# Patient Record
Sex: Female | Born: 1945 | Race: Black or African American | Hispanic: No | Marital: Single | Smoking: Former smoker
Health system: Southern US, Community
[De-identification: ages and names within clinical notes are randomized; demographics above are authoritative.]

## PROBLEM LIST (undated history)

## (undated) DIAGNOSIS — J309 Allergic rhinitis, unspecified: Secondary | ICD-10-CM

## (undated) DIAGNOSIS — D869 Sarcoidosis, unspecified: Secondary | ICD-10-CM

## (undated) DIAGNOSIS — K2961 Other gastritis with bleeding: Secondary | ICD-10-CM

## (undated) DIAGNOSIS — I82409 Acute embolism and thrombosis of unspecified deep veins of unspecified lower extremity: Secondary | ICD-10-CM

## (undated) DIAGNOSIS — E119 Type 2 diabetes mellitus without complications: Secondary | ICD-10-CM

## (undated) DIAGNOSIS — J90 Pleural effusion, not elsewhere classified: Secondary | ICD-10-CM

## (undated) DIAGNOSIS — F419 Anxiety disorder, unspecified: Secondary | ICD-10-CM

## (undated) DIAGNOSIS — G629 Polyneuropathy, unspecified: Secondary | ICD-10-CM

## (undated) DIAGNOSIS — I10 Essential (primary) hypertension: Secondary | ICD-10-CM

## (undated) DIAGNOSIS — D8689 Sarcoidosis of other sites: Secondary | ICD-10-CM

## (undated) HISTORY — PX: ABDOMINAL HYSTERECTOMY: SHX81

## (undated) HISTORY — DX: Allergic rhinitis, unspecified: J30.9

## (undated) HISTORY — DX: Pleural effusion, not elsewhere classified: J90

## (undated) HISTORY — PX: COLONOSCOPY: SHX174

## (undated) HISTORY — DX: Sarcoidosis of other sites: D86.89

## (undated) HISTORY — DX: Polyneuropathy, unspecified: G62.9

## (undated) HISTORY — PX: ESOPHAGOGASTRODUODENOSCOPY: SHX1529

## (undated) HISTORY — DX: Sarcoidosis, unspecified: D86.9

## (undated) HISTORY — DX: Acute embolism and thrombosis of unspecified deep veins of unspecified lower extremity: I82.409

---

## 1973-06-19 HISTORY — PX: CHOLECYSTECTOMY: SHX55

## 1999-09-14 ENCOUNTER — Ambulatory Visit (HOSPITAL_BASED_OUTPATIENT_CLINIC_OR_DEPARTMENT_OTHER): Admission: RE | Admit: 1999-09-14 | Discharge: 1999-09-14 | Payer: Self-pay | Admitting: Urology

## 1999-09-14 ENCOUNTER — Encounter (INDEPENDENT_AMBULATORY_CARE_PROVIDER_SITE_OTHER): Payer: Self-pay | Admitting: *Deleted

## 2000-03-14 ENCOUNTER — Other Ambulatory Visit: Admission: RE | Admit: 2000-03-14 | Discharge: 2000-03-14 | Payer: Self-pay | Admitting: Radiology

## 2000-03-14 ENCOUNTER — Encounter (INDEPENDENT_AMBULATORY_CARE_PROVIDER_SITE_OTHER): Payer: Self-pay | Admitting: Specialist

## 2001-06-06 ENCOUNTER — Encounter: Admission: RE | Admit: 2001-06-06 | Discharge: 2001-06-06 | Payer: Self-pay | Admitting: Internal Medicine

## 2001-06-06 ENCOUNTER — Encounter: Payer: Self-pay | Admitting: Internal Medicine

## 2001-07-23 ENCOUNTER — Encounter: Admission: RE | Admit: 2001-07-23 | Discharge: 2001-07-23 | Payer: Self-pay | Admitting: Internal Medicine

## 2001-07-23 ENCOUNTER — Encounter: Payer: Self-pay | Admitting: Internal Medicine

## 2001-07-24 ENCOUNTER — Encounter (INDEPENDENT_AMBULATORY_CARE_PROVIDER_SITE_OTHER): Payer: Self-pay | Admitting: Specialist

## 2001-07-24 ENCOUNTER — Encounter (INDEPENDENT_AMBULATORY_CARE_PROVIDER_SITE_OTHER): Payer: Self-pay | Admitting: *Deleted

## 2001-07-24 ENCOUNTER — Inpatient Hospital Stay (HOSPITAL_COMMUNITY): Admission: AD | Admit: 2001-07-24 | Discharge: 2001-08-06 | Payer: Self-pay | Admitting: Internal Medicine

## 2001-07-25 ENCOUNTER — Encounter: Payer: Self-pay | Admitting: Internal Medicine

## 2001-07-27 ENCOUNTER — Encounter: Payer: Self-pay | Admitting: Internal Medicine

## 2001-07-30 ENCOUNTER — Encounter: Payer: Self-pay | Admitting: Internal Medicine

## 2001-07-31 ENCOUNTER — Encounter: Payer: Self-pay | Admitting: Internal Medicine

## 2001-08-05 ENCOUNTER — Encounter: Payer: Self-pay | Admitting: Internal Medicine

## 2001-10-03 ENCOUNTER — Inpatient Hospital Stay (HOSPITAL_COMMUNITY): Admission: EM | Admit: 2001-10-03 | Discharge: 2001-10-07 | Payer: Self-pay | Admitting: Emergency Medicine

## 2001-10-03 ENCOUNTER — Encounter: Payer: Self-pay | Admitting: Internal Medicine

## 2001-10-04 ENCOUNTER — Encounter: Payer: Self-pay | Admitting: Internal Medicine

## 2002-11-11 ENCOUNTER — Ambulatory Visit (HOSPITAL_COMMUNITY): Admission: RE | Admit: 2002-11-11 | Discharge: 2002-11-11 | Payer: Self-pay | Admitting: Internal Medicine

## 2002-12-26 ENCOUNTER — Encounter: Payer: Self-pay | Admitting: Internal Medicine

## 2002-12-26 ENCOUNTER — Ambulatory Visit (HOSPITAL_COMMUNITY): Admission: RE | Admit: 2002-12-26 | Discharge: 2002-12-26 | Payer: Self-pay | Admitting: *Deleted

## 2004-09-13 ENCOUNTER — Ambulatory Visit: Payer: Self-pay | Admitting: Internal Medicine

## 2004-11-08 ENCOUNTER — Ambulatory Visit: Payer: Self-pay | Admitting: Internal Medicine

## 2005-03-22 ENCOUNTER — Inpatient Hospital Stay (HOSPITAL_COMMUNITY): Admission: AD | Admit: 2005-03-22 | Discharge: 2005-03-26 | Payer: Self-pay | Admitting: Internal Medicine

## 2005-04-07 ENCOUNTER — Ambulatory Visit: Payer: Self-pay | Admitting: Internal Medicine

## 2005-04-18 ENCOUNTER — Ambulatory Visit: Payer: Self-pay | Admitting: Cardiology

## 2005-05-09 ENCOUNTER — Ambulatory Visit: Payer: Self-pay | Admitting: Internal Medicine

## 2005-07-12 ENCOUNTER — Ambulatory Visit: Payer: Self-pay | Admitting: Internal Medicine

## 2005-07-21 ENCOUNTER — Ambulatory Visit: Payer: Self-pay | Admitting: Internal Medicine

## 2005-11-06 ENCOUNTER — Ambulatory Visit: Payer: Self-pay | Admitting: Internal Medicine

## 2006-05-07 ENCOUNTER — Ambulatory Visit: Payer: Self-pay | Admitting: Internal Medicine

## 2006-05-28 ENCOUNTER — Ambulatory Visit: Payer: Self-pay | Admitting: Cardiology

## 2006-05-30 ENCOUNTER — Ambulatory Visit: Payer: Self-pay

## 2006-06-05 ENCOUNTER — Ambulatory Visit: Payer: Self-pay

## 2006-06-27 ENCOUNTER — Ambulatory Visit: Payer: Self-pay | Admitting: Cardiology

## 2006-07-02 ENCOUNTER — Ambulatory Visit: Payer: Self-pay | Admitting: Internal Medicine

## 2006-08-03 ENCOUNTER — Emergency Department (HOSPITAL_COMMUNITY): Admission: EM | Admit: 2006-08-03 | Discharge: 2006-08-04 | Payer: Self-pay | Admitting: Emergency Medicine

## 2006-08-13 ENCOUNTER — Ambulatory Visit: Payer: Self-pay | Admitting: Internal Medicine

## 2006-10-08 ENCOUNTER — Ambulatory Visit: Payer: Self-pay | Admitting: Internal Medicine

## 2006-11-20 ENCOUNTER — Ambulatory Visit: Payer: Self-pay | Admitting: Internal Medicine

## 2007-03-15 ENCOUNTER — Ambulatory Visit: Payer: Self-pay | Admitting: Internal Medicine

## 2007-04-19 ENCOUNTER — Ambulatory Visit: Payer: Self-pay | Admitting: Internal Medicine

## 2007-07-18 DIAGNOSIS — Z86718 Personal history of other venous thrombosis and embolism: Secondary | ICD-10-CM | POA: Insufficient documentation

## 2007-07-18 DIAGNOSIS — J3089 Other allergic rhinitis: Secondary | ICD-10-CM

## 2007-07-18 DIAGNOSIS — D869 Sarcoidosis, unspecified: Secondary | ICD-10-CM | POA: Insufficient documentation

## 2007-07-18 DIAGNOSIS — G609 Hereditary and idiopathic neuropathy, unspecified: Secondary | ICD-10-CM | POA: Insufficient documentation

## 2007-07-18 DIAGNOSIS — I82409 Acute embolism and thrombosis of unspecified deep veins of unspecified lower extremity: Secondary | ICD-10-CM | POA: Insufficient documentation

## 2007-07-18 DIAGNOSIS — J302 Other seasonal allergic rhinitis: Secondary | ICD-10-CM | POA: Insufficient documentation

## 2007-07-18 DIAGNOSIS — J90 Pleural effusion, not elsewhere classified: Secondary | ICD-10-CM | POA: Insufficient documentation

## 2007-07-19 ENCOUNTER — Ambulatory Visit: Payer: Self-pay | Admitting: Internal Medicine

## 2007-09-03 ENCOUNTER — Ambulatory Visit: Payer: Self-pay | Admitting: Internal Medicine

## 2007-09-04 LAB — CONVERTED CEMR LAB
BUN: 10 mg/dL (ref 6–23)
Basophils Absolute: 0 10*3/uL (ref 0.0–0.1)
Basophils Relative: 0.8 % (ref 0.0–1.0)
CO2: 31 meq/L (ref 19–32)
Calcium: 9.2 mg/dL (ref 8.4–10.5)
Chloride: 99 meq/L (ref 96–112)
Cortisol, Plasma: 9.2 ug/dL
Creatinine, Ser: 0.8 mg/dL (ref 0.4–1.2)
Eosinophils Absolute: 0.3 10*3/uL (ref 0.0–0.6)
Eosinophils Relative: 4.9 % (ref 0.0–5.0)
GFR calc Af Amer: 93 mL/min
GFR calc non Af Amer: 77 mL/min
Glucose, Bld: 129 mg/dL — ABNORMAL HIGH (ref 70–99)
HCT: 36 % (ref 36.0–46.0)
Hemoglobin: 11.7 g/dL — ABNORMAL LOW (ref 12.0–15.0)
Lymphocytes Relative: 26.4 % (ref 12.0–46.0)
MCHC: 32.6 g/dL (ref 30.0–36.0)
MCV: 85.8 fL (ref 78.0–100.0)
Monocytes Absolute: 0.5 10*3/uL (ref 0.2–0.7)
Monocytes Relative: 9.2 % (ref 3.0–11.0)
Neutro Abs: 3.2 10*3/uL (ref 1.4–7.7)
Neutrophils Relative %: 58.7 % (ref 43.0–77.0)
Platelets: 372 10*3/uL (ref 150–400)
Potassium: 4 meq/L (ref 3.5–5.1)
RBC: 4.2 M/uL (ref 3.87–5.11)
RDW: 12.8 % (ref 11.5–14.6)
Sodium: 136 meq/L (ref 135–145)
WBC: 5.5 10*3/uL (ref 4.5–10.5)

## 2007-11-12 ENCOUNTER — Ambulatory Visit: Payer: Self-pay | Admitting: Internal Medicine

## 2007-11-20 ENCOUNTER — Telehealth (INDEPENDENT_AMBULATORY_CARE_PROVIDER_SITE_OTHER): Payer: Self-pay | Admitting: *Deleted

## 2008-01-03 ENCOUNTER — Ambulatory Visit: Payer: Self-pay | Admitting: Internal Medicine

## 2008-05-05 ENCOUNTER — Ambulatory Visit: Payer: Self-pay | Admitting: Internal Medicine

## 2008-11-03 ENCOUNTER — Ambulatory Visit: Payer: Self-pay | Admitting: Internal Medicine

## 2009-03-23 ENCOUNTER — Telehealth (INDEPENDENT_AMBULATORY_CARE_PROVIDER_SITE_OTHER): Payer: Self-pay | Admitting: *Deleted

## 2009-08-24 ENCOUNTER — Telehealth: Payer: Self-pay | Admitting: Internal Medicine

## 2009-09-09 ENCOUNTER — Telehealth (INDEPENDENT_AMBULATORY_CARE_PROVIDER_SITE_OTHER): Payer: Self-pay | Admitting: *Deleted

## 2009-11-03 ENCOUNTER — Ambulatory Visit: Payer: Self-pay | Admitting: Internal Medicine

## 2010-07-21 NOTE — Assessment & Plan Note (Signed)
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  Rhinitis.  She had a bronchitis syndrome.  Last visit.  Chest x-ray showed increased bibasilar effusions and airspace disease.  We gave her prednisone taper, starting June 3, lasting 10 days.  She feels much better.  Having completed that.  She denies any significant cough, chest pain, fever, sputum production, adenopathy, or rash.  05/05/08- Atypical sarcoid, rhinitis, hx pleural effusion Sensitive to weather but doing ok, feels well with no new problems. Off prednisone. We discussed Flu vaccines. Little cough, no wheeze, fever, phlegm or pain.  11/03/08- Atypical sarcoid, rhinits, hx pleural effusion. Notices some dyspnea with current humidity, but doing much better. Rarely needs rescue inhaler. Ophthalmologist is watching eye pressures closely. allegra as needed. Still gets sweats but denies fever, chills, nodes, cough, wheeze, chest pain, palpitation. Left foot swells a little sometimes.  Nov 03, 2009- Atypical sarcoid, rhinitis, hx pleural effusion Took Z pak, then 5 days of prednisone in March for chest congestion. She says these cleared her nicely. She thinks this was a bad cold, now resolved. Uses Proair about twice daily. She c/o rhinorhea and nasal congestion. Has used allegra, now interested in trying Clarinex. Keeps sweats chronically several times per day. Not much cough. Stable exertional dyspnea, no chest pain, no sputum, no wheeze. Feet no longer swell, but  still gets pains in lower legs and muscle twitches. She can walk.      Current Medications (verified): 1)  Cardizem La 180 Mg  Tb24 (Diltiazem Hcl Coated Beads) .... Take 1 By Mouth Once Daily 2)  Proair Hfa 108 (90 Base) Mcg/act Aers (Albuterol Sulfate) .... 2 Puffs Every 4 Hours As Needed 3)  Glucophage 500 Mg  Tabs (Metformin Hcl) .... Take 1 Tablet By Mouth Once A Day 4)  Prozac 10 Mg  Caps (Fluoxetine Hcl) .... Take 1 Tablet By Mouth Once A Day 5)  Fexofenadine Hcl 60 Mg  Tabs (Fexofenadine Hcl) .... Take 1 Tablet By Mouth Two Times A Day As Needed For Alleriges 6)  Clonidine Hcl 0.1 Mg  Tabs (Clonidine Hcl) .... Once Daily 7)  Calcium 500/vitamin D 500-125 Mg-Unit  Tabs (Calcium Carbonate-Vitamin D) .... Once Daily  Allergies (verified): 1)  ! 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, volume loss and effusion- bronchoscopy 2003 Peripheral neuropathy Positive PPD rx'd 2003 4 drugs by [facility] until all bronch afb studies returned negative DVT Right leg 2006 rhinosinusitis erythema nodosum 2008  Past Surgical History: Last updated: 07/19/2007 Cholecystectomy Hysterectomy C-section ruptured ovarian cyst  Family History: Last updated: 11/12/2007 MI/Heart Attack- father age 4 deceased CVA- mother age 26 stroke sister- 56 yrs- MS  Social History: Last updated: 11/12/2007 Patient states former smoker few cigs a day for 10 years  retired separated with 1 child  Risk Factors: Smoking Status: quit (07/19/2007)  Review of Systems      See HPI       The patient complains of shortness of breath with activity, non-productive cough, nasal congestion/difficulty breathing through nose, and sneezing.  The  patient denies shortness of breath at rest, productive cough, coughing up blood, chest pain, irregular heartbeats, acid heartburn, indigestion, loss of appetite,  weight change, abdominal pain, difficulty swallowing, sore throat, tooth/dental problems, and headaches.    Vital Signs:  Patient profile:   65 year old female Height:      63 inches Weight:      151 pounds BMI:     26.85 O2 Sat:      98 % on Room air Pulse rate:   66 / minute BP sitting:   110 / 78  (left arm) Cuff size:   regular  Vitals Entered By: Reynaldo Minium CMA (Nov 03, 2009 9:14 AM)  O2 Flow:  Room air  Physical Exam  Additional Exam:  General: A/Ox3; pleasant and cooperative, NAD, SKIN: no rash, lesions NODES: no lymphadenopathy HEENT: [place]/AT, EOM- WNL, Conjuctivae- clear, PERRLA, TM-WNL, Nose- clear, Throat- clear and wnl, Mallampati  II NECK: Supple w/ fair ROM, JVD- none, normal carotid impulses w/o bruits Thyroid-  CHEST: dull and shallow lung bases, without rales, rub, rhonchi.- unchanged from last exam. 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 She has stable chronic scarring bibasilar lung changes, but the inflammatorry component is no longer seen. She did have an acute bronchitis this Spring, c/w viral and now resolved. She does still find Proair helpful. We discussed use- she is borderline for needing a maintenance inhaler.  Problem # 2:  ALLERGIC RHINITIS (ICD-477.9)  She wants to see if she can do better with another antihistamine as Spring pollen rhinitis continues. We will try Clarinex. Her updated medication list for this problem includes:    Clarinex Reditabs 5 Mg Tbdp (Desloratadine) .Marland Kitchen... 1 daily as needed allergy  Medications Added to Medication List This Visit: 1)  Clarinex Reditabs 5 Mg Tbdp (Desloratadine) .Marland Kitchen.. 1 daily as needed allergy  Other Orders: Est. Patient Level III (45409) Prescription Created Electronically [phone])  Patient Instructions: 1)  Please schedule a  follow-up appointment in 1 year. Call sooner as needed. 2)  Script for Clarinex antihistamine sent to your drug store. 3)  Call for Proair renewal as needed. Prescriptions: CLARINEX REDITABS 5 MG TBDP (DESLORATADINE) 1 daily as needed allergy  #30 x prn   Entered and Authorized by:   Waymon Budge MD   Signed by:   Waymon Budge MD on 11/03/2009   Method used:   Electronically to        [id number] (retail)       [address]       [place], [place]  [address]       Ph: [phone] or [phone]       Fax: [phone]   RxID:   [id number]

## 2010-07-21 NOTE — Progress Notes (Signed)
Summary: congestion/ sob  Phone Note Call from Patient   Caller: Patient Call For: young Summary of Call: pt completed z pak still have chest congestion and sob would like to see dr young Initial call taken by: Rickard Patience,  September 09, 2009 10:03 AM  Follow-up for Phone Call        called and spoke with pt. pt was recently  given a z pak 08-24-2009 which she has finished.  Pt states she is also taking Mucinex.  Pt states she is overall feeling better but still c/o chest congestion, occ coughing up clear sputum, "rattling in chest" and difficulty taking a deep breath in.  Pt denied increased sob and  fever.  Pt requesting to be seen.  No appts avail.  Please advise.  Thanks.  Aundra Millet Reynolds LPN  September 09, 2009 11:39 AM    allergies: codeine  Additional Follow-up for Phone Call Additional follow up Details #1::        Offer prednisone 20 mg daily x 5 days, 20 mg tabs, # 5, No refill.    Additional Follow-up for Phone Call Additional follow up Details #2::    called and spoke with pt.  pt aware of CY's recs and rx sent to pharmacy.  Megan Reynolds LPN  September 09, 2009 2:05 PM   New/Updated Medications: PREDNISONE 20 MG TABS (PREDNISONE) Take 1 tablet by mouth once a day for 5 days Prescriptions: PREDNISONE 20 MG TABS (PREDNISONE) Take 1 tablet by mouth once a day for 5 days  #5 x 0   Entered by:   Arman Filter LPN   Authorized by:   Waymon Budge MD   Signed by:   Arman Filter LPN on 45/40/9811   Method used:   Electronically to        [id number] (retail)       [address]       [place], [place]  [address]       Ph: [phone] or [phone]       Fax: [phone]   RxID:   [id number]

## 2010-07-21 NOTE — Progress Notes (Signed)
Summary: rx  Phone Note Call from Patient Call back at Home Phone [phone]   Caller: Patient Call For: Mikel Pyon Reason for Call: Talk to Nurse Summary of Call: pt has chest congestion, drainage, no fever, need something to fix this. [facility] Initial call taken by: Eugene Gavia,  August 24, 2009 9:40 AM  Follow-up for Phone Call        called and spoke with pt.  pt states symptoms started as a "cold"  last week.  However, pt now c/o chest congestion with thick white sputum, difficulty coughing up sputum, PND,  and head congestion.  Pt denied sore throat or fever.  Pt states she is taking OTC Chloracetin with no relief of symptoms.  Please advise.  thank you.  Aundra Millet Reynolds LPN  August 25, 8467 9:54 AM allergies:  Codeine   Additional Follow-up for Phone Call Additional follow up Details #1::        Offer Z pak and suggest otc mucinex. Additional Follow-up by: Waymon Budge MD,  August 24, 2009 1:37 PM    Additional Follow-up for Phone Call Additional follow up Details #2::    pt advised. rx sent. Carron Curie CMA  August 24, 2009 2:04 PM   Prescriptions: ZITHROMAX Z-PAK 250 MG TABS (AZITHROMYCIN) take as directed  #1 x 0   Entered by:   Carron Curie CMA   Authorized by:   Waymon Budge MD   Signed by:   Carron Curie CMA on 08/24/2009   Method used:   Electronically to        [id number] (retail)       [address]       [place], [place]  [address]       Ph: [phone] or [phone]       Fax: [phone]   RxID:   [id number]

## 2010-11-01 NOTE — Assessment & Plan Note (Signed)
[facility]                             PULMONARY OFFICE NOTE   NAME:Brooke [name]                      MRN:          [id number]  DATE:04/19/2007                            DOB:          03-07-1946    PROBLEM LIST:  1. 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  2. History of pleural effusion.  3. Peripheral neuropathy.  4. Allergic rhinitis.  5. History of deep venous thrombosis.   HISTORY:  This disorder is still being called atypical sarcoid for lack  of a better diagnosis.  She feels she has been stable.  Glucose has been  up a little bit and apparently she is borderline now for diagnosis of  diabetes.  She is trying to exercise again.  Legs have not been  swelling.  She remains on prednisone 10 mg daily.   MEDICATIONS:  1. Cardizem LA 180 mg.  2. Prednisone 10 mg.  3. Tekturna.  4. She has a rescue albuterol inhaler and uses occasional albuterol 60      mg.   ALLERGIES:  CODEINE.   OBJECTIVE:  VITAL SIGNS:  Weight 161 pounds, blood pressure 128/80,  pulse 72, room air saturation 100%.  Breath sounds are diminished in the  bases.  Hyperpigmented at the ankles.   Chest x-ray on November 20, 2006, showed stable basilar scarring and left  apical scarring.  No active process, not changed from May of 2007.  Pulmonary function tests on March 15, 2007, showed moderate  restriction of total lung capacity at 58% of predicted, normal flows for  this lung volume with insignificant response to bronchodilator,  diffusion moderately reduced at 49% of predicted.   IMPRESSION:  Stable restrictive disorder.  Air flow is a little slower  compared with 2006 and I am not sure that is real.   PLAN:  Try reducing prednisone to 7.5 mg daily sensitive to her blood  sugar issues.  Okay to exercise as tolerated.  Flu vaccine discussed and  given.  Schedule return in three months, earlier  p.r.n.     Clinton D. Maple Hudson, MD, Tonny Bollman, FACP  Electronically Signed    CDY/MedQ  DD: 04/21/2007  DT: 04/22/2007  Job #: [id number]   cc:   Merlene Laughter. Renae Gloss, M.D.

## 2010-11-01 NOTE — Assessment & Plan Note (Signed)
[facility]                             PULMONARY OFFICE NOTE   NAME:[name] [name] HANKE                      MRN:          [id number]  DATE:11/20/2006                            DOB:          [birth year]    PROBLEM:  1. 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  2. History of pleural effusion.  3. Peripheral neuropathy.  4. Allergic rhinitis.  5. History of deep vein thrombosis.   HISTORY:  She says she feels okay, about the same with no symptom  changes, as prednisone maintenance was dropped to 7.5 mg daily in April.  Little cough.  No chest pain.  She notices some dyspnea if she walks  briskly.   MEDICATIONS:  1. Cardizem LA 180 mg.  2. Prednisone 7.5 mg.  3. __________ .  4. Albuterol p.r.n.  5. Allegra 60 mg.   DRUG INTOLERANT TO CODEINE.   OBJECTIVE:  Weight 155 pounds, which is stable.  BP 166/82.  Pulse 100.  Room air saturation 99%.  Breath sounds are clear, but shallow, dull in the bases as always with  no pleural rub.  No cough or wheeze.  HEART:  Sounds are regular without murmur or gallop.  There is no edema.  I do not find adenopathy.  Hyperpigmentation at her ankles with edema.   IMPRESSION:  Atypical sarcoid with peripheral neuropathy, probably  related.   PLAN:  Try reducing prednisone to 5 mg daily for maintenance.  We talked  again about longterm steroid therapy.  Chest x-ray and PFT are ordered.  Schedule return in 3 months, earlier p.r.n.     Clinton D. Maple Hudson, MD, Tonny Bollman, FACP  Electronically Signed    CDY/MedQ  DD: 11/22/2006  DT: 11/22/2006  Job #: [id number]   cc:   [name] [name]. [name] [name], M.D.  [name] [name], M.D.  [name] [name], M.D.  [name] Farber, MD

## 2010-11-04 NOTE — Assessment & Plan Note (Signed)
[facility]                             PULMONARY OFFICE NOTE   NAME:Wertheim, MINNAH LLAMAS                      MRN:          [id number]  DATE:08/13/2006                            DOB:          September 19, 1945    PROBLEM LIST:  1. 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  2. History of pleural effusion.  3. Peripheral neuropathy.  4. Allergic rhinitis.  5. History of deep venous thrombosis.   HISTORY:  She says she is not feeling badly today. The pain and swelling  in her legs have gone on maintenance prednisone at 10 mg daily. She had  GI bug which she got over. Chest has felt well.   MEDICATIONS:  1. Cardizem LA 180 mg.  2. Prednisone 10 mg.  3. Albuterol rescue inhaler used rarely.  4. Allegra 60 mg b.i.d.   OBJECTIVE:  VITAL SIGNS:  Weight 148 pounds, BP 124/62, pulse 96, room  air saturation 96%.  CHEST:  Quiet and I do not hear rales, rhonchi, wheeze or rub. I barely  appreciate dullness at the bases reflecting her basilar scarring.  HEART:  Sounds irregular without murmur. She looks comfortable. I find  no adenopathy, no edema.  EXTREMITIES:  Calves are thin and hyperpigmented.   IMPRESSION:  1. Atypical sarcoid.  2. 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 Chronic      restrictive physiology on pulmonary function testing.   PLAN:  We spent time again carefully discussion steroid side effects and  concerns. We will switch her to 5 mg tablets so that if she remains  stable at 10 mg daily, we will be able to start a  slow taper towards the lowest necessary dose. Schedule return in 2  months, earlier p.r.n. but in the meantime continue 10 mg daily.     Clinton D. Maple Hudson, MD, Tonny Bollman, FACP  Electronically Signed    CDY/MedQ  DD: 08/13/2006  DT: 08/13/2006  Job #: [id number]   cc:   Merlene Laughter. Renae Gloss, M.D.  Lemmie Evens, M.D.  Michael L. Thad Ranger, M.D.  Salvadore Farber, MD

## 2010-11-04 NOTE — Op Note (Signed)
. [facility]  Patient:    Brooke [name], Brooke [name]                      MRN: [id number] Proc. Date: 09/14/99 Adm. Date:  [id number] Attending:  Lauree Chandler                           Operative Report  PREOPERATIVE DIAGNOSIS:  Rule out interstitial cystitis.  POSTOPERATIVE DIAGNOSIS:  Interstitial cystitis and urethral stenosis.  PROCEDURE:  Cystoscopy, urethral dilation, hydraulic dilation of bladder, cold-cup bladder biopsy and fulguration of bladder.  INDICATIONS:  This 65 year old black female has had a long history of frequent urination and a bloated, uncomfortable feeling in the bladder.  She has had some partial relief with Ditropan XL.  She comes in for further evaluation.  SURGEON:  Maretta Bees. Vonita Moss, M.D.  PROCEDURE:  The patient was brought to the operating room and placed in lithotomy position.  External genitalia were prepped and draped in usual fashion.  The urethra was tight when I tried to pass the cystoscope, so she was dilated from 16-French to 30-French and was definitely stenotic.  I then inserted 25-French cystoscope.  The bladder was normal with no stones, tumors or inflammatory lesions. I dilated the bladder under hydraulic filling to 600 cc.  I looked back in and there were scattered submucosal petechiae across the bladder base.  I used the cold-cup bladder biopsy forceps to biopsy two representative hemorrhagic areas.  The biopsy sites were fulgurated.  The bladder was emptied, the scope removed and the patient sent to recovery room in good condition with no blood loss and she tolerated the procedure well. DD:  09/14/99 TD:  09/14/99 Job: [id number]

## 2010-11-04 NOTE — Assessment & Plan Note (Signed)
[facility]                             PULMONARY OFFICE NOTE   NAME:[name], Brooke [name]                      MRN:          [id number]  DATE:10/08/2006                            DOB:          [birth year]    PROBLEM:  1. 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  2. History of pleural effusion.  3. Peripheral neuropathy.  4. Allergic rhinitis.  5. History of deep vein thrombosis.   HISTORY:  She says she is doing okay except for her hypertension.  Swelling and feet has been fine.  Allegra p.r.n.  for mild pollen and  nasal congestion.  No cough, fever or chest pain.  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  She has  remained on prednisone 5 mg b.i.d., using ProAir rescue inhaler about  every 2 weeks.  Her other medication is Cardizem LA 180 mg.   OBJECTIVE:  Weight 155 pounds, which if correct is up 7 pounds since  February.  BP 182/92, pulse regular 71, room air saturation 99%.  Breath sounds are diminished in the bases but without rales, rhonchi,  wheeze or cough.  Neck veins are not distended. There is no strider.  HEART:  Sounds are regular.  I do not hear a murmur. No peripheral  edema.   IMPRESSION:  Atypical sarcoid with associated peripheral complications.   PLAN:  1. She is to follow with her primary physician about her blood      pressure control.  2. Reduce prednisone to take 1-1/2 of the 5 mg pills daily. We      refilled her ProAir inhaler.  3. Schedule return in 1 month, earlier p.r.n.     Clinton D. Maple Hudson, MD, Tonny Bollman, FACP  Electronically Signed    CDY/MedQ  DD: 10/08/2006  DT: 10/08/2006  Job #: [id number]   cc:   Merlene Laughter. Renae Gloss, M.D.  Lemmie Evens, M.D.  Michael L. Thad Ranger, M.D.  Salvadore Farber, MD

## 2010-11-04 NOTE — H&P (Signed)
Brooke [name]. [facility]  Patient:    Brooke [name], Brooke [name] Visit Number: [id number] MRN: [id number]          Service Type: MED Location: 5000 [id number] Attending Physician:  Jetty Duhamel Driver Dictated by:   Rennis Chris. Maple Hudson, M.D. Admit Date:  10/03/2001   CC:         5Kimberly R. Renae Gloss, M.D.   History and Physical  ADMITTING DIAGNOSES:  1. Acute nausea and vomiting, gastroenteritis syndrome.  2. Dehydration.  3. 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  4. History of positive purified protein derivative, status post six weeks     antituberculous therapy, culture negative.  HISTORY OF PRESENT ILLNESS: This 65 year old African-American woman presented to the emergency room complaining of nausea, vomiting, diarrhea, shortness of breath, and weakness since this morning.  She was well last night.  Made her own breakfast of sausage and egg biscuit this morning, and symptoms began later in the morning.  She has been on prednisone 20 mg q.d., tapered down from 60 mg q.d. following hospitalization at [facility] July 24, 2001 through August 06, 2001 with a granulomatous necrotizing bibasilar process associated with pleural effusions and loss of lung volume. Etiology still unclear.  ANCA and ANA titers were unremarkable.  Sedimentation rate was elevated.  She was placed on prednisone to cover possibility of a bronchiolitis obliterans with organizing pneumonia or vasculitis.  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  No history of active tuberculosis but because of the granulomatous lesions on transbronchial biopsy in February 2003 she was begun on antituberculous therapy with INR, Nethambutol, Rifampin, and pyrazinamide with B-6, monitored by the [facility] and discontinued after six weeks of  therapy when all cultures from thoracentesis and transbronchial biopsy in February had returned negative.  REVIEW OF SYSTEMS: Shortness of breath, thirsty.  No sputum.  No cough or chest pain.  No blood.  No high fever or chills.  Feels cool.  Thighs ache. Low back ache.  No dysuria.  No headache or confusion.  No trauma.  ALLERGIES: Intolerant to CODEINE.  PAST MEDICAL HISTORY: In addition to above,  1. Surgery for hysterectomy.     a. Hysterectomy.     b. Ruptured ovarian cyst.  2. Interstitial cystitis.  3. No history of heart, kidney, or diabetes.  4. Because of dry mouth there has been question of Sjogrens.  SOCIAL HISTORY: Quit cigarettes three years ago.  No ethanol.  Married.  FAMILY HISTORY: One daughter, 60 years old.  Sister with multiple sclerosis. Father died unknown causes.  Mother is living.  PHYSICAL EXAMINATION:  GENERAL: Slender, tired, ill-appearing woman, lying quietly.  VITAL SIGNS: Temperature 97 degrees, BP 86/69, heart rate 126, respirations 32.  Saturation 97% on room air.  SKIN: No rash.  Cool and dry.  ADENOPATHY: None found.  HEENT/NECK: Atraumatic.  No JVD or stridor.  Nose clear.  Speech clear. PERRLA.  No thyromegaly.  CHEST: Shallow breath sounds.  Few bibasilar crackles.  No rub, rales, wheeze, or cough.  HEART: Regular rhythm without murmur, gallop, or rub.  BREAST: Not examined.  ABDOMEN: Scaphoid, quiet.  No hepatosplenomegaly.  I did not probe hard for edge of liver, which was palpable at the right lower costal margin in February 2003.  GU/RECTAL: Deferred.  EXTREMITIES: No edema, cyanosis, or clubbing.  NEUROLOGIC: Weak  but moving all extremities.  Has been evaluated for peripheral neuropathy with numbness, unexplained.  IMPRESSION: Apparent acute gastroenteritis syndrome by timing strongly suspicious for a bacterial food poisoning.  I doubt relation to her granulomatous necrotizing lung process, which has appeared stable  on recent office evaluation.  That is, though, considered to be an atypical booth or sarcoid but remains unexplained.  There is dehydration with hypertension.  I doubt sepsis but we will culture, holding antibiotics at this point.  PLAN:  1. IV fluids.  2. Cultures.  3. Check chest x-ray and laboratories, compare with February 2003.  4. She is admitted to stabilize. Dictated by:   Rennis Chris. Maple Hudson, M.D. Attending Physician:  Jetty Duhamel Driver DD:  95/28/41 TD:  10/04/01 Job: [id number]

## 2010-11-04 NOTE — Discharge Summary (Signed)
Brooke [name]. [facility]  Patient:    Brooke [name], Brooke [name] Visit Number: [id number] MRN: [id number]          Service Type: MED Location: [phone] Attending Physician:  Jetty Duhamel Driver Dictated by:   Rennis Chris. Maple Hudson, M.D. Admit Date:  10/03/2001 Discharge Date: 10/07/2001   CC:         Andi Devon, M.D.  Syliva Overman, M.D.  Kelli Hope, M.D.   Discharge Summary  DISCHARGE DIAGNOSES: 1. Acute gastroenteritis. 2. Dehydration. 3. Granulomatous necrotizing pneumonitis with pleural effusions. 4. Hyperglycemia on steroids. 5. Transient hypotension. 6. Chronic steroid therapy.  BRIEF HISTORY: The patient is a 65 year old African-American woman who presented to the emergency room with nausea, vomiting, diarrhea and dyspnea acute onset.  She had been on prednisone for a granulomatous necrotizing bibasilar pulmonary process associated with pleural effusions and loss of lung volume which was evaluated in February by transbronchial biopsy.  The differential diagnosis has included bronchiolitis obliterans with organizing pneumonia.  She had completed six weeks of anti-tuberculous drug therapy because of a remotely acquired positive PPD.  When all cultures returned negative for  AFB, the anti-tuberculous drugs were stopped.  She has been negative on assays for angiotensin converting enzyme, antinuclear cytoplasmic antibody and antinuclear antigen with elevated sedimentation rate and her pulmonary process is being followed long-term.  PAST MEDICAL HISTORY: 1. Hysterectomy. 2. Ruptured ovarian cyst. 3. Interstitial cystitis. 4. Question of Sjogrens because of dry mouth.  DRUG ALLERGIES: Intolerant to CODEINE.  PHYSICAL EXAMINATION ON ADMISSION:  VITAL SIGNS:  Temperature 97.0, heart rate 126, blood pressure 86/69, saturation 97% on room air.  LUNGS:  Shallow breath sounds with a few crackles.  CARDIAC:  Heart sounds are  normal.  ABDOMEN:  Scaphoid and quiet.  HOSPITAL COURSE: The patient was felt to have an acute nonspecific gastroenteritis, possibly food poisoning from breakfast biscuit and sausage. She was treated with intravenous fluids and blood pressure and GI symptoms improved.  She was noted to have a leukocytosis not on antibiotics and this was attributed to her acute GI condition, her chronic lung condition and steroid therapy.  Cultures were obtained.  She was noted to have had the question of Sjogrens but did not demonstrate obvious dysphagia.  She was to be followed up as an outpatient by Dr. Jimmy Footman from Rheumatology to further evaluate this question. Hyperglycemia related to steroid therapy was covered with sliding scale insulin by protocol.  LABORATORY:  Chest x-ray showed patchy infiltrates at the bases with effusions bilaterally, right greater than left, with no change.  CT scan of the chest indicated some increased pleural thickening or loculated fluid posteriorly on the left, areas of consolidation and atelectasis in the left lower lobe had improved compared with February, there was slight increase in  pleural thickening on the right with increased thickening along the major fissure but the infiltrates in the right lower lobe had improved a little.  Patchy parenchymal infiltrates at the lung apices had very slightly progressed bilaterally.  Small nodular densities described on prior CT scan were less apparent on the current film.  A small epicardial node noted previously was no longer visible.  Heart size and vascularity were normal.  There was slight prominence of lymph nodes in the subcarinal region unchanged from the February exam.  The overall impression was slight improvement in the infiltrates, but slightly greater pleural thickening or loculated effusions, small.  Admission white blood count was 22,300, hemoglobin 11.9, platelet count 501,000.  By discharge white blood  count was 8,400, hemoglobin 11.1, 95% neutrophils, 4% lymphocytes, no eosinophils, increased bands.  Sedimentation rate 25. Chemistry panel:  Admission sodium 132 which improved to 135, admission potassium 2.9 which improved to 3.8, admission glucose 226 improved to 180, albumin 2.6.  Liver enzymes were normal.  Urinalysis was unremarkable. Blood cultures were negative.  Stool for pathogens was negative.  At final check glucose was 175 for follow-up and she is to address blood sugars with Dr. Renae Gloss.  She was afebrile.  Oxygen saturation was 98% on room air.  DISCHARGE MEDICATIONS:  Prednisone using 5 mg tablets to take 10 mg daily times three days, then 5 mg daily times three days, then stop.  ACTIVITY:  Unrestricted.  DIET:  Unrestricted.  OFFICE FOLLOW-UP: 1. Follow-up with Dr. Maple Hudson in two to three weeks. 2. She needs to keep scheduled appointments with Dr. Jimmy Footman for    Rheumatology, Dr. Andi Devon for primary care and Dr. Thad Ranger    for Neurology as planned.  Dictated by:   Rennis Chris. Maple Hudson, M.D.  Attending Physician:  Jetty Duhamel Driver DD:  02/72/53 TD:  10/27/01 Job: [id number]

## 2010-11-04 NOTE — Discharge Summary (Signed)
Brooke [name], Brooke [name]               ACCOUNT NO.:  [id number]   MEDICAL RECORD NO.:  [id number]          PATIENT TYPE:  INP   LOCATION:  6741                         FACILITY:  [facility]   PHYSICIAN:  Lonia Blood, M.D.DATE OF BIRTH:  1945/12/28   DATE OF ADMISSION:  03/21/2005  DATE OF DISCHARGE:  03/26/2005                                 DISCHARGE SUMMARY   PRIMARY CARE PHYSICIAN:  Cala Bradford R. Renae Gloss, M.D.   DISCHARGE DIAGNOSES:  1.  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      1.  A CT scan negative for evidence of pulmonary embolism.      2.  Lovenox bridging to Coumadin therapy.      3.  Hypercoagulable workup negative initially but will need to rechecked          after completion of Coumadin therapy.      4.  Felt to most likely be secondary to hormone replacement therapy.  2.  Hormone replacement therapy.  The patient has been advised to      discontinue hormone replacement therapy.  3.  Depression.  4.  Hypertension.  5.  Atypical sarcoidosis.  6.  Reported allergy to codeine causing cramps.   DISCHARGE MEDICATIONS:  1.  Coumadin 4 mg q.p.m. at 6 o'clock.  2.  Zoloft 25 mg daily.  3.  Diovan HCT 160/25 daily.  4.  The patient is advised specifically to discontinue Premarin and to avoid      other types of hormone replacement therapy.   FOLLOW UP:  The patient is advised to follow up in the office of Kimberly R.  Renae Gloss, M.D. on March 28, 2005 for recheck of her INR.   During this hospital stay was dosed with Coumadin at 7.5 mg x2, and then 4  mg x2.  The INR on March 24, 2005 was 1.8, and on March 25, 2005 had  improved to 2.5.  Consideration should br given to repeating a  hypercoagulable workup in the outpatient setting as detailed below.   CONSULTATIONS:  None.   PROCEDURES:  A CT scan of the chest with contrast:  No evidence for acute  pulmonary emboli.  Biapical and bilateral lower lobe patchy consolidation  consistent with clinical  history of pulmonary sarcoid.  Stable mild bihilar  lymphadenopathy.   HISTORY OF PRESENT ILLNESS:  For a complete history of present illness,  please see full H&P per Michaelyn Barter, M.D., titled [phone].   HOSPITAL COURSE:  [name] is a 65 year old female with a minimal  medical history as detailed above.  She presented to the hospital on March 21, 2005, after outpatient evaluation had led to a diagnosis or right lower  extremity DVT.  The patient reported pain and swelling in her right leg for  approximately 48 hours prior to visiting Dr. Renae Gloss which prompted the  Doppler exam.  Because of complaints of some chest pressure accompanying her  right lower extremity pain, a decision was made that the most appropriate  therapy would be to admit the patient to the hospital for rule out a  PE and  to initiate anticoagulation.  The patient was placed on full dose Lovenox  therapy, and Coumadin therapy was also initiated, with both being directed  by the pharmacy.  A CT scan of the chest was obtained and failed to reveal  any evidence of acute pulmonary emboli.  The patient's complaints of chest  pressure resolved within 24 hours.  To initiate workup, antithrombin-III,  protein C, and protein S as well as lupus anticoagulant were all obtained  during the hospital stay.  Though these values can be skewed in the acute  setting, there is value to normal levels obtained even acutely.  AT-III  level and protein C level were both normal during this hospitalization and,  therefore, this effectively rules out deficiencies involving these levels.  Protein S, however, was low.  In the acute phase, you can see a decrease in  this level which is not actually reflective of a true protein S deficiency.  Therefore, it is recommended that protein S be rechecked after the patient  has completed a full six-month course of anticoagulation, and Coumadin is  able to be discontinued.  Likewise, lupus  anticoagulant was obtained and was  mildly elevated.  The significance of this in the acute phase is not clear.  It is recommended that this be rechecked again after the initial six-month.  If this remains positive, then lifelong anticoagulation will need to be  considered.  During the patient's hospital stay, she tolerated both Lovenox  and Coumadin without any difficulty.  Hemoglobin was followed closely and  remained stable throughout the hospital stay.  The #1 identifiable risk  factor for the patient's DVTs is her hormone replacement therapy.  She was  educated as to the link between hormone replacement therapy and DVT  formation.  She was advised the she should not resume her hormone  replacement therapy.   The remainder of Ms. Maille medical problems remained stable throughout her  hospitalization.  Blood pressure remained well controlled on her outpatient  regimen.  No further adjustments were made in her medication treatment plan.  After such time that INR was within the therapeutic range for 48 hours with  overlap of Lovenox, the Lovenox was able to be discontinued, and the patient  was cleared for discharge home.   FOLLOW UP:  As discussed above with Merlene Laughter. Renae Gloss, M.D., the patient's  primary care physician.      Lonia Blood, M.D.  Electronically Signed     JTM/MEDQ  D:  03/25/2005  T:  03/25/2005  Job:  [id number]   cc:   Merlene Laughter. Renae Gloss, M.D.  Fax: [phone]

## 2010-11-04 NOTE — Progress Notes (Signed)
[facility]                        PERIPHERAL VASCULAR OFFICE NOTE   NAME:Woodberry, Brooke BIA                      MRN:          [id number]  DATE:06/27/2006                            DOB:          1945-11-29    HISTORY OF PRESENT ILLNESS:  Brooke [name] is a 65 year old lady with a  history of right leg deep venous thrombus in 2006 and atypical sarcoid  diagnosed in 2003.  She was found to have culture-negative noncaseating  granulomas on lung biopsy but has been A serology negative.   I met her in early December for evaluation of 6 months of pins-and-  needles sensation in predominantly the right leg.  This has resolved but  she has discomfort in her left ankle and just below the ankle that has  been present for several weeks.  This is essentially constant and not  clearly worse with exertion, though it does hurt some more when she  stands on it.  There is no discomfort in the musculature of the calf.  The symptoms on the right have improved substantially since I saw her a  month ago.   CURRENT MEDICATIONS:  1. Cardizem LA 180 mg per day.  2. Aspirin 81 mg per day.  3. Ibuprofen 400 mg in the morning and 200 mg at noon and the evening.   PHYSICAL EXAMINATION:  GENERAL:  She is generally well-appearing in no  distress with heart rate 68, blood pressure 162/84 and equal  bilaterally.  SKIN:  Remarkable for several lesions of erythema nodosum on both shins.  These are  substantially tender.  Remainder of skin exam is normal.  NECK:  She has no jugular venous distention and no thyromegaly.  LUNGS:  Clear to auscultation.  HEART:  She has a nondisplaced point of maximal cardiac impulse.  There  is a regular rate and rhythm without murmur, rub or gallop.  ABDOMEN:  Soft, nondistended, nontender.  There is no  hepatosplenomegaly.  Bowel sounds are normal.  EXTREMITIES:  Warm with the erythema nodosum and some diffuse nonpitting  edema bilaterally.  MUSCULOSKELETAL: No effusions at either ankle joint.   IMPRESSION/RECOMENDATION: Her ABIs are 0.83 bilateral with toe/brachial  indices approximately 0.5 bilaterally.  This represents mild arterial  insufficiency.  It is clearly not severe enough to be responsible for  the discomfort that she has at rest and with minimal exertion.  Furthermore, her symptoms are atypical for those of arterial  insufficiency.  Thus, I think her peripheral arterial disease, while  real, is not contributing to her pain.   Instead, I am struck by the localization of the pain to the ankle, its  migratory nature, and the active erythema nodosum in the setting of her  atypical sarcoid.  As I discussed with Dr. Maple Hudson over the phone today,  I am concerned that this may represent Lofgren's syndrome.  Per  recommendation of Dr. Maple Hudson, will initiate ibuprofen 800 mg three times  per day in addition to  her aspirin.  She will then follow up with Dr. Maple Hudson within the next  week or two.  His office will  contact her.     Salvadore Farber, MD  Electronically Signed    WED/MedQ  DD: 06/27/2006  DT: 06/27/2006  Job #: [id number]   cc:   Joni Fears D. Maple Hudson, MD, FCCP, FACP

## 2010-11-04 NOTE — Assessment & Plan Note (Signed)
[facility]                             PULMONARY OFFICE NOTE   NAME:Kussman, TRINDA HARLACHER                      MRN:          [id number]  DATE:07/02/2006                            DOB:          1945-07-03    PROBLEMS:  1. Pulmonary infiltrates with granulomas/necrotizing pneumonitis      (angiotensin-converting enzyme negative, atypical sarcoid).  2. History of pleural effusion.  3. Peripheral neuropathy.  4. Allergic rhinitis.  5. History of deep vein thrombosis.   HISTORY:  She was evaluated for possible peripheral arterial  insufficiency by Dr. Samule Ohm who suggested the possibility of Lofgren's  syndrome related to her presumptive diagnosis of ACE-negative sarcoid.  A Z-Pak cleared a transient bronchitic syndrome last month.  Currently  she is feeling very stable with no fever, chills, adenopathy or acute  events.  She is not dyspneic at rest and exercise tolerance is stable.   MEDICATION:  1. Cardizem LA 180 mg.  2. Albuterol rescue inhaler.  3. Allegra 60 mg b.i.d. p.r.n.   Drug intolerant of CODEINE.   OBJECTIVE:  Weight 151 pounds, BP 124/90, pulse regular at 75, room air  saturation 99% at rest.  She looks quite comfortable.  There is no nasal  congestion or postnasal drainage, no neck vein distention or stridor.  I  do not find adenopathy at the neck, shoulders or axillae.  Her chest is  quiet and clear with no extra sounds.  There may be minimal dullness at  the bases without pleural rub.  Heart sounds are regular without murmur.  Her left foot may be a trace edematous but not much.   Dr. Melinda Crutch office note is reviewed.   IMPRESSION:  Sarcoid is the best working diagnosis we have had, despite  the negative ACE level.  It does not seem to be active, unless the leg  pain is related.  We discussed a few possible diagnostic considerations  for peripheral leg pain and neuropathy, recognizing that Dr. Thad Ranger  and Dr. Samule Ohm could do  better than what I am able to offer.  We agreed  we would try maintenance control at modest-dose prednisone to see if we  could make a difference.  She has not been on prednisone in over a year-  and-a-half.   PLAN:  Prednisone 10-mg tablets to take 20 mg daily for 7 days, then 10  mg daily.  Schedule return 6 weeks, earlier p.r.n.     Clinton D. Maple Hudson, MD, Tonny Bollman, FACP  Electronically Signed    CDY/MedQ  DD: 07/02/2006  DT: 07/03/2006  Job #: [id number]   cc:   Merlene Laughter. Renae Gloss, M.D.  Lemmie Evens, M.D.  Michael L. Thad Ranger, M.D.  Salvadore Farber, MD

## 2010-11-04 NOTE — H&P (Signed)
[place]. [facility]  Patient:    TOMARA, YOUNGBERG Visit Number: [id number] MRN: [id number]          Service Type: MED Location: [phone] Attending Physician:  Jetty Duhamel Driver Dictated by:   Rennis Chris. Maple Hudson, M.D. Admit Date:  07/24/2001   CC:         Merlene Laughter. Renae Gloss, M.D.   History and Physical  ADMITTING DIAGNOSES: 1. Pulmonary infiltrates with fever. 2. Pleural effusion.  HISTORY:  [age]-year-old black female, former smoker, with remote history of positive PPD, admitted with complaints of shortness of breath, cough and malaise.  Dr. Merlene Laughter. Renae Gloss is her primary physician.  She has a past history of bronchitis but presented to me giving history that around the first of December, she noted dry mouth and dry eyes.  There was a question of Sjogrens syndrome without reflux.  About two weeks later, she had onset of cough, wheeze, nasal congestion, dyspnea and orthopnea with bilateral tussive rib soreness and recognition of a few pounds of weight loss.  Chest x-ray, June 13, 2001, showed bibasilar infiltrates and small effusions.  This was treated as pneumonia with Levaquin for seven days.  Followup chest x-ray, January 2nd, showed little change but she was feeling much better.  Then, February 3rd, she noted increased malaise and cough.  Shortness of breath was more troublesome.  A CBC was obtained showing WBC 14,100 with hemoglobin of 10.8 and sedimentation rate of 79.  ANA and ACE level are still pending.  A CT scan of the chest showed multiple bilateral nodules, metastases versus granulomas without calcification, and some areas of possible focal infection with bilateral lower lobe atelectasis and probable pneumonia, small pleural effusions.  The CT scan was negative for pulmonary embolism.  With persistent malaise, she is admitted now for evaluation and workup.  REVIEW OF SYSTEMS:  Still bothered by dry mouth and has been  using an over-the-counter artificial saliva, feverish without distinct chills or sweats, weight down about 10 pounds.  Has been eating poorly since Thanksgiving but taking Boost with each meal.  Short of breath with any activity and sleeping on two to three pills.  Cough producing scant white, nonbloody, non-purulent sputum.  Has had some nasal congestion and a CT scan earlier this winter showed some mucosal thickening but was otherwise nonspecific.  No reflux or dysphagia, nausea, vomiting or change in bowel or bladder habit and no dysuria.  Thighs were aching but that is now better.  Has not had joint pain or stiffness and has not had ankle edema.  MEDICATIONS:  Premarin 1.25 mg q.d.  DRUG INTOLERANCE:  CODEINE causes GI upset.  FAMILY HISTORY:  Father died of unknown cause, mother is living, sister has multiple sclerosis.  SOCIAL HISTORY:  Quit smoking three years ago.  No alcohol.  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  PHYSICAL EXAMINATION:  VITAL SIGNS:  Temperature 100.9, pulse 105, BP 128/74, room air oxygen saturation 97%, mild tachypnea, about 29 per minute.  GENERAL APPEARANCE:  Weak, tired, alert, oriented, slender woman.  SKIN:  Quite warm, rather dry.  There are some brownish eczematoid spots on her legs but not rash or excoriation.  ADENOPATHY:  Question a little fullness in the left axilla, otherwise, none found.  HEENT:  Atraumatic.  Conjunctivae are clear.  Pupils are equal and normo-reactive.  NECK:  No JVD or stridor.  No cervical adenopathy.  No thyromegaly.  CHEST:  Rhonchi laterally, left greater than right.  A little dullness in the bases.  No pleural rub.  No rales or wheeze; she is not coughing right now.  BREASTS:  Without discharge, dominant mass or retraction.  HEART:  Regular rhythm without murmur or gallop.  ABDOMEN:  Liver edge is down about one fingerbreadth at  the right costal margin, nontender.  Bowel sounds are present.  I cannot feel the spleen.  GU, RECTAL AND GYN:  Not done, not indicated.  EXTREMITIES:  Joints do not appear stiff.  There is no crepitus or obvious heat.  Legs are trim without edema, cords, cyanosis or clubbing.  RADIOLOGY:  Chest film in my office had shown bilateral basilar infiltrates and small effusions.  PAST HISTORY:  A remote episode of sinusitis.  Two episodes of pneumonia without pneumococcal vaccine.  Previous treatment for bronchitis.  Exposure to tuberculosis when she worked in a laboratory and converted PPD to positive but never treated, never felt to have had active tuberculosis.  No history of DVT or pulmonary embolism.  No history of heart disease, hypertension, diabetes, reflux or esophageal stricture.  Surgeries for gallbladder, hysterectomy, ruptured ovary and a benign breast biopsy.  IMPRESSION: 1. The combination of pulmonary infiltrates, effusion, leukocytosis and fever,    despite one week of Levaquin, still suggests a pneumonia process.  We will    culture her and start Rocephin and Zithromax but I suspect we are going to    need bronchoscopy and possibly, if the effusions get big enough,    thoracentesis. 2. The nodules seen on CT scan are very nonspecific and we will reassess them    as her status progresses. 3. The question of early Sjogrens syndrome is of unclear significance.  I do    not see other evidence of a serositis.  An ANA is pending.  Acute    inflammatory sarcoid is very unlikely but an angiotensin-converting enzyme    level was submitted earlier and is pending.  Her remote history of positive    purified protein derivative raises concern of tuberculosis but the acute    appearance with fever and small bilateral pleural effusions would be    somewhat unusual presentation. Dictated by:   Rennis Chris. Maple Hudson, M.D. Attending Physician:  Jetty Duhamel Driver DD:  04/54/09 TD:   07/25/01 Job: [id number]

## 2010-11-04 NOTE — H&P (Signed)
Brooke [name], Brooke [name]               ACCOUNT NO.:  [id number]   MEDICAL RECORD NO.:  [id number]          PATIENT TYPE:  EMS   LOCATION:  ED                           FACILITY:  [facility]   PHYSICIAN:  Michaelyn Barter, M.D. DATE OF BIRTH:  [birth year]   DATE OF ADMISSION:  03/21/2005  DATE OF DISCHARGE:                                HISTORY & PHYSICAL   PRIMARY CARE PHYSICIAN:  Dr. Andi [name]   HISTORY OF PRESENT ILLNESS:  Brooke [name] is a 65 year old female with past  medical history of hypertension and atypical sarcoidosis.  She was sent from  her primary care physician's office, Brooke [name], as a direct admission  for treatment of a right lower extremity DVT.  The patient states that she  had developed pain and swelling in the right leg a while back however over  the past couple of days the pain has increased.  She had an ultrasound done  today.  It was positive for DVT.  She has some baseline shortness of breath  secondary to sarcoidosis but no progression of her shortness of breath.  Likewise, she denies having any chest pain.   PAST MEDICAL HISTORY:  1.  Depression.  2.  Hypertension.  3.  Atypical sarcoidosis which was diagnosed in 2004.  4.  History of interstitial cystitis.   PAST SURGICAL HISTORY:  Hysterectomy secondary to a ruptured left ovary.   DRUG ALLERGIES:  CODEINE produces abdominal cramping.   HOME MEDICATIONS:  1.  Diovan HCT 160/25 mg one tablet p.o. once daily.  2.  Zoloft 50 mg.  3.  Premarin 1.25 mg one tablet p.o. once daily.   FAMILY HISTORY:  The patient's mother had a CVA and has history of  hypertension.  The patient's father is unknown.   SOCIAL HISTORY:  Cigarettes:  The patient started smoking at the age of 70.  She smoked approximately four sticks of cigarettes per day.  She stopped  smoking cigarettes approximately 6-7 years ago.  Alcohol:  The patient  drinks socially.  Street drugs:  The patient denies.   PHYSICAL EXAMINATION:   GENERAL:  The patient is awake.  She is cooperative.  She does not appear to be in any obvious distress.  VITALS:  Temperature is 100.4, blood pressure 142/90, heart rate 96,  respirations 20, O2 saturation 98% on room air.  HEENT:  Normocephalic, atraumatic.  Pupils are anicteric.  Extraocular  movements are intact.  Pupils are equally reactive to light.  Oral mucosa is  pink, moist, no exudates, no thrush.  Neck is supple, no lymphadenopathy,  thyroid not palpable.  CARDIOVASCULAR:  S1-S2 is present.  Regular rate and rhythm.  No S3, no S4.  No murmurs, no gallops, no rubs.  RESPIRATORY:  Lungs are clear.  No crackles.  No wheezes.  ABDOMEN:  There is a right upper quadrant well-healed scar.  Abdomen is  soft, nontender, nondistended, positive bowel sounds x4 quadrants.  EXTREMITIES:  No lower extremity edema.  MUSCULOSKELETAL:  5/5 bilateral upper extremity strength, 5/5 left leg  strength, right leg strength is reduced secondary to pain  upon movement.  NEUROLOGICALLY:  The patient is alert and oriented x3.  Cranial nerves II-  XII are intact.   LABORATORIES:  There are no current labs available.   ASSESSMENT AND PLAN:  1.  Deep venous thrombosis of the right lower extremity.  We will start the      patient on Lovenox subcutaneous.  We will then initiate Coumadin      therapy.  We will provide pain medication for the patient on a p.r.n.      basis.  2.  Hypertension.  We will resume the patient's prior home medications of      Diovan HCT.  3.  Depression.  Currently appears to be stable.  We will resume the      patient's prior Zoloft medications.  4.  History of sarcoidosis.  Currently appears to be stable.  We will      continue to monitor this for now.      Michaelyn Barter, M.D.  Electronically Signed     OR/MEDQ  D:  03/21/2005  T:  03/21/2005  Job:  [id number]   cc:   Brooke [name]. Brooke [name], M.D.  Fax: [phone]

## 2010-11-04 NOTE — Discharge Summary (Signed)
[place]  Patient:    Brooke [name], Brooke [name] Visit Number: [id number] MRN: [id number]          Service Type: MED Location: [phone] Attending Physician:  Jetty Duhamel Driver Dictated by:   Rennis Chris. Maple Hudson, M.D. Admit Date:  07/24/2001 Discharge Date: 08/06/2001   CC:         Cala Bradford R. Renae Gloss, M.D.  Dewayne Shorter, M.D.   Discharge Summary  PROBLEMS: 1. Pneumonia with granulomas, tuberculosis versus bronchiolitis obliterans    versus vasculitis. 2. Remote history of positive PPD. 3. Thrombocytosis. 4. Hyperglycemia on steroids. 5. Pleural effusion. 6. Anemia, not otherwise specified.  BRIEF HISTORY OF PRESENT ILLNESS:  The patient is a 65 year old African-American woman followed by Dr. Renae Gloss for primary care.  Reportedly an outpatient diagnosis of Sjogrens syndrome was being considered because of dry mouth and dry eyes.  She had had increased shortness of breath, cough and malaise with decreased appetite, probably since around Thanksgiving of 2002 but especially in the first week of December.  She then had onset of cough, wheeze, nasal congestion, dyspnea, orthopnea, bilateral soreness in the ribs and further weight loss.  Chest x-ray in late December showed bibasilar infiltrates with small effusions. This was treated as pneumonia with levaquin for 7 days.  Follow up chest x-ray in early January showed little change but she seemed to be feeling better.  By the beginning of February she reported increased malaise and cough.  Laboratory showed a leukocytosis of 14K, hemoglobin 10.8 and sedimentation rate of 79.  CT scan showed a nodular bilateral infiltrate pattern, metastases versus granulomas, versus focal infection with bilateral lower lobe atelectasis, probable pneumonia, and small pleural effusions.  That scan was negative for pulmonary embolism.  She was admitted for further work-up.  PHYSICAL EXAMINATION:  On admission  significant for VITAL SIGNS: Temperature 100.9.  GENERAL:  She was weak, slender and dyspneic.  SKIN:  There were a few eczematoid spots on her skin, ? of lymph node enlargement in the left axilla.  LUNG:  Rhonchi, left greater than right in the bases with some dullness.  ABDOMEN: Liver was palpable one fingerbreadth below the right costal margin.  EXTREMITIES:  There was no obvious arthropathy.  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  HOSPITAL COURSE:  Repeat laboratories showed persistent leukocytosis and thrombocytosis with normal renal function.  Cultures were submitted and she was begun on empiric antibiotic coverage.  Outside films were reviewed with the Radiologist who felt her infiltrative pattern was most consistent with a pneumonia.  Attempts were made to induce sputum without much success.  After several days of empiric antibiotic coverage, it was not apparent that any progress had been made.  Infectious disease, Dr. Cliffton Asters, was consulted. He recommended that antibiotics be stopped.  Outpatient laboratory related to any work-up for Sjogrens was not obtained.  Tuberculosis was considered unlikely initially.  Bronchoscopy with transbronchial biopsy was performed demonstrating a granulomatous inflammatory necrotizing infiltrate with no organisms.  She then had an ultrasound guided thoracentesis by radiology which demonstrated exudative fluid, again nonspecific.  Because of her history of positive PPD a decision was made on consultation with infectious disease to cover her both for tuberculosis and for an inflammatory process such as bronchitis obliterans with organized pneumonia or vasculitis by combining antituberculous therapy and prednisone.  These medications were discussed carefully with  her.  Blood was also obtained for ANA and  antinuclear cytoplasmic antibody.  Her fever initially in the range of 101 came down as she was taken off antibiotics.  Near discharge her weight was recorded at 123 pounds.  She was discharged with condition improved, feeling better on prednisone but with stable chest x-ray picture.  LABORATORY DATA: BRONCHIAL BRUSHING:  Revealed rare granulomas with abundant reactive benign bronchial epithelial cells.  TRANSBRONCHIAL BIOPSIES showed granulomatous inflammation with focal necrosis.  Stains for AFB and FUNGI were negative.  Bronchial washings also revealed no malignant cells.  LEFT PLEURAL FLUID:  No malignant cells with occasional mesothelial cells on a mixed inflammatory background.  BRONCHOSCOPY:  Generalized grainy or granulomatous bronchitis of the mucosa but was otherwise nonspecific visually.  ELECTROCARDIOGRAM:  Sinus tachycardia otherwise normal.  CHEST X-RAY:  Bilateral accentuation of hilar and basilar markings, questionable inflammatory changes in both bases with some atelectasis and bilateral effusions.  This pattern remained stable.  Heart size was normal.  ARTERIAL BLOOD GASES:  On 07/24/01 on room air pH 7.51, pCO2 35, pO2 67, bicarbonate 28.  CBC:  Initial white blood cell count 17,500, hemoglobin 9.6, platelet count 819K, 79% neutrophils, 3 eosinophils.  Final determination on 08/05/01 white blood cell count 11,100, hemoglobin 10.3, platelet count 1,053,000.  SEDIMENTATION RATE:  On admission was 75.  PROTIME: 16.1 with INR 1.4, PTT 39.  ELECTROLYTES:  Showed mild hyponatremia.  Final determinations sodium 132, potassium 3.9, chloride 91, cO2 29, glucose on steroid therapy 170, before steroids 103.  BUN 11, creatinine 0.7, calcium 9.2, albumin 2.0, total protein 5.9, liver enzymes normal.  TSH LEVEL:  2.517.  PLEURAL FLUID:  1840 white blood cells, 76% neutrophils, 11% monocytes, protein 3.1, LDH 254 consistent with an exudate.   BLOOD CULTURES, PLEURAL FLUID  CULTURES: Negative.  SPUTUM:  Grew normal flora.  BRONCHOSCOPY CULTURES, PLEURAL FLUIDS:  No acid fast bacilli on smear, final culture is still pending.  No fungi.  NASAL SWAB FOR INFLUENZA: Negative.  ANTINUCLEAR CYTOPLASMIC ANTIBODY:  Less than 1:16, negative.  DISCHARGE PLANS: 1. Isoniazid 300 mg q.d. 2. Rifampin 300 mg times two q.d. 3. Pyrazinamide 500 mg times three q.d. 4. Ethambutol 400 mg times three q.d. 5. Pyridoxine / vitamin B6, 50 mg q.d. 6. Prednisone 20 mg t.i.d. times two days then 20 mg b.i.d. until office    follow up. 7. Premarin 1.25 mg q.d.  ACTIVITY:  Patient is to avoid close contact with sick, elderly and infants. DIET and activity otherwise unrestricted.  Patient will get her prescriptions for tuberculosis therapy at the [facility].  FOLLOW UP: Patient is to have office follow up with Dr. Maple Hudson in two weeks and with Dr. Renae Gloss as otherwise scheduled or needed.  DISCHARGE CONDITION:  Improved. Dictated by:   Rennis Chris. Maple Hudson, M.D. Attending Physician:  Jetty Duhamel Driver DD:  81/19/14 TD:  08/21/01 Job: [id number]

## 2010-11-04 NOTE — Progress Notes (Signed)
[facility]                        PERIPHERAL VASCULAR OFFICE NOTE   NAME:Brooke [name], Brooke [name]                      MRN:          [id number]  DATE:05/28/2006                            DOB:          [birth year]    REASON FOR CONSULTATION:  The patient referred by Dr. Maple Hudson for  consultation regarding possible peripheral arterial insufficiency.   HISTORY OF PRESENT ILLNESS:  Brooke [name] is a 65 year old woman with  hypertension and a right leg deep venous thrombosis in 2006.  She  presents with approximately 6 months of a pins-and-needles sensation in  her right leg.  She is also concerned that the skin around both ankles  is darker than the remainder of the skin.  The pins-and-needles  sensation occurs at rest and with exertion.  It is not worse with  exertion.  She has no cramping in her calves.  She has had no tissue  loss.   PAST MEDICAL HISTORY:  1. Atypical sarcoid diagnosed in 2003.  2. Status post cholecystectomy in 1974.  3. Hysterectomy in the 1980s.  4. Positive PPD.  5. Hypertension x1 year.  6. History of right leg DVT in 2006.   ALLERGIES:  CODEINE.   CURRENT MEDICATIONS:  1. Cardizem LA 180 mg per day.  2. Albuterol p.r.n.  3. Allegra p.r.n.   SOCIAL HISTORY:  The patient is retired and separated with one grown  child.  She quit smoking in 1998 after smoking a few cigarettes per day  for a decade.  Denies alcohol and illicit drug use.   FAMILY HISTORY:  Father died of myocardial infarction at 7.  Mother  died at 60 of stroke.  Five siblings and her sole child are all alive  and well.   REVIEW OF SYSTEMS:  Wears glasses.  Occasional exertional dyspnea.  She  does note chest tightness but on probing, this is the same as her  dyspnea.  Review of systems otherwise negative in detail except as  above.   PHYSICAL EXAMINATION:  VITAL SIGNS:  She is generally well-appearing in  no distress with heart rate 96, blood pressure 158/82 on  the right and  160/88 on the left.  She is 5 feet 3 inches tall and weighs 152 pounds.  HEENT:  Normal.  SKIN:  Exam was normal except for some mild hyperpigmentation over both  ankles.  No petechia and no xanthelasma.  MUSCULOSKELETAL:  Exam is normal.  NECK:  She has no jugular venous distention, thyromegaly, or  lymphadenopathy.  RESPIRATORY:  Effort is normal.  LUNGS:  Clear to auscultation.  HEART:  She has a nondisplaced point of maximal cardiac impulse.  There  is a regular rate and rhythm without murmur, rub, or gallop.  ABDOMEN:  Soft, nondistended, nontender.  There is no  hepatosplenomegaly.  Bowel sounds are normal.  EXTREMITIES:  Warm with 1-2+ edema right greater than left leg.  Mild  hyperpigmentation but no other changes of chronic venous stasis.  Carotid pulses 2+ bilaterally without bruit.  Femoral pulses 2+  bilaterally without bruit.  Popliteal pulses 1+ bilaterally.  DP and PT  pulses are easily dopplerable bilaterally.  They are difficult, however,  to feel through the edema.  NEUROLOGIC:  She is alert and oriented x3 with normal affect and normal  neurologic exam including normal sensation on her feet.   IMPRESSION/RECOMMENDATION:  1. Pins-and-needles sensation in her feet, in the setting of venous      insufficiency related to her right leg deep venous thrombosis.      Venous duplex performed at [facility] on      March 19, 2006, showed no DVT on the left.  This would thus not      explain the mild edema on the left.  I do not think her symptoms      represent arterial insufficiency.  However, assessment of her      distal vasculature is difficult due to the edema.  We will      therefore obtain ABIs.  I will have her follow up with me      thereafter.  2. Hypertension:  Blood pressure elevated today.  She tells me she is      nervous.  We will therefore make no changes in her medications.     Salvadore Farber, MD  Electronically  Signed    WED/MedQ  DD: 06/07/2006  DT: 06/08/2006  Job #: [id number]

## 2010-11-04 NOTE — Op Note (Signed)
[place]. [facility]  Patient:    Brooke [name], Brooke [name] Visit Number: [id number] MRN: [id number]          Service Type: MED Location: [id number] Attending Physician:  Jetty Duhamel Driver Dictated by:   Rennis Chris. Maple Hudson, M.D. Proc. Date: 07/30/01 Admit Date:  07/24/2001   CC:         Cala Bradford R. Renae Gloss, M.D.   Operative Report  INDICATIONS:  A 65 year old woman with leukocytosis, fever, bilateral basilar pulmonary infiltrates, and small pleural effusions.  Remote history of PPD. Bronchoscopy is performed for diagnostic evaluation.  DESCRIPTION OF PROCEDURE:  After fully informed consent, bronchoscopy was performed on an inpatient basis in a negative pressure room.  Premedication was with Demerol and Atropine.  The upper airway was anesthetized topically with cetacaine spray and with Xylocaine.  Oxygen was provided at 8 to 10 liters per minute by nasal prongs holding saturations over 92%.  Cardiac monitor showed normal sinus rhythm.  A cumulative dose of 5 mg of intravenous Versed was given for additional sedation and cough control.  An Olympus fiberoptic bronchoscope was advanced via the right nostril to the level of the vocal cords without difficulty.  Cough was moderate and the cords moved normally.  Trachea and main carina were normal.  Sequential examination of each lobar and segmental airway bilaterally to the fourth division level revealed no endobronchial obstruction or lesions.  Secretions were clear and mucoid.  The bronchial mucosa was generally rather granulomatous-looking. Under fluoroscopic guidance, the bronchoscope was directed to the right lower lobe and biopsies were obtained by standard transbronchial lung biopsy technique.  Brushing and saline washing were performed.  There was trivial bleeding and she tolerated the procedure very well with some cough postoperatively.  Chest x-ray is pending.  FINAL IMPRESSION:  Bibasilar infiltrates  with atelectasis and effusions, etiology unclear. Dictated by:   Rennis Chris. Maple Hudson, M.D. Attending Physician:  Jetty Duhamel Driver DD:  08/65/78 TD:  07/30/01 Job: [id number]

## 2010-11-04 NOTE — Assessment & Plan Note (Signed)
[facility]                               PULMONARY OFFICE NOTE   NAME:Brooke [name], Brooke [name]                      MRN:          [id number]  DATE:05/07/2006                            DOB:          10-25-1945    PROBLEMS:  1. 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  2. History of pleural effusion.  3. Peripheral neuropathy.  4. Allergic rhinitis.  5. History of deep vein thrombosis.   HISTORY:  This former smoker has been followed since the late 1990s for lung  disease that began originally with hilar adenopathy and prominent vascular  markings.  She developed loss of lung volume with bibasilar scarring and  biopsies showed granulomatous inflammation with focal necrosis.  ACE levels  were always normal.  There was no sign of infection.  She had had pleural  effusion, at one point which would have been uncommon, but we were left with  atypical sarcoid as the best available diagnosis.  She was seen by neurology  in 2003 for peripheral neuropathy with the differential including sarcoid.  She was treated for a while with antituberculous therapy in 2003, noting  that she had had a positive tuberculosis skin test in 1971, but repeated  cultures never showed tuberculosis and she was seen in consultation on the  issue with Dr. Cliffton Asters.  Pulmonary function tests have shown severe  restriction.  She had seen Dr. Jimmy Footman to rule out Sjogren's and had not  been identified with an autoimmune disorder.  For at least three years, she  has had some discomfort with her legs, left more than right.  She returns  now for six-month followup, saying she is better than last visit, meaning  less need for metered inhaler.  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  Dr. Renae Gloss did  another venous Doppler, which was negative.   Pins and needles paresthesias  in the left calf wake her at night.  She has had a little stable numbness in  her toes without change.  Chest x-ray in May had shown slight improvement in  basilar aeration with significant scarring in both bases.  Heart was not  enlarged.   MEDICATION:  1. Cemela.  2. Albuterol inhaler.  3. Allegra 60 mg b.i.d.   ALLERGIES:  Drug intolerant of codeine.   She has been off Coumadin and prednisone.   She had had her first pneumococcal vaccine in 2003.   OBJECTIVE:  Weight 155 pounds, which if correct, is up 8 pounds since May.  Blood pressure 132/80, pulse regular, 77.  Room air saturation 100%.  Her  left lower leg to mid-calf is warm, but distinctly hyperpigmented.  She was  wearing an elastic stocking and does not seem to have edema.  Pulses may be  a bit diminished.  Heart sounds are regular, without murmur or gallop.  Lung  fields are clear, except diminished in the bases.  I do not feel enlargement  of liver or spleen.  She does not have neck vein distention.   IMPRESSION:  1. Abnormal chest x-ray with stable bibasilar scarring, which had been      attributed to seronegative sarcoid for lack of a better explanation.  2. I question whether she may have peripheral arterial disease, probably      small vessel.  She may end up needing a biopsy or further rheumatology      and neurology evaluation, but we are going to rule out the possibility      of large-vessel arterial deficiency and I have asked consultation of      Dr. Randa Evens for this purpose.  3. We will schedule pulmonary function tests.  4. She was given flu vaccine.  5. Schedule return with me in six months, as long as her pulmonary status      is stable, earlier p.r.n.     Clinton D. Maple Hudson, MD, Tonny Bollman, FACP  Electronically Signed    CDY/MedQ  DD: 05/08/2006  DT: 05/09/2006  Job #: [id number]   cc:   Merlene Laughter. Renae Gloss, M.D.  Salvadore Farber, MD

## 2010-11-24 ENCOUNTER — Other Ambulatory Visit: Payer: Self-pay | Admitting: Internal Medicine

## 2010-11-28 ENCOUNTER — Other Ambulatory Visit: Payer: Self-pay | Admitting: Internal Medicine

## 2011-06-20 HISTORY — PX: FOOT SURGERY: SHX648

## 2011-10-09 ENCOUNTER — Other Ambulatory Visit: Payer: Self-pay | Admitting: Internal Medicine

## 2011-10-30 ENCOUNTER — Other Ambulatory Visit: Payer: Self-pay

## 2012-08-26 ENCOUNTER — Ambulatory Visit (INDEPENDENT_AMBULATORY_CARE_PROVIDER_SITE_OTHER)
Admission: RE | Admit: 2012-08-26 | Discharge: 2012-08-26 | Disposition: A | Discharge status: Home / Self Care | Payer: Federal, State, Local not specified - PPO | Source: Ambulatory Visit | Attending: Internal Medicine | Admitting: Internal Medicine

## 2012-08-26 ENCOUNTER — Ambulatory Visit (INDEPENDENT_AMBULATORY_CARE_PROVIDER_SITE_OTHER): Payer: Federal, State, Local not specified - PPO | Admitting: Internal Medicine

## 2012-08-26 ENCOUNTER — Encounter: Payer: Self-pay | Admitting: Internal Medicine

## 2012-08-26 VITALS — BP 122/72 | HR 84 | Ht 63.0 in | Wt 149.6 lb

## 2012-08-26 DIAGNOSIS — G629 Polyneuropathy, unspecified: Secondary | ICD-10-CM | POA: Insufficient documentation

## 2012-08-26 DIAGNOSIS — D869 Sarcoidosis, unspecified: Secondary | ICD-10-CM

## 2012-08-26 NOTE — Progress Notes (Signed)
67 yo F previously followed for atypical/ sero-negative sarcoid with bilateral lower zone lung volume loss, peripheral neuropathy. Hx pleural effusion, Hx DVT, Hx allergic rhinitis FOLLOWS FOR: pt reports having SOB from time to time-- denies any other concerns She thought it was time to come back for reassessment. Stable dyspnea on exertion carrying, pushing, climbing stairs. Denies night sweats, adenopathy, fever, chest pain or palpitation, calf pain or swelling. No cough. Was treated for chronic urticaria with prednisone and then maintenance therapy with Zyrtec during the day and Vistaril at night. She is now just using Zyrtec with no hives in months. She had not been asked to stop lisinopril which we discussed. CXR 11/12/07 IMPRESSION:  1. Mild increase in pleural effusions and associated bibasilar air  space opacities.  2. Stable biapical subpleural scarring.  Eliot Bencivenga: Darrelyn Hillock, Bud Face  ROS-see HPI Constitutional:   No-   weight loss, night sweats, fevers, chills, fatigue, lassitude. HEENT:   No-  headaches, difficulty swallowing, tooth/dental problems, sore throat,       No-  sneezing, itching, ear ache, nasal congestion, post nasal drip,  CV:  No-   chest pain, orthopnea, PND, swelling in lower extremities, anasarca,                                  dizziness, palpitations Resp: No-   shortness of breath with exertion or at rest.              No-   productive cough,  No non-productive cough,  No- coughing up of blood.              No-   change in color of mucus.  No- wheezing.   Skin: No-   rash or lesions. GI:  No-   heartburn, indigestion, abdominal pain, nausea, vomiting,  GU: . MS:  No-   joint pain or swelling.  . Neuro-     nothing unusual Psych:  No- change in mood or affect. No depression or anxiety.  No memory loss.  OBJ- Physical Exam General- Alert, Oriented, Affect-appropriate, Distress- none acute. Looks well. Skin- rash-none, lesions- none,  excoriation- none Lymphadenopathy- none Head- atraumatic            Eyes- Gross vision intact, PERRLA, conjunctivae and secretions clear            Ears- Hearing, canals-normal            Nose- Clear, no-Septal dev, mucus, polyps, erosion, perforation             Throat- Mallampati II , mucosa clear , drainage- none, tonsils- atrophic Neck- flexible , trachea midline, no stridor , thyroid nl, carotid no bruit Chest - symmetrical excursion , unlabored           Heart/CV- RRR , no murmur , no gallop  , no rub, nl s1 s2                           - JVD- none , edema- none, stasis changes- none, varices- none           Lung- clear to P&A, + decreased in bases, wheeze- none, cough- none , dullness-none, rub- none           Chest wall-  Abd-  Br/ Gen/ Rectal- Not done, not indicated Extrem- cyanosis- none, clubbing, none, atrophy- none, strength- nl Neuro-  grossly intact to observation

## 2012-08-26 NOTE — Patient Instructions (Addendum)
Long-term Zyrtec seems to be safe, but try tapering off gradually to see if the hives come back. Hives are a common side-effect of lisinopril and other "ACE inhibitors", so that would be an issue to discus with your doctor if hives do come back.  Order- CXR    Dx history of Sarcoid

## 2012-08-26 NOTE — Assessment & Plan Note (Signed)
This condition may be burned out now. Plan-obtain chest x-ray. Continue walking to maintain endurance.

## 2012-08-29 ENCOUNTER — Telehealth: Payer: Self-pay | Admitting: Internal Medicine

## 2012-08-29 NOTE — Progress Notes (Signed)
Quick Note:  Pt aware of results. ______ 

## 2012-08-29 NOTE — Progress Notes (Signed)
Quick Note:  LMTCB ______ 

## 2012-08-29 NOTE — Telephone Encounter (Signed)
Result Note    CXR - dense markings in the bases have improved since last xray and probably represent scarring.      Pt aware of results.

## 2012-12-03 ENCOUNTER — Other Ambulatory Visit: Payer: Self-pay | Admitting: Internal Medicine

## 2012-12-03 MED ORDER — ALBUTEROL SULFATE HFA 108 (90 BASE) MCG/ACT IN AERS: INHALATION_SPRAY | RESPIRATORY_TRACT | Status: DC

## 2012-12-03 NOTE — Telephone Encounter (Signed)
Received faxed refill request from [facility] [address] for proair hfa Last ov w/ CY 3.10.14, follow up in 1 year Refills sent Will sign off

## 2013-08-26 ENCOUNTER — Ambulatory Visit (INDEPENDENT_AMBULATORY_CARE_PROVIDER_SITE_OTHER)
Admission: RE | Admit: 2013-08-26 | Discharge: 2013-08-26 | Disposition: A | Discharge status: Home / Self Care | Payer: Federal, State, Local not specified - PPO | Source: Ambulatory Visit | Attending: Internal Medicine | Admitting: Internal Medicine

## 2013-08-26 ENCOUNTER — Encounter: Payer: Self-pay | Admitting: Internal Medicine

## 2013-08-26 ENCOUNTER — Ambulatory Visit (INDEPENDENT_AMBULATORY_CARE_PROVIDER_SITE_OTHER): Payer: Federal, State, Local not specified - PPO | Admitting: Internal Medicine

## 2013-08-26 ENCOUNTER — Other Ambulatory Visit (INDEPENDENT_AMBULATORY_CARE_PROVIDER_SITE_OTHER): Payer: Federal, State, Local not specified - PPO

## 2013-08-26 VITALS — BP 122/68 | HR 93 | Ht 63.0 in | Wt 145.5 lb

## 2013-08-26 DIAGNOSIS — D869 Sarcoidosis, unspecified: Secondary | ICD-10-CM

## 2013-08-26 DIAGNOSIS — E119 Type 2 diabetes mellitus without complications: Secondary | ICD-10-CM

## 2013-08-26 LAB — HEPATIC FUNCTION PANEL
ALT: 16 U/L (ref 0–35)
AST: 18 U/L (ref 0–37)
Albumin: 4.4 g/dL (ref 3.5–5.2)
Alkaline Phosphatase: 89 U/L (ref 39–117)
Bilirubin, Direct: 0.1 mg/dL (ref 0.0–0.3)
Total Bilirubin: 0.5 mg/dL (ref 0.3–1.2)
Total Protein: 7.2 g/dL (ref 6.0–8.3)

## 2013-08-26 LAB — CBC WITH DIFFERENTIAL/PLATELET
BASOS PCT: 1.3 % (ref 0.0–3.0)
Basophils Absolute: 0.1 10*3/uL (ref 0.0–0.1)
EOS PCT: 3 % (ref 0.0–5.0)
Eosinophils Absolute: 0.2 10*3/uL (ref 0.0–0.7)
HEMATOCRIT: 35.2 % — AB (ref 36.0–46.0)
HEMOGLOBIN: 11.9 g/dL — AB (ref 12.0–15.0)
LYMPHS ABS: 2.3 10*3/uL (ref 0.7–4.0)
Lymphocytes Relative: 40.2 % (ref 12.0–46.0)
MCHC: 33.8 g/dL (ref 30.0–36.0)
MCV: 89.9 fl (ref 78.0–100.0)
MONOS PCT: 7.6 % (ref 3.0–12.0)
Monocytes Absolute: 0.4 10*3/uL (ref 0.1–1.0)
NEUTROS ABS: 2.8 10*3/uL (ref 1.4–7.7)
Neutrophils Relative %: 47.9 % (ref 43.0–77.0)
Platelets: 339 10*3/uL (ref 150.0–400.0)
RBC: 3.91 Mil/uL (ref 3.87–5.11)
RDW: 13.3 % (ref 11.5–14.6)
WBC: 5.8 10*3/uL (ref 4.5–10.5)

## 2013-08-26 LAB — BASIC METABOLIC PANEL
BUN: 20 mg/dL (ref 6–23)
CO2: 27 meq/L (ref 19–32)
CREATININE: 1.6 mg/dL — AB (ref 0.4–1.2)
Calcium: 9.5 mg/dL (ref 8.4–10.5)
Chloride: 98 mEq/L (ref 96–112)
GFR: 42.51 mL/min — ABNORMAL LOW (ref >60.00)
Glucose, Bld: 181 mg/dL — ABNORMAL HIGH (ref 70–99)
Potassium: 4 mEq/L (ref 3.5–5.1)
Sodium: 134 mEq/L — ABNORMAL LOW (ref 135–145)

## 2013-08-26 LAB — ANGIOTENSIN CONVERTING ENZYME: ANGIOTENSIN-CONVERTING ENZYME: 31 U/L (ref 8–52)

## 2013-08-26 NOTE — Progress Notes (Signed)
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 Hx pleural effusion, Hx DVT, Hx allergic rhinitis FOLLOWS FOR: pt reports having SOB from time to time-- denies any other concerns She thought it was time to come back for reassessment. Stable dyspnea on exertion carrying, pushing, climbing stairs. Denies night sweats, adenopathy, fever, chest pain or palpitation, calf pain or swelling. No cough. Was treated for chronic urticaria with prednisone and then maintenance therapy with Zyrtec during the day and Vistaril at night. She is now just using Zyrtec with no hives in months. She had not been asked to stop lisinopril which we discussed. CXR 11/12/07 IMPRESSION:  1. Mild increase in pleural effusions and associated bibasilar air  space opacities.  2. Stable biapical subpleural scarring.  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 Hx pleural effusion, Hx DVT, Hx allergic rhinitis FOLLOWS FOR: States she has good days and bad days. C/o SOB with mild-moderate activity and chest tightness with weather change  and when SOB. When pt gets hot flashes it "cuts off the oxygen" and is unable to breathe smoothly. Denies CP.  CXR 08/26/12 IMPRESSION:  Bibasilar densities, improved since prior study. Residual  opacities likely reflect scarring.  Decreasing small bilateral effusions. Residual small effusions  versus pleural thickening.  Original Report Authenticated By: Rolm Baptise, M.D.  ROS-see HPI Constitutional:   No-   weight loss, +night sweats, fevers, chills, fatigue, lassitude. HEENT:   No-  headaches, difficulty swallowing, tooth/dental problems, sore throat,       No-  sneezing, itching, ear ache, nasal congestion, post nasal drip,  CV:  No-   chest pain, orthopnea, PND, swelling  in lower extremities, anasarca,                                                           dizziness, palpitations Resp: +shortness of breath with exertion or at rest.              No-   productive cough,  No non-productive cough,  No- coughing up of blood.              No-   change in color of mucus.  No- wheezing.   Skin: No-   rash or lesions. GI:  No-   heartburn, indigestion, abdominal pain, nausea, vomiting,  GU: . MS:  No-   joint pain or swelling.  . Neuro-     nothing unusual Psych:  No- change in mood or affect. No depression or anxiety.  No memory loss.  OBJ- Physical Exam General- Alert, Oriented, Affect-appropriate, Distress- none acute. +Looks well. Skin- rash-none, lesions- none, excoriation- none Lymphadenopathy- none Head- atraumatic            Eyes- Gross vision intact, PERRLA, conjunctivae and secretions clear            Ears- Hearing, canals-normal            Nose- Clear, no-Septal dev, mucus, polyps, erosion, perforation             Throat- Mallampati II , mucosa clear , drainage- none, tonsils- atrophic Neck- flexible , trachea midline, no stridor , thyroid  nl, carotid no bruit Chest - symmetrical excursion , unlabored           Heart/CV- RRR , no murmur , no gallop  , no rub, nl s1 s2                           - JVD- none , edema- none, stasis changes- none, varices- none           Lung- +trace crackle in bases, + decreased in bases, wheeze- none, cough- none ,                      dullness-none, rub- none           Chest wall-  Abd-  Br/ Gen/ Rectal- Not done, not indicated Extrem- cyanosis- none, clubbing, none, atrophy- none, strength- nl Neuro- grossly intact to observation

## 2013-08-26 NOTE — Patient Instructions (Addendum)
Order - CXR hx sarcoid  Order- lab- CBC w diff, BMET, Liver panel, ACE    [facility]  [facility] to a new primary care physician, Dx diabetes

## 2013-09-01 ENCOUNTER — Telehealth: Payer: Self-pay | Admitting: Internal Medicine

## 2013-09-01 NOTE — Telephone Encounter (Signed)
Spoke with pt and advised of lab and cxr results per Dr Annamaria Boots.  Lab results faxed to DR Heath Gold per pt request.

## 2013-09-13 NOTE — Assessment & Plan Note (Signed)
She describes hot flashes but is postmenopausal. Not sure if this could be related to sarcoid Plan-chest x-ray, CBC, chemistry, LFT

## 2014-01-14 ENCOUNTER — Other Ambulatory Visit: Payer: Self-pay | Admitting: Internal Medicine

## 2014-01-14 MED ORDER — ALBUTEROL SULFATE HFA 108 (90 BASE) MCG/ACT IN AERS: INHALATION_SPRAY | RESPIRATORY_TRACT | Status: DC

## 2014-04-03 ENCOUNTER — Ambulatory Visit: Payer: Federal, State, Local not specified - PPO | Admitting: Internal Medicine

## 2014-04-26 ENCOUNTER — Encounter: Payer: Self-pay | Admitting: *Deleted

## 2014-07-13 ENCOUNTER — Ambulatory Visit (INDEPENDENT_AMBULATORY_CARE_PROVIDER_SITE_OTHER): Payer: Federal, State, Local not specified - PPO | Admitting: Internal Medicine

## 2014-07-13 ENCOUNTER — Other Ambulatory Visit (INDEPENDENT_AMBULATORY_CARE_PROVIDER_SITE_OTHER): Payer: Federal, State, Local not specified - PPO

## 2014-07-13 ENCOUNTER — Encounter: Payer: Self-pay | Admitting: Internal Medicine

## 2014-07-13 VITALS — BP 202/110 | HR 76 | Temp 98.3°F | Resp 14 | Ht 63.0 in | Wt 143.1 lb

## 2014-07-13 DIAGNOSIS — H42 Glaucoma in diseases classified elsewhere: Secondary | ICD-10-CM

## 2014-07-13 DIAGNOSIS — E785 Hyperlipidemia, unspecified: Secondary | ICD-10-CM

## 2014-07-13 DIAGNOSIS — D869 Sarcoidosis, unspecified: Secondary | ICD-10-CM

## 2014-07-13 DIAGNOSIS — E1139 Type 2 diabetes mellitus with other diabetic ophthalmic complication: Secondary | ICD-10-CM | POA: Insufficient documentation

## 2014-07-13 DIAGNOSIS — E119 Type 2 diabetes mellitus without complications: Secondary | ICD-10-CM

## 2014-07-13 DIAGNOSIS — E1169 Type 2 diabetes mellitus with other specified complication: Secondary | ICD-10-CM | POA: Insufficient documentation

## 2014-07-13 DIAGNOSIS — I10 Essential (primary) hypertension: Secondary | ICD-10-CM

## 2014-07-13 LAB — LIPID PANEL
CHOLESTEROL: 162 mg/dL (ref 0–200)
HDL: 56.7 mg/dL (ref >39.00)
NonHDL: 105.3
Total CHOL/HDL Ratio: 3
Triglycerides: 209 mg/dL — ABNORMAL HIGH (ref 0.0–149.0)
VLDL: 41.8 mg/dL — ABNORMAL HIGH (ref 0.0–40.0)

## 2014-07-13 LAB — BASIC METABOLIC PANEL
BUN: 11 mg/dL (ref 6–23)
CHLORIDE: 103 meq/L (ref 96–112)
CO2: 29 mEq/L (ref 19–32)
Calcium: 9.5 mg/dL (ref 8.4–10.5)
Creatinine, Ser: 0.77 mg/dL (ref 0.40–1.20)
GFR: 95.06 mL/min (ref >60.00)
GLUCOSE: 116 mg/dL — AB (ref 70–99)
Potassium: 4.3 mEq/L (ref 3.5–5.1)
Sodium: 138 mEq/L (ref 135–145)

## 2014-07-13 LAB — HEMOGLOBIN A1C: Hgb A1c MFr Bld: 7.9 % — ABNORMAL HIGH (ref 4.6–6.5)

## 2014-07-13 LAB — LDL CHOLESTEROL, DIRECT: LDL DIRECT: 81 mg/dL

## 2014-07-13 MED ORDER — CLONIDINE HCL 0.1 MG PO TABS: 0.1000 mg | ORAL_TABLET | Freq: Two times a day (BID) | ORAL | Status: DC

## 2014-07-13 MED ORDER — SAXAGLIPTIN-METFORMIN ER 5-1000 MG PO TB24: 1.0000 | ORAL_TABLET | Freq: Every day | ORAL | Status: DC

## 2014-07-13 MED ORDER — GLIMEPIRIDE 2 MG PO TABS: 2.0000 mg | ORAL_TABLET | Freq: Every day | ORAL | Status: DC

## 2014-07-13 MED ORDER — OLMESARTAN MEDOXOMIL-HCTZ 40-25 MG PO TABS: 1.0000 | ORAL_TABLET | Freq: Every day | ORAL | Status: DC

## 2014-07-13 MED ORDER — FLUOXETINE HCL 10 MG PO TABS: 10.0000 mg | ORAL_TABLET | Freq: Every day | ORAL | Status: DC

## 2014-07-13 NOTE — Assessment & Plan Note (Addendum)
Will refill clonidine, replace her BP medication with olmesartan/hctz. Bring her back in 1-2 weeks for BP check. Check BMP today. No signs of damage from BP. She is out of medication at today's visit.

## 2014-07-13 NOTE — Assessment & Plan Note (Signed)
Patient taking crestor 20 mg daily and will continue for now. No signs of side effects, check lipid panel today.

## 2014-07-13 NOTE — Progress Notes (Signed)
Pre visit review using our clinic review tool, if applicable. No additional management support is needed unless otherwise documented below in the visit note. 

## 2014-07-13 NOTE — Assessment & Plan Note (Signed)
She will continue to follow with pulmonology yearly. Has not needed any inhalers recently and denies SOB.

## 2014-07-13 NOTE — Progress Notes (Signed)
   Subjective:    Patient ID: Brooke [name], female    DOB: July 22, 1945, 69 y.o.   MRN: [id number]  HPI The patient is a 69 YO female who is coming in to establish care. She has PMH of diabetes, HTN, hot flashes, sarcoidosis. She does follow with pulmonology once a year to follow her sarcoid. She has been out of all her medications for about 1 month and is not taking anything right now. She denies any low sugars, chest pains, headache, nausea, confusion. She has been trying to exercise and has lost about 10 pounds in the last 6 months to help with her sugars and blood pressure.   Review of Systems  Constitutional: Negative for fever, activity change, appetite change, fatigue and unexpected weight change.  HENT: Negative.   Respiratory: Negative for cough, chest tightness, shortness of breath and wheezing.   Cardiovascular: Negative for chest pain, palpitations and leg swelling.  Gastrointestinal: Negative for nausea, abdominal pain, diarrhea, constipation and abdominal distention.  Musculoskeletal: Negative for myalgias and arthralgias.  Skin: Negative.   Neurological: Negative for dizziness, weakness, light-headedness and headaches.  Psychiatric/Behavioral: Negative for confusion.      Objective:   Physical Exam  Constitutional: She is oriented to person, place, and time. She appears well-developed and well-nourished.  HENT:  Head: Normocephalic and atraumatic.  Eyes: EOM are normal.  Neck: Normal range of motion.  Cardiovascular: Normal rate and regular rhythm.   Pulmonary/Chest: Effort normal and breath sounds normal. No respiratory distress. She has no wheezes. She has no rales.  Abdominal: Soft. Bowel sounds are normal. She exhibits no distension. There is no tenderness. There is no rebound.  Musculoskeletal: She exhibits no edema.  Neurological: She is alert and oriented to person, place, and time. Coordination normal.  Skin: Skin is warm and dry.   Filed Vitals:   07/13/14  1355  BP: 202/110  Pulse: 76  Temp: 98.3 F (36.8 C)  TempSrc: Oral  Resp: 14  Height: 5\' 3"  (1.6 m)  Weight: 143 lb 1.9 oz (64.919 kg)  SpO2: 93%      Assessment & Plan:

## 2014-07-13 NOTE — Patient Instructions (Signed)
We have sent in your medications for blood pressure and diabetes to your pharmacy. We will have you go down to the lab to get your blood checked for diabetes and for your kidneys.   We will have you come back in 1-2 weeks for a nursing visit (no copay) to check on your blood pressure. If it comes down with the medicine we will see you back in about 3-6 months. If the blood pressure is still high we may need to adjust medications for blood pressure.   You are doing great with the weight loss and exercise helps to keep your body healthy.  Diabetes and Exercise Exercising regularly is important. It is not just about losing weight. It has many health benefits, such as:  Improving your overall fitness, flexibility, and endurance.  Increasing your bone density.  Helping with weight control.  Decreasing your body fat.  Increasing your muscle strength.  Reducing stress and tension.  Improving your overall health. People with diabetes who exercise gain additional benefits because exercise:  Reduces appetite.  Improves the body's use of blood sugar (glucose).  Helps lower or control blood glucose.  Decreases blood pressure.  Helps control blood lipids (such as cholesterol and triglycerides).  Improves the body's use of the hormone insulin by:  Increasing the body's insulin sensitivity.  Reducing the body's insulin needs.  Decreases the risk for heart disease because exercising:  Lowers cholesterol and triglycerides levels.  Increases the levels of good cholesterol (such as high-density lipoproteins [HDL]) in the body.  Lowers blood glucose levels. YOUR ACTIVITY PLAN  Choose an activity that you enjoy and set realistic goals. Your health care provider or diabetes educator can help you make an activity plan that works for you. Exercise regularly as directed by your health care provider. This includes:  Performing resistance training twice a week such as push-ups, sit-ups,  lifting weights, or using resistance bands.  Performing 150 minutes of cardio exercises each week such as walking, running, or playing sports.  Staying active and spending no more than 90 minutes at one time being inactive. Even short bursts of exercise are good for you. Three 10-minute sessions spread throughout the day are just as beneficial as a single 30-minute session. Some exercise ideas include:  Taking the dog for a walk.  Taking the stairs instead of the elevator.  Dancing to your favorite song.  Doing an exercise video.  Doing your favorite exercise with a friend. RECOMMENDATIONS FOR EXERCISING WITH TYPE 1 OR TYPE 2 DIABETES   Check your blood glucose before exercising. If blood glucose levels are greater than 240 mg/dL, check for urine ketones. Do not exercise if ketones are present.  Avoid injecting insulin into areas of the body that are going to be exercised. For example, avoid injecting insulin into:  The arms when playing tennis.  The legs when jogging.  Keep a record of:  Food intake before and after you exercise.  Expected peak times of insulin action.  Blood glucose levels before and after you exercise.  The type and amount of exercise you have done.  Review your records with your health care provider. Your health care provider will help you to develop guidelines for adjusting food intake and insulin amounts before and after exercising.  If you take insulin or oral hypoglycemic agents, watch for signs and symptoms of hypoglycemia. They include:  Dizziness.  Shaking.  Sweating.  Chills.  Confusion.  Drink plenty of water while you exercise to prevent dehydration  or heat stroke. Body water is lost during exercise and must be replaced.  Talk to your health care provider before starting an exercise program to make sure it is safe for you. Remember, almost any type of activity is better than none. Document Released: 08/26/2003 Document Revised:  10/20/2013 Document Reviewed: 11/12/2012 [facility] Patient Information 2015 [place], [place]. This information is not intended to replace advice given to you by your health care provider. Make sure you discuss any questions you have with your health care provider.

## 2014-07-27 ENCOUNTER — Ambulatory Visit: Payer: Federal, State, Local not specified - PPO | Admitting: Geriatric Medicine

## 2014-07-27 VITALS — BP 138/76

## 2014-07-27 DIAGNOSIS — Z0130 Encounter for examination of blood pressure without abnormal findings: Secondary | ICD-10-CM

## 2014-08-09 ENCOUNTER — Encounter (HOSPITAL_COMMUNITY): Payer: Self-pay | Admitting: Emergency Medicine

## 2014-08-09 ENCOUNTER — Inpatient Hospital Stay (HOSPITAL_COMMUNITY)
Admission: EM | Admit: 2014-08-09 | Discharge: 2014-08-10 | DRG: 378 | Disposition: A | Discharge status: Home / Self Care | Payer: Medicare Other | Attending: Internal Medicine | Admitting: Internal Medicine

## 2014-08-09 ENCOUNTER — Encounter (HOSPITAL_COMMUNITY)
Admission: EM | Disposition: A | Discharge status: Home / Self Care | Payer: Self-pay | Source: Home / Self Care | Attending: Internal Medicine

## 2014-08-09 DIAGNOSIS — Z86711 Personal history of pulmonary embolism: Secondary | ICD-10-CM

## 2014-08-09 DIAGNOSIS — K921 Melena: Secondary | ICD-10-CM | POA: Diagnosis present

## 2014-08-09 DIAGNOSIS — D649 Anemia, unspecified: Secondary | ICD-10-CM | POA: Insufficient documentation

## 2014-08-09 DIAGNOSIS — J309 Allergic rhinitis, unspecified: Secondary | ICD-10-CM | POA: Diagnosis present

## 2014-08-09 DIAGNOSIS — K209 Esophagitis, unspecified: Secondary | ICD-10-CM | POA: Diagnosis present

## 2014-08-09 DIAGNOSIS — Z8711 Personal history of peptic ulcer disease: Secondary | ICD-10-CM

## 2014-08-09 DIAGNOSIS — D62 Acute posthemorrhagic anemia: Secondary | ICD-10-CM | POA: Diagnosis present

## 2014-08-09 DIAGNOSIS — Z87891 Personal history of nicotine dependence: Secondary | ICD-10-CM

## 2014-08-09 DIAGNOSIS — K2901 Acute gastritis with bleeding: Principal | ICD-10-CM

## 2014-08-09 DIAGNOSIS — R55 Syncope and collapse: Secondary | ICD-10-CM | POA: Diagnosis not present

## 2014-08-09 DIAGNOSIS — D869 Sarcoidosis, unspecified: Secondary | ICD-10-CM | POA: Diagnosis present

## 2014-08-09 DIAGNOSIS — Z8719 Personal history of other diseases of the digestive system: Secondary | ICD-10-CM

## 2014-08-09 DIAGNOSIS — K2961 Other gastritis with bleeding: Secondary | ICD-10-CM | POA: Diagnosis present

## 2014-08-09 DIAGNOSIS — Z9111 Patient's noncompliance with dietary regimen: Secondary | ICD-10-CM | POA: Diagnosis present

## 2014-08-09 DIAGNOSIS — I10 Essential (primary) hypertension: Secondary | ICD-10-CM | POA: Diagnosis present

## 2014-08-09 DIAGNOSIS — Z7982 Long term (current) use of aspirin: Secondary | ICD-10-CM

## 2014-08-09 DIAGNOSIS — E1169 Type 2 diabetes mellitus with other specified complication: Secondary | ICD-10-CM | POA: Diagnosis present

## 2014-08-09 DIAGNOSIS — Z8507 Personal history of malignant neoplasm of pancreas: Secondary | ICD-10-CM | POA: Diagnosis not present

## 2014-08-09 DIAGNOSIS — Z885 Allergy status to narcotic agent status: Secondary | ICD-10-CM | POA: Diagnosis not present

## 2014-08-09 DIAGNOSIS — K922 Gastrointestinal hemorrhage, unspecified: Secondary | ICD-10-CM | POA: Diagnosis present

## 2014-08-09 DIAGNOSIS — E785 Hyperlipidemia, unspecified: Secondary | ICD-10-CM | POA: Diagnosis present

## 2014-08-09 DIAGNOSIS — E119 Type 2 diabetes mellitus without complications: Secondary | ICD-10-CM | POA: Diagnosis present

## 2014-08-09 HISTORY — DX: Other gastritis with bleeding: K29.61

## 2014-08-09 HISTORY — DX: Essential (primary) hypertension: I10

## 2014-08-09 HISTORY — PX: ESOPHAGOGASTRODUODENOSCOPY: SHX5428

## 2014-08-09 HISTORY — DX: Type 2 diabetes mellitus without complications: E11.9

## 2014-08-09 LAB — POC OCCULT BLOOD, ED: Fecal Occult Bld: POSITIVE — AB

## 2014-08-09 LAB — GLUCOSE, CAPILLARY
GLUCOSE-CAPILLARY: 116 mg/dL — AB (ref 70–99)
GLUCOSE-CAPILLARY: 232 mg/dL — AB (ref 70–99)
GLUCOSE-CAPILLARY: 89 mg/dL (ref 70–99)
Glucose-Capillary: 139 mg/dL — ABNORMAL HIGH (ref 70–99)

## 2014-08-09 LAB — BASIC METABOLIC PANEL
Anion gap: 9 (ref 5–15)
BUN: 39 mg/dL — AB (ref 6–23)
CO2: 26 mmol/L (ref 19–32)
CREATININE: 0.97 mg/dL (ref 0.50–1.10)
Calcium: 9 mg/dL (ref 8.4–10.5)
Chloride: 99 mmol/L (ref 96–112)
GFR calc Af Amer: 67 mL/min — ABNORMAL LOW (ref >90)
GFR, EST NON AFRICAN AMERICAN: 58 mL/min — AB (ref >90)
Glucose, Bld: 197 mg/dL — ABNORMAL HIGH (ref 70–99)
POTASSIUM: 4.1 mmol/L (ref 3.5–5.1)
Sodium: 134 mmol/L — ABNORMAL LOW (ref 135–145)

## 2014-08-09 LAB — CBC
HCT: 20.7 % — ABNORMAL LOW (ref 36.0–46.0)
Hemoglobin: 7 g/dL — ABNORMAL LOW (ref 12.0–15.0)
MCH: 28.3 pg (ref 26.0–34.0)
MCHC: 33.8 g/dL (ref 30.0–36.0)
MCV: 83.8 fL (ref 78.0–100.0)
Platelets: 279 10*3/uL (ref 150–400)
RBC: 2.47 MIL/uL — AB (ref 3.87–5.11)
RDW: 13.5 % (ref 11.5–15.5)
WBC: 10.1 10*3/uL (ref 4.0–10.5)

## 2014-08-09 LAB — PROTIME-INR
INR: 1.1 (ref 0.00–1.49)
Prothrombin Time: 14.4 seconds (ref 11.6–15.2)

## 2014-08-09 LAB — CBG MONITORING, ED: Glucose-Capillary: 199 mg/dL — ABNORMAL HIGH (ref 70–99)

## 2014-08-09 LAB — ABO/RH: ABO/RH(D): B POS

## 2014-08-09 LAB — PREPARE RBC (CROSSMATCH)

## 2014-08-09 LAB — MRSA PCR SCREENING: MRSA BY PCR: NEGATIVE

## 2014-08-09 SURGERY — EGD (ESOPHAGOGASTRODUODENOSCOPY)
Anesthesia: Moderate Sedation

## 2014-08-09 MED ORDER — SODIUM CHLORIDE 0.9 % IV SOLN
80.0000 mg | Freq: Once | INTRAVENOUS | Status: AC
  Administered 2014-08-09: 80 mg via INTRAVENOUS
  Filled 2014-08-09: qty 80

## 2014-08-09 MED ORDER — HYDRALAZINE HCL 20 MG/ML IJ SOLN: 10.0000 mg | INTRAMUSCULAR | Status: DC | PRN

## 2014-08-09 MED ORDER — ACETAMINOPHEN 650 MG RE SUPP: 650.0000 mg | Freq: Four times a day (QID) | RECTAL | Status: DC | PRN

## 2014-08-09 MED ORDER — ACETAMINOPHEN 325 MG PO TABS
650.0000 mg | ORAL_TABLET | Freq: Four times a day (QID) | ORAL | Status: DC | PRN
  Filled 2014-08-09: qty 2

## 2014-08-09 MED ORDER — SODIUM CHLORIDE 0.9 % IV BOLUS (SEPSIS)
1000.0000 mL | Freq: Once | INTRAVENOUS | Status: AC
  Administered 2014-08-09: 1000 mL via INTRAVENOUS

## 2014-08-09 MED ORDER — SODIUM CHLORIDE 0.9 % IV SOLN
8.0000 mg/h | INTRAVENOUS | Status: DC
  Administered 2014-08-09: 8 mg/h via INTRAVENOUS
  Filled 2014-08-09 (×2): qty 80

## 2014-08-09 MED ORDER — DIPHENHYDRAMINE HCL 50 MG/ML IJ SOLN
INTRAMUSCULAR | Status: AC
  Filled 2014-08-09: qty 1

## 2014-08-09 MED ORDER — BUTAMBEN-TETRACAINE-BENZOCAINE 2-2-14 % EX AERO
INHALATION_SPRAY | CUTANEOUS | Status: DC | PRN
  Administered 2014-08-09: 2 via TOPICAL

## 2014-08-09 MED ORDER — ALBUTEROL SULFATE (2.5 MG/3ML) 0.083% IN NEBU: 3.0000 mL | INHALATION_SOLUTION | RESPIRATORY_TRACT | Status: DC | PRN

## 2014-08-09 MED ORDER — MIDAZOLAM HCL 5 MG/ML IJ SOLN
INTRAMUSCULAR | Status: AC
  Filled 2014-08-09: qty 2

## 2014-08-09 MED ORDER — FENTANYL CITRATE 0.05 MG/ML IJ SOLN
INTRAMUSCULAR | Status: DC | PRN
  Administered 2014-08-09 (×2): 25 ug via INTRAVENOUS

## 2014-08-09 MED ORDER — PANTOPRAZOLE SODIUM 40 MG PO TBEC
40.0000 mg | DELAYED_RELEASE_TABLET | Freq: Every day | ORAL | Status: DC
  Administered 2014-08-10: 40 mg via ORAL
  Filled 2014-08-09: qty 1

## 2014-08-09 MED ORDER — INSULIN ASPART 100 UNIT/ML ~~LOC~~ SOLN
0.0000 [IU] | Freq: Three times a day (TID) | SUBCUTANEOUS | Status: DC
  Administered 2014-08-10: 1 [IU] via SUBCUTANEOUS

## 2014-08-09 MED ORDER — FENTANYL CITRATE 0.05 MG/ML IJ SOLN
INTRAMUSCULAR | Status: AC
  Filled 2014-08-09: qty 2

## 2014-08-09 MED ORDER — INSULIN ASPART 100 UNIT/ML ~~LOC~~ SOLN
0.0000 [IU] | SUBCUTANEOUS | Status: DC
  Administered 2014-08-09: 3 [IU] via SUBCUTANEOUS
  Administered 2014-08-09: 1 [IU] via SUBCUTANEOUS

## 2014-08-09 MED ORDER — ONDANSETRON HCL 4 MG PO TABS: 4.0000 mg | ORAL_TABLET | Freq: Four times a day (QID) | ORAL | Status: DC | PRN

## 2014-08-09 MED ORDER — ONDANSETRON HCL 4 MG/2ML IJ SOLN: 4.0000 mg | Freq: Four times a day (QID) | INTRAMUSCULAR | Status: DC | PRN

## 2014-08-09 MED ORDER — MIDAZOLAM HCL 10 MG/2ML IJ SOLN
INTRAMUSCULAR | Status: DC | PRN
  Administered 2014-08-09 (×5): 1 mg via INTRAVENOUS

## 2014-08-09 MED ORDER — HYDROMORPHONE HCL 1 MG/ML IJ SOLN
0.5000 mg | Freq: Once | INTRAMUSCULAR | Status: AC
  Administered 2014-08-09: 0.5 mg via INTRAVENOUS
  Filled 2014-08-09: qty 1

## 2014-08-09 MED ORDER — SODIUM CHLORIDE 0.9 % IV SOLN
INTRAVENOUS | Status: AC
  Administered 2014-08-09: 14:00:00 via INTRAVENOUS

## 2014-08-09 MED ORDER — SODIUM CHLORIDE 0.9 % IV SOLN: INTRAVENOUS | Status: DC

## 2014-08-09 MED ORDER — SODIUM CHLORIDE 0.9 % IV SOLN: 10.0000 mL/h | Freq: Once | INTRAVENOUS | Status: DC

## 2014-08-09 MED ORDER — PANTOPRAZOLE SODIUM 40 MG PO TBEC: 40.0000 mg | DELAYED_RELEASE_TABLET | Freq: Every day | ORAL | Status: DC

## 2014-08-09 NOTE — Consult Note (Signed)
Referring Provider:  [facility] Primary Care Physician:  Olga Millers, MD Primary Gastroenterologist:  Dr. Collene Mares  Reason for Consultation:   melena     HPI: Brooke [name] is a 69 y.o. female admitted via the ER earlier this morning with melena. Brooke [name] has a history of diabetes mellitus, hyperlipidemia, hypertension, and sarcoidosis. She reports that she is feeling very weak and tired over the past 3-4 weeks. She states that this past Wednesday she was sitting on the couch, and when she tried to get off the couch became dizzy and passed out. When she came to she found that she had "messed herself" her stool in her undergarments was very black and tarry. Yesterday she was trying to go to the bathroom and had a brief episode of syncope and again had jet black tarry stools. She came to the emergency room and was found to be tachycardic and mildly hypotensive with a hemoglobin around 7 which is down from her baseline of 10 one year ago. She denies use of aspirin or nonsteroidal anti-inflammatory drugs. She has not had any epigastric pain, nausea, or vomiting. She has no dysphagia. She does note that her appetite has been diminished over the past 3 weeks but she attributes this to stress as her husband was recently diagnosed with pancreatic cancer and is in hospice. She has a history of a pulmonary embolism many years ago but is not on anticoagulation. She states she had a screening colonoscopy along with an upper endoscopy by Dr. Collene Mares for 5 years ago. She says she was told she has diverticulosis but her endoscopies were otherwise normal.  Husband just placed into hospice due to pancreatic cancer. Past Medical History  Diagnosis Date  . Sarcoidosis   . 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 Peripheral neuropathy     uncertain relation to lung disease  . DVT (deep venous thrombosis)   . Pleural effusion   . Allergic rhinitis   . Diabetes mellitus without complication   .  Hypertension     Past Surgical History  Procedure Laterality Date  . Cholecystectomy  1975  . Foot surgery  2013    left  . Colonoscopy    . Esophagogastroduodenoscopy      Prior to Admission medications   Medication Sig Start Date End Date Taking? Authorizing Provider  albuterol (PROAIR HFA) 108 (90 BASE) MCG/ACT inhaler inhale 2 puffs by mouth every 4 hours if needed Patient taking differently: Inhale 2 puffs into the lungs every 4 (four) hours as needed for wheezing or shortness of breath.  01/14/14  Yes Deneise Lever, MD  ALPRAZolam Duanne Moron) 0.5 MG tablet Take 0.25 mg by mouth daily as needed for anxiety.    Yes Historical Provider, MD  aspirin 81 MG tablet Take 81 mg by mouth daily.   Yes Historical Provider, MD  cloNIDine (CATAPRES) 0.1 MG tablet Take 1 tablet (0.1 mg total) by mouth 2 (two) times daily. 07/13/14  Yes Olga Millers, MD  FLUoxetine (PROZAC) 10 MG tablet Take 1 tablet (10 mg total) by mouth daily. 07/13/14  Yes Olga Millers, MD  glimepiride (AMARYL) 2 MG tablet Take 1 tablet (2 mg total) by mouth daily before breakfast. 07/13/14  Yes Olga Millers, MD  multivitamin-iron-minerals-folic acid (CENTRUM) chewable tablet Chew 1 tablet by mouth daily.   Yes Historical Provider, MD  olmesartan-hydrochlorothiazide (BENICAR HCT) 40-25 MG per tablet Take 1 tablet by mouth daily. 07/13/14  Yes Olga Millers,  MD  Saxagliptin-Metformin (KOMBIGLYZE XR) 10-998 MG TB24 Take 1 tablet by mouth daily. 07/13/14  Yes Olga Millers, MD  CRESTOR 20 MG tablet Take 20 mg by mouth daily.  07/03/14   Historical Provider, MD    Current Facility-Administered Medications  Medication Dose Route Frequency Provider Last Rate Last Dose  . 0.9 %  sodium chloride infusion   Intravenous Continuous Rise Patience, MD      . 0.9 %  sodium chloride infusion   Intravenous Continuous Lori P Hvozdovic, PA-C      . acetaminophen (TYLENOL) tablet 650 mg  650 mg Oral Q6H PRN Rise Patience, MD       Or  . acetaminophen (TYLENOL) suppository 650 mg  650 mg Rectal Q6H PRN Rise Patience, MD      . albuterol (PROVENTIL) (2.5 MG/3ML) 0.083% nebulizer solution 3 mL  3 mL Inhalation Q4H PRN Rise Patience, MD      . hydrALAZINE (APRESOLINE) injection 10 mg  10 mg Intravenous Q4H PRN Rise Patience, MD      . insulin aspart (novoLOG) injection 0-9 Units  0-9 Units Subcutaneous Q4H Rise Patience, MD   1 Units at 08/09/14 [phone]  . ondansetron (ZOFRAN) tablet 4 mg  4 mg Oral Q6H PRN Rise Patience, MD       Or  . ondansetron [facility]) injection 4 mg  4 mg Intravenous Q6H PRN Rise Patience, MD      . pantoprazole (PROTONIX) 80 mg in sodium chloride 0.9 % 250 mL (0.32 mg/mL) infusion  8 mg/hr Intravenous Continuous Ankit Nanavati, MD 25 mL/hr at 08/09/14 0700 8 mg/hr at 08/09/14 0700    Allergies as of 08/09/2014 - Review Complete 08/09/2014  Allergen Reaction Noted  . Codeine Nausea And Vomiting     Family History  Problem Relation Age of Onset  . Hypertension Mother   . Colon cancer Neg Hx     History   Social History  . Marital Status: Single    Spouse Name: N/A  . Number of Children: N/A  . Years of Education: N/A   Occupational History  . Not on file.   Social History Main Topics  . Smoking status: Former Smoker -- 0.03 packs/day for 15 years    Types: Cigarettes    Quit date: 06/19/1982  . Smokeless tobacco: Never Used  . Alcohol Use: No  . Drug Use: No  . Sexual Activity: Not on file     Review of Systems: Gen: Has felt weak and fatigued x 3-4 weeks. CV: Denies chest pain, angina, palpitations, syncope, orthopnea, PND, peripheral edema, and claudication. Resp: Denies dyspnea at rest, dyspnea with exercise, cough, sputum, wheezing, coughing up blood, and pleurisy. GI: Denies vomiting blood, jaundice, and fecal incontinence.   Denies dysphagia or odynophagia. GU : Denies urinary burning, blood in urine, urinary  frequency, urinary hesitancy, nocturnal urination, and urinary incontinence. MS: Denies joint pain, limitation of movement, and swelling, stiffness, low back pain, extremity pain. Denies muscle weakness, cramps, atrophy.  Derm: Denies rash, itching, dry skin, hives, moles, warts, or unhealing ulcers.  Psych: Denies depression, anxiety, memory loss, suicidal ideation, hallucinations, paranoia, and confusion. Heme: Denies bruising, bleeding, and enlarged lymph nodes. Neuro:  Denies any headaches, dizziness, paresthesias. Endo:  Denies any problems with DM, thyroid, adrenal function.  Physical Exam: Vital signs in last 24 hours: Temp:  [98.1 F (36.7 C)-99.1 F (37.3 C)] 98.9 F (37.2 C) (02/21 1033) Pulse  Rate:  [95-121] 95 (02/21 1033) Resp:  [11-17] 12 (02/21 1033) BP: (95-138)/(49-71) 110/67 mmHg (02/21 1033) SpO2:  [95 %-100 %] 100 % (02/21 1033) Weight:  [136 lb 11 oz (62 kg)] 136 lb 11 oz (62 kg) (02/21 0557) Last BM Date: 08/08/14 General:   Alert,  Well-developed, well-nourished, pleasant and cooperative in NAD Head:  Normocephalic and atraumatic. Eyes:  Sclera clear, no icterus.   Conjunctiva pink. Ears:  Normal auditory acuity. Nose:  No deformity, discharge,  or lesions. Mouth:  No deformity or lesions.   Neck:  Supple; no masses or thyromegaly. Lungs:  Clear throughout to auscultation.   No wheezes, crackles, or rhonchi  Heart:  Regular rate and rhythm; no murmurs. Abdomen:  Soft,nontender, BS active,nonpalp mass or hsm.   Rectal:  Deferred . Pt had guiac positive stool in ER earlier today. Msk:  Symmetrical without gross deformities. . Pulses:  Normal pulses noted. Extremities:  Without clubbing or edema. Neurologic:  Alert and  oriented x4;  grossly normal neurologically. Skin:  Intact without significant lesions or rashes.. Psych:  Alert and cooperative. Normal mood and affect.  Lab Results:  Recent Labs  08/09/14 0225  WBC 10.1  HGB 7.0*  HCT 20.7*  PLT 279    BMET  Recent Labs  08/09/14 0225  NA 134*  K 4.1  CL 99  CO2 26  GLUCOSE 197*  BUN 39*  CREATININE 0.97  CALCIUM 9.0       IMPRESSION/PLAN: 69 year old female admitted with acute GI bleed most likely upper as evidenced by her melena. BUN slightly elevated at 39. Patient has received 1 unit of packed red blood cells thus far and has a second unit ordered. She will have a repeat CBC after transfusion. Her syncope was likely secondary to her blood loss. Plan is to continue PPI this point. Will review with Dr. Carlean Purl as to timing of upper endoscopy to evaluate for gastritis, ulcers, etc.    Silvano Rusk PA-C 08/09/2014,  Pager [place] Attending  I have also seen and assessed the patient and agree with the above note. UGI bleed w/ melena and acute blood loss anemia Will pan for EGD today - possible bleeding Tx.  The risks and benefits as well as alternatives of endoscopic procedure(s) have been discussed and reviewed. All questions answered. The patient agrees to proceed.  Gatha Mayer, MD, [facility] [phone] (pager) 08/09/2014 11:15 AM

## 2014-08-09 NOTE — ED Notes (Signed)
Pt arrives via EMS with c/o several syncopal episodes throughout the day, no orthostatic changes. CBG 191. Lethargic throughout the day, LOC with sitting and standing. Loose stools (black) throughout the day.

## 2014-08-09 NOTE — ED Notes (Signed)
Unable to do orthostatitic vitals with pt standing up due to dizziness

## 2014-08-09 NOTE — ED Notes (Signed)
Pt placed in a gown and hooked up to monitor with 12 lead, BP cuff and pulse ox

## 2014-08-09 NOTE — ED Notes (Signed)
Pt CBG 199 RN notified

## 2014-08-09 NOTE — ED Provider Notes (Addendum)
CSN: [id number]     Arrival date & time 08/09/14  0159 History  This chart was scribed for Varney Biles, MD by Chester Holstein, ED Scribe. This patient was seen in room A06C/A06C and the patient's care was started at 2:33 AM.      Chief Complaint  Patient presents with  . Loss of Consciousness      Patient is a 69 y.o. female presenting with syncope. The history is provided by the patient. No language interpreter was used.  Loss of Consciousness Associated symptoms: dizziness, malaise/fatigue and shortness of breath     HPI Comments: Brooke [name] is a 69 y.o. female with PMhx of sarcoidosis, peripheral neuropathy, DVT, pleural effusion, allergic rhinitis, who presents to the Emergency Department complaining of LOC today PTA. Pt notes 2 episodes of near-syncope with associated symptoms with first onset a week ago, worsening 3 days ago. Pt notes associated intermittent sense of malaise, chest tightness, lightheadedness, dizziness, loss of balance, loose tarry stools and urinary frequency. Pt notes recent stressors, stating husband has been placed on hospice in last 2 days.  Pt with h/o of diverticulosis and remote hx of stomach ulcers. Pt denies h/o MI. Pt denied SOB different than baseline but experienced DOE in exam room. Pt denies abdominal pain.    ROS 10 Systems reviewed and are negative for acute change except as noted in the HPI.      Past Medical History  Diagnosis Date  . Sarcoidosis   . 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 Peripheral neuropathy     uncertain relation to lung disease  . DVT (deep venous thrombosis)   . Pleural effusion   . Allergic rhinitis    Past Surgical History  Procedure Laterality Date  . Cholecystectomy  1975  . Foot surgery  2013    left   Family History  Problem Relation Age of Onset  . Family history unknown: Yes   History  Substance Use Topics  . Smoking status: Former Smoker -- 0.03 packs/day for 15 years    Types: Cigarettes     Quit date: 06/19/1982  . Smokeless tobacco: Never Used  . Alcohol Use: Not on file   OB History    No data available     Review of Systems  Constitutional: Positive for malaise/fatigue.  Respiratory: Positive for chest tightness and shortness of breath.   Cardiovascular: Positive for syncope.  Gastrointestinal: Positive for blood in stool. Negative for abdominal pain.  Genitourinary: Positive for frequency.  Neurological: Positive for dizziness, syncope and light-headedness.  All other systems reviewed and are negative.     Allergies  Codeine  Home Medications   Prior to Admission medications   Medication Sig Start Date End Date Taking? Authorizing Provider  albuterol (PROAIR HFA) 108 (90 BASE) MCG/ACT inhaler inhale 2 puffs by mouth every 4 hours if needed Patient taking differently: Inhale 2 puffs into the lungs every 4 (four) hours as needed for wheezing or shortness of breath.  01/14/14  Yes Deneise Lever, MD  ALPRAZolam Duanne Moron) 0.5 MG tablet Take 0.25 mg by mouth daily as needed for anxiety.    Yes Historical Provider, MD  aspirin 81 MG tablet Take 81 mg by mouth daily.   Yes Historical Provider, MD  cloNIDine (CATAPRES) 0.1 MG tablet Take 1 tablet (0.1 mg total) by mouth 2 (two) times daily. 07/13/14  Yes Olga Millers, MD  FLUoxetine (PROZAC) 10 MG tablet Take 1 tablet (10 mg total) by mouth daily.  07/13/14  Yes Olga Millers, MD  glimepiride (AMARYL) 2 MG tablet Take 1 tablet (2 mg total) by mouth daily before breakfast. 07/13/14  Yes Olga Millers, MD  multivitamin-iron-minerals-folic acid (CENTRUM) chewable tablet Chew 1 tablet by mouth daily.   Yes Historical Provider, MD  olmesartan-hydrochlorothiazide (BENICAR HCT) 40-25 MG per tablet Take 1 tablet by mouth daily. 07/13/14  Yes Olga Millers, MD  Saxagliptin-Metformin (KOMBIGLYZE XR) 10-998 MG TB24 Take 1 tablet by mouth daily. 07/13/14  Yes Olga Millers, MD  CRESTOR 20 MG tablet Take 20 mg  by mouth daily.  07/03/14   Historical Provider, MD   BP 95/54 mmHg  Pulse 104  Temp(Src) 98.1 F (36.7 C) (Oral)  Resp 15  SpO2 100% Physical Exam  Constitutional: She is oriented to person, place, and time. She appears well-developed and well-nourished.  HENT:  Head: Normocephalic.  Eyes: Conjunctivae are normal.  Neck: Normal range of motion. Neck supple.  Cardiovascular: Regular rhythm and normal heart sounds.  Tachycardia present.   Abdominal: She exhibits no distension. There is tenderness in the left lower quadrant.  Musculoskeletal: Normal range of motion. She exhibits no edema or tenderness.  Neurological: She is alert and oriented to person, place, and time.  Skin: Skin is warm and dry.  Psychiatric: She has a normal mood and affect. Her behavior is normal.  Nursing note and vitals reviewed.   ED Course  Procedures (including critical care time) DIAGNOSTIC STUDIES: Oxygen Saturation is 100% on room air, normal by my interpretation.    COORDINATION OF CARE: 2:47 AM Discussed treatment plan with patient at beside, the patient agrees with the plan and has no further questions at this time.   Labs Review Labs Reviewed  CBC - Abnormal; Notable for the following:    RBC 2.47 (*)    Hemoglobin 7.0 (*)    HCT 20.7 (*)    All other components within normal limits  BASIC METABOLIC PANEL - Abnormal; Notable for the following:    Sodium 134 (*)    Glucose, Bld 197 (*)    BUN 39 (*)    GFR calc non Af Amer 58 (*)    GFR calc Af Amer 67 (*)    All other components within normal limits  CBG MONITORING, ED - Abnormal; Notable for the following:    Glucose-Capillary 199 (*)    All other components within normal limits  POC OCCULT BLOOD, ED - Abnormal; Notable for the following:    Fecal Occult Bld POSITIVE (*)    All other components within normal limits  TYPE AND SCREEN  ABO/RH  PREPARE RBC (CROSSMATCH)    Imaging Review No results found.   EKG  Interpretation None       Date: 08/09/2014  Rate: 105  Rhythm: sinus tachycardia  QRS Axis: normal  Intervals: normal  ST/T Wave abnormalities: nonspecific ST/T changes  Conduction Disutrbances:none  Narrative Interpretation:   Old EKG Reviewed: unchanged    MDM   Final diagnoses:  Syncope and collapse  Melena  Upper GI bleed  Symptomatic anemia    I personally performed the services described in this documentation, which was scribed in my presence. The recorded information has been reviewed and is accurate.  DDx includes: Esophagitis Mallory Weiss tear [place]  Variceal bleeding PUD/Gastritis/ulcers Diverticular bleed Colon cancer Rectal bleed Internal hemorrhoids External hemorrhoids   Pt comes in with syncope x 3 episodes over the past week and dark tarry stools. She is noted  to be tachycardic here. With minimal exertion patient was dyspneic and her HR went to 130s. Pt not on anti platelet or anticoagulant. Hb is 7, drop from 11+ - which is in records from 2015. Also BUN is 39, and exam shows melena like stools.  Suspicion higher for upper gi source or the bleed. Protonix drip initiated. Pt also given a dose of erythromycin to aide with endoscopy.  Pt is essentially having symptomatic anemia. Blood transfusion to be initiated here.   Spoke with the GI team - they will see pt in the AM. Hospitalist to admit to stepdown. RN to get 2 large bore iv access.  CRITICAL CARE Performed by: Varney Biles   Total critical care time: 43 min  Critical care time was exclusive of separately billable procedures and treating other patients.  Critical care was necessary to treat or prevent imminent or life-threatening deterioration.  Critical care was time spent personally by me on the following activities: development of treatment plan with patient and/or surrogate as well as nursing, discussions with consultants, evaluation of patient's response to treatment,  examination of patient, obtaining history from patient or surrogate, ordering and performing treatments and interventions, ordering and review of laboratory studies, ordering and review of radiographic studies, pulse oximetry and re-evaluation of patient's condition.     Varney Biles, MD 08/09/14 [place], MD 08/09/14 2035

## 2014-08-09 NOTE — ED Notes (Signed)
Pt attempting to use restroom at this time.

## 2014-08-09 NOTE — ED Notes (Signed)
Attempted to acquire second IV for blood transfusion, unsuccessful x2.

## 2014-08-09 NOTE — Progress Notes (Signed)
PROGRESS NOTE  GEARLDEAN LOMANTO OPF:[id number] DOB: Jul 24, 1945 DOA: 08/09/2014 PCP: Olga Millers, MD  HPI/Recap of past 24 hours: Feeling slightly better, still weak, denies pain, no n/v, no active bleed  Assessment/Plan: Principal Problem:   GI bleed Active Problems:   SARCOIDOSIS, PULMONARY   Essential hypertension   Hyperlipidemia   Syncope and collapse   Diabetes mellitus type 2, controlled   Symptomatic anemia  1. GI bleed/melena - most likely upper GI given the melanotic stools. GI consulted, EGD 2/21. Patient has been kept nothing by mouth and on Protonix infusion. 2 units of packed red blood cell transfusion has been ordered. Repeat CBC after transfusion. Continue with gentle hydration. Hold antihypertensives for now. 2. Hypertension - since patient's blood pressure was mildly low on admission, continue hold antihypertensives, gentel hydration and transfuse the RBC. When necessary IV hydralazine for systolic blood pressure more than 160. 3. Blood loss anemia - follow CBC after transfusion. 4. Syncope secondary to acute GI bleed and blood loss. 5. Diabetes mellitus type 2 - since patient is nothing by mouth I have placed patient on sliding scale coverage for now closely follow CBGs. a1c 7.9 6. Hyperlipidemia - continue medications once patient can take orally. 7. History of sarcoidosis presently on as needed inhaler.  Code Status: full  Family Communication: patient  Disposition Plan: home when stable   Consultants:  GI  Procedures:  EGD 2/21  Antibiotics:  none   Objective: BP 110/67 mmHg  Pulse 95  Temp(Src) 98.9 F (37.2 C) (Oral)  Resp 12  Ht 5\' 3"  (1.6 m)  Wt 62 kg (136 lb 11 oz)  BMI 24.22 kg/m2  SpO2 100%  Intake/Output Summary (Last 24 hours) at 08/09/14 1156 Last data filed at 08/09/14 1150  Gross per 24 hour  Intake 1671.92 ml  Output    350 ml  Net 1321.92 ml   Filed Weights   08/09/14 0557  Weight: 62 kg (136 lb 11 oz)     Exam: 8. General: Moderately built and nourished. 9. Eyes: Anicteric mild pallor. 10. ENT: No discharge from the ears eyes nose and mouth. 11. Neck: No mass felt. 12. Cardiovascular: S1 and S2 heard. 13. Respiratory: No rhonchi or crepitations. 14. Abdomen: Soft nontender bowel sounds present. 15. Skin: Appears pale. 16. Musculoskeletal: No edema. 17. Psychiatric: Appears normal. 18. Neurologic: Alert awake oriented to time place and person. Moves all extremities   Data Reviewed: Basic Metabolic Panel:  Recent Labs Lab 08/09/14 0225  NA 134*  K 4.1  CL 99  CO2 26  GLUCOSE 197*  BUN 39*  CREATININE 0.97  CALCIUM 9.0   Liver Function Tests: No results for input(s): AST, ALT, ALKPHOS, BILITOT, PROT, ALBUMIN in the last 168 hours. No results for input(s): LIPASE, AMYLASE in the last 168 hours. No results for input(s): AMMONIA in the last 168 hours. CBC:  Recent Labs Lab 08/09/14 0225  WBC 10.1  HGB 7.0*  HCT 20.7*  MCV 83.8  PLT 279   Cardiac Enzymes:   No results for input(s): CKTOTAL, CKMB, CKMBINDEX, TROPONINI in the last 168 hours. BNP (last 3 results) No results for input(s): BNP in the last 8760 hours.  ProBNP (last 3 results) No results for input(s): PROBNP in the last 8760 hours.  CBG:  Recent Labs Lab 08/09/14 0223 08/09/14 0902  GLUCAP 199* 139*    Recent Results (from the past 240 hour(s))  MRSA PCR Screening     Status: None   Collection  Time: 08/09/14  6:29 AM  Result Value Ref Range Status   MRSA by PCR NEGATIVE NEGATIVE Final    Comment:        The GeneXpert MRSA Assay (FDA approved for NASAL specimens only), is one component of a comprehensive MRSA colonization surveillance program. It is not intended to diagnose MRSA infection nor to guide or monitor treatment for MRSA infections.      Studies: No results found.  Scheduled Meds: . insulin aspart  0-9 Units Subcutaneous Q4H    Continuous Infusions: . sodium  chloride    . sodium chloride    . pantoprozole (PROTONIX) infusion 8 mg/hr (08/09/14 0700)     Cadarius Nevares  [facility] Pager [phone]. If 7PM-7AM, please contact night-coverage at [email or web address], password [facility] 08/09/2014, 11:56 AM  LOS: 0 days

## 2014-08-09 NOTE — ED Notes (Signed)
Attempted report 

## 2014-08-09 NOTE — ED Notes (Signed)
Dr. Nanavati at bedside 

## 2014-08-09 NOTE — Discharge Summary (Signed)
Discharge Summary  Brooke [name] WKM:[id number] DOB: Nov 17, 1945  PCP: Olga Millers, MD  Admit date: 08/09/2014 Discharge date: 08/10/2014  Time spent: less than 47mins  Recommendations for Outpatient Follow-up:  1. F/u with pmd in 2wks to further adjust bp/diabetic meds.  2. F/u with gi in 1wk for h pylori testing result.  Discharge Diagnoses:  Active Hospital Problems   Diagnosis Date Noted  . GI bleed 08/09/2014  . Syncope and collapse 08/09/2014  . Diabetes mellitus type 2, controlled 08/09/2014  . Erosive gastritis with hemorrhage 08/09/2014  . Symptomatic anemia   . Essential hypertension 07/13/2014  . Hyperlipidemia 07/13/2014  . SARCOIDOSIS, PULMONARY 07/18/2007    Resolved Hospital Problems   Diagnosis Date Noted Date Resolved  No resolved problems to display.    Discharge Condition: stable  Diet recommendation: heart healthy/carb modified, avoid spicy  Filed Weights   08/09/14 0557  Weight: 62 kg (136 lb 11 oz)    History of present illness:  Brooke [name] is a 69 y.o. female with history of hypertension, diabetes mellitus, hyperlipidemia and sarcoidosis has been feeling weak and fatigued over the last few weeks. Patient while trying to go to the bathroom had a brief episode of syncope but patient states she did not hit her head. After the episode patient found that she had a bowel movement which was dark and tarry. Patient also had a similar episode of dark tarry stool 3-4 days ago. In the ER patient was found to be mildly hypotensive and tachycardic with hemoglobin around 7 which is a 3 g drop from last year. Patient denies taking any NSAIDs or aspirin. Patient's stool for occult blood was positive and was found to be melanotic also on exam by the ER physician. ER physician had discussed with on-call gastroenterologist who will be seeing patient in consult and patient will be admitted for GI bleed. Patient states she has had a previous episode of GI bleed  and had endoscopy and coloscopy done by Dr. Collene Mares a few years ago which showed diverticula. Patient denies any chest pain or shortness of breath nausea vomiting abdominal pain or any recent use of antibiotics.   Hospital Course:  Principal Problem:   GI bleed Active Problems:   SARCOIDOSIS, PULMONARY   Essential hypertension   Hyperlipidemia   Syncope and collapse   Diabetes mellitus type 2, controlled   Symptomatic anemia   Erosive gastritis with hemorrhage 8.  GI bleed/melena - upper GI bleed with melanotic stools. GI consulted, EGD 2/21.            One small superficial ulcer and multiple erosions in gastric antrum abnd distal body. all clean-based.          GI recommended pantoprazole 40 mg daily. Hpylori testing result pending, patient to f/u with GI, treat hpylori if positive. Blood loss anemia - prbcx2 h/h stable at time of discharge.   Syncope ,secondary to acute GI bleed and blood loss  Hypertension -  blood pressure was mildly low on admission, antihypertensives on hold initially, was on clonidine only at home. bp meds adjusted at time of discharge. D/c clonidine. Continue benicar/hctz. Advise patient to do home bp monitoring and f/u with pmd for further bp meds.  Diabetes mellitus type 2 - a1c 7.9. Patient reported noncompliant with diet, she is going to modify diet and continue to work with pmd to adjust diabetic meds. goal of a1c less than 7   hyperlipidemia - continue medications    History  of sarcoidosis presently on as needed inhaler  Consultants: GI  Procedures:  EGD 2/21  Antibiotics:  none   Discharge Exam: BP 145/86 mmHg  Pulse 88  Temp(Src) 97.9 F (36.6 C) (Oral)  Resp 14  Ht 5\' 3"  (1.6 m)  Wt 62 kg (136 lb 11 oz)  BMI 24.22 kg/m2  SpO2 99%  General: Moderately built and nourished. Eyes: Anicteric mild pallor. ENT: No discharge from the ears eyes nose and mouth. Neck: No mass felt. Cardiovascular: S1 and S2 heard. Respiratory: No rhonchi  or crepitations. Abdomen: Soft nontender bowel sounds present. Skin: Appears pale. Musculoskeletal: No edema. Psychiatric: Appears normal. Neurologic: Alert awake oriented to time place and person. Moves all extremities  Discharge Instructions You were cared for by a hospitalist during your hospital stay. If you have any questions about your discharge medications or the care you received while you were in the hospital after you are discharged, you can call the unit and asked to speak with the hospitalist on call if the hospitalist that took care of you is not available. Once you are discharged, your primary care physician will handle any further medical issues. Please note that NO REFILLS for any discharge medications will be authorized once you are discharged, as it is imperative that you return to your primary care physician (or establish a relationship with a primary care physician if you do not have one) for your aftercare needs so that they can reassess your need for medications and monitor your lab values.      Discharge Instructions    Diet - low sodium heart healthy    Complete by:  As directed      Increase activity slowly    Complete by:  As directed             Medication List    STOP taking these medications        cloNIDine 0.1 MG tablet  Commonly known as:  CATAPRES     glimepiride 2 MG tablet  Commonly known as:  AMARYL      TAKE these medications        albuterol 108 (90 BASE) MCG/ACT inhaler  Commonly known as:  PROAIR HFA  inhale 2 puffs by mouth every 4 hours if needed     ALPRAZolam 0.5 MG tablet  Commonly known as:  XANAX  Take 0.25 mg by mouth daily as needed for anxiety.     aspirin 81 MG tablet  Take 81 mg by mouth daily.     CRESTOR 20 MG tablet  Generic drug:  rosuvastatin  Take 20 mg by mouth daily.     FLUoxetine 10 MG tablet  Commonly known as:  PROZAC  Take 1 tablet (10 mg total) by mouth daily.     multivitamin-iron-minerals-folic  acid chewable tablet  Chew 1 tablet by mouth daily.     olmesartan-hydrochlorothiazide 40-25 MG per tablet  Commonly known as:  BENICAR HCT  Take 1 tablet by mouth daily.     pantoprazole 40 MG tablet  Commonly known as:  PROTONIX  Take 1 tablet (40 mg total) by mouth daily before breakfast.     Saxagliptin-Metformin 10-998 MG Tb24  Commonly known as:  KOMBIGLYZE XR  Take 1 tablet by mouth daily.       Allergies  Allergen Reactions  . Codeine Nausea And Vomiting   Follow-up Information    Follow up with MANN,JYOTHI, MD In 4 weeks.   Specialty:  Gastroenterology  Contact information:   [address] [place] [address] [phone]       Follow up with Olga Millers, MD In 2 weeks.   Specialty:  Internal Medicine   Contact information:   [address] [place] [address] [phone]       Follow up with MANN,JYOTHI, MD In 1 week.   Specialty:  Gastroenterology   Contact information:   [address] [place] [address] [phone]        The results of significant diagnostics from this hospitalization (including imaging, microbiology, ancillary and laboratory) are listed below for reference.    Significant Diagnostic Studies: No results found.  Microbiology: Recent Results (from the past 240 hour(s))  MRSA PCR Screening     Status: None   Collection Time: 08/09/14  6:29 AM  Result Value Ref Range Status   MRSA by PCR NEGATIVE NEGATIVE Final    Comment:        The GeneXpert MRSA Assay (FDA approved for NASAL specimens only), is one component of a comprehensive MRSA colonization surveillance program. It is not intended to diagnose MRSA infection nor to guide or monitor treatment for MRSA infections.      Labs: Basic Metabolic Panel:  Recent Labs Lab 08/09/14 0225 08/10/14 0235  NA 134* 136  K 4.1 4.1  CL 99 106  CO2 26 25  GLUCOSE 197* 154*  BUN 39* 15  CREATININE 0.97 0.82  CALCIUM  9.0 8.2*   Liver Function Tests: No results for input(s): AST, ALT, ALKPHOS, BILITOT, PROT, ALBUMIN in the last 168 hours. No results for input(s): LIPASE, AMYLASE in the last 168 hours. No results for input(s): AMMONIA in the last 168 hours. CBC:  Recent Labs Lab 08/09/14 0225 08/10/14 0235  WBC 10.1 8.0  HGB 7.0* 9.3*  HCT 20.7* 27.4*  MCV 83.8 85.9  PLT 279 197   Cardiac Enzymes: No results for input(s): CKTOTAL, CKMB, CKMBINDEX, TROPONINI in the last 168 hours. BNP: BNP (last 3 results) No results for input(s): BNP in the last 8760 hours.  ProBNP (last 3 results) No results for input(s): PROBNP in the last 8760 hours.  CBG:  Recent Labs Lab 08/09/14 0223 08/09/14 0902 08/09/14 1133 08/09/14 1655 08/09/14 2143  GLUCAP 199* 139* 116* 232* 89       Signed:  Catarina Huntley  [facility] 08/10/2014, 11:01 AM

## 2014-08-09 NOTE — ED Notes (Signed)
Recent stress - put husband on hospice two days ago

## 2014-08-09 NOTE — Op Note (Signed)
[facility] [place], [id number]   ENDOSCOPY PROCEDURE REPORT  PATIENT: Brooke [name], Brooke [name]  MR#: [id number] BIRTHDATE: 05-08-1946 , 68  yrs. old GENDER: female ENDOSCOPIST: Gatha Mayer, MD, [facility] PROCEDURE DATE:  08/09/2014 PROCEDURE:  EGD w/ biopsy for H.pylori ASA CLASS:     Class III INDICATIONS:  melena. MEDICATIONS: Fentanyl 50 mcg IV and Versed 5 mg IV TOPICAL ANESTHETIC: Cetacaine Spray  DESCRIPTION OF PROCEDURE: After the risks benefits and alternatives of the procedure were thoroughly explained, informed consent was obtained.  The Pentax Gastroscope [id number] endoscope was introduced through the mouth and advanced to the second portion of the duodenum , Without limitations.  The instrument was slowly withdrawn as the mucosa was fully examined.    1) One small superficial ulcer and multiple erosions in gastric antrum abnd distal body.  all clean-based. 2) Otherwise normal esophagus, stomach and duodenum.  CLO test biopsy taken from antrum + distal body.  Retroflexed views revealed as previously described.     The scope was then withdrawn from the patient and the procedure completed.  COMPLICATIONS: There were no immediate complications.  ENDOSCOPIC IMPRESSION: 1) One small superficial ulcer and multiple erosions in gastric antrum abnd distal body.  all clean-based. 2) Otherwise normal esophagus, stomach and duodenum.  CLO test biopsy taken from antrum + distal body  RECOMMENDATIONS: 1) treat H.  pylori if + 2) stay on PPI - change to pantoprazole 40 mg daily (done) 3) carmb mod diet ordered 4) home 1-2 days - from these findings ok to go tomorrow but anemia and vol status need to be ok which could take longer 5) Dr.  Collene Mares will see tomorrow   eSigned:  Gatha Mayer, MD, [facility] 08/09/2014 12:58 PM    CC: Juanita Craver, MD

## 2014-08-09 NOTE — Progress Notes (Signed)
Utilization review completed.  

## 2014-08-09 NOTE — H&P (Signed)
[facility] History and Physical  Brooke [name] XKG:[id number] DOB: [birth year] DOA: 08/09/2014  Referring physician: ER physician. PCP: Olga Millers, MD   Chief Complaint: Melena and loss of consciousness.  HPI: Brooke [name] is a 69 y.o. female with history of hypertension, diabetes mellitus, hyperlipidemia and sarcoidosis has been feeling weak and fatigued over the last few weeks. Patient while trying to go to the bathroom had a brief episode of syncope but patient states she did not hit her head. After the episode patient found that she had a bowel movement which was dark and tarry. Patient also had a similar episode of dark tarry stool 3-4 days ago. In the ER patient was found to be mildly hypotensive and tachycardic with hemoglobin around 7 which is a 3 g drop from last year. Patient denies taking any NSAIDs or aspirin. Patient's stool for occult blood was positive and was found to be melanotic also on exam by the ER physician. ER physician had discussed with on-call gastroenterologist who will be seeing patient in consult and patient will be admitted for GI bleed. Patient states she has had a previous episode of GI bleed and had endoscopy and coloscopy done by Dr. Collene Mares a few years ago which showed diverticula. Patient denies any chest pain or shortness of breath nausea vomiting abdominal pain or any recent use of antibiotics.   Review of Systems: As presented in the history of presenting illness, rest negative.  Past Medical History  Diagnosis Date  . Sarcoidosis   . 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 Peripheral neuropathy     uncertain relation to lung disease  . DVT (deep venous thrombosis)   . Pleural effusion   . Allergic rhinitis   . Diabetes mellitus without complication   . Hypertension    Past Surgical History  Procedure Laterality Date  . Cholecystectomy  1975  . Foot surgery  2013    left   Social History:  reports that she quit smoking about 32  years ago. Her smoking use included Cigarettes. She has a .45 pack-year smoking history. She has never used smokeless tobacco. She reports that she does not drink alcohol or use illicit drugs. Where does patient live home. Can patient participate in ADLs? Yes.  Allergies  Allergen Reactions  . Codeine Nausea And Vomiting    Family History:  Family History  Problem Relation Age of Onset  . Hypertension Mother   . Colon cancer Neg Hx       Prior to Admission medications   Medication Sig Start Date End Date Taking? Authorizing Provider  albuterol (PROAIR HFA) 108 (90 BASE) MCG/ACT inhaler inhale 2 puffs by mouth every 4 hours if needed Patient taking differently: Inhale 2 puffs into the lungs every 4 (four) hours as needed for wheezing or shortness of breath.  01/14/14  Yes Deneise Lever, MD  ALPRAZolam Duanne Moron) 0.5 MG tablet Take 0.25 mg by mouth daily as needed for anxiety.    Yes Historical Provider, MD  aspirin 81 MG tablet Take 81 mg by mouth daily.   Yes Historical Provider, MD  cloNIDine (CATAPRES) 0.1 MG tablet Take 1 tablet (0.1 mg total) by mouth 2 (two) times daily. 07/13/14  Yes Olga Millers, MD  FLUoxetine (PROZAC) 10 MG tablet Take 1 tablet (10 mg total) by mouth daily. 07/13/14  Yes Olga Millers, MD  glimepiride (AMARYL) 2 MG tablet Take 1 tablet (2 mg total) by mouth daily before breakfast. 07/13/14  Yes Olga Millers, MD  multivitamin-iron-minerals-folic acid (CENTRUM) chewable tablet Chew 1 tablet by mouth daily.   Yes Historical Provider, MD  olmesartan-hydrochlorothiazide (BENICAR HCT) 40-25 MG per tablet Take 1 tablet by mouth daily. 07/13/14  Yes Olga Millers, MD  Saxagliptin-Metformin (KOMBIGLYZE XR) 10-998 MG TB24 Take 1 tablet by mouth daily. 07/13/14  Yes Olga Millers, MD  CRESTOR 20 MG tablet Take 20 mg by mouth daily.  07/03/14   Historical Provider, MD    Physical Exam: Filed Vitals:   08/09/14 0445 08/09/14 0532 08/09/14 0552 08/09/14  0557  BP: 114/57 138/53 115/59   Pulse: 102 107 106   Temp:  99 F (37.2 C) 98.8 F (37.1 C)   TempSrc:  Oral Oral   Resp: 11  14   Height:    5\' 3"  (1.6 m)  Weight:    62 kg (136 lb 11 oz)  SpO2: 96% 95% 99%      General:  Moderately built and nourished.  Eyes: Anicteric mild pallor.  ENT: No discharge from the ears eyes nose and mouth.  Neck: No mass felt.  Cardiovascular: S1 and S2 heard.  Respiratory: No rhonchi or crepitations.  Abdomen: Soft nontender bowel sounds present.  Skin: Appears pale.  Musculoskeletal: No edema.  Psychiatric: Appears normal.  Neurologic: Alert awake oriented to time place and person. Moves all extremities.  Labs on Admission:  Basic Metabolic Panel:  Recent Labs Lab 08/09/14 0225  NA 134*  K 4.1  CL 99  CO2 26  GLUCOSE 197*  BUN 39*  CREATININE 0.97  CALCIUM 9.0   Liver Function Tests: No results for input(s): AST, ALT, ALKPHOS, BILITOT, PROT, ALBUMIN in the last 168 hours. No results for input(s): LIPASE, AMYLASE in the last 168 hours. No results for input(s): AMMONIA in the last 168 hours. CBC:  Recent Labs Lab 08/09/14 0225  WBC 10.1  HGB 7.0*  HCT 20.7*  MCV 83.8  PLT 279   Cardiac Enzymes: No results for input(s): CKTOTAL, CKMB, CKMBINDEX, TROPONINI in the last 168 hours.  BNP (last 3 results) No results for input(s): BNP in the last 8760 hours.  ProBNP (last 3 results) No results for input(s): PROBNP in the last 8760 hours.  CBG:  Recent Labs Lab 08/09/14 0223  GLUCAP 199*    Radiological Exams on Admission: No results found.  EKG: Independently reviewed. Sinus tachycardia.  Assessment/Plan Principal Problem:   GI bleed Active Problems:   SARCOIDOSIS, PULMONARY   Essential hypertension   Hyperlipidemia   Syncope and collapse   Diabetes mellitus type 2, controlled   Symptomatic anemia   1. Acute GI bleed - most likely upper GI given the melanotic stools. GI on-call was consulted by  ER physician and will be seeing patient in consult. Patient has been kept nothing by mouth and on Protonix infusion. 2 units of packed red blood cell transfusion has been ordered. Repeat CBC after transfusion. Continue with gentle hydration. Hold antihypertensives for now. 2. Hypertension - since patient's blood pressure was mildly low on admission we will hold antihypertensives were gently hydrate and transfuse the RBC. When necessary IV hydralazine for systolic blood pressure more than 160. 3. Blood loss anemia - follow CBC after transfusion. 4. Syncope secondary to acute GI bleed and blood loss. 5. Diabetes mellitus type 2 - since patient is nothing by mouth I have placed patient on sliding scale coverage for now closely follow CBGs. 6. Hyperlipidemia - continue medications once patient can take  orally. 7. History of sarcoidosis presently on as needed inhaler.   DVT Prophylaxis SCDs.  Code Status: Full code.  Family Communication: Patient's daughter at the bedside.  Disposition Plan: Admit to inpatient.    Keaun Schnabel N. [facility] Pager [phone].  If 7PM-7AM, please contact night-coverage [email or web address] Password [facility] 08/09/2014, 5:59 AM

## 2014-08-10 ENCOUNTER — Telehealth: Payer: Self-pay | Admitting: *Deleted

## 2014-08-10 ENCOUNTER — Encounter (HOSPITAL_COMMUNITY): Payer: Self-pay | Admitting: Internal Medicine

## 2014-08-10 DIAGNOSIS — E785 Hyperlipidemia, unspecified: Secondary | ICD-10-CM

## 2014-08-10 DIAGNOSIS — I10 Essential (primary) hypertension: Secondary | ICD-10-CM

## 2014-08-10 LAB — BASIC METABOLIC PANEL
ANION GAP: 5 (ref 5–15)
BUN: 15 mg/dL (ref 6–23)
CALCIUM: 8.2 mg/dL — AB (ref 8.4–10.5)
CO2: 25 mmol/L (ref 19–32)
CREATININE: 0.82 mg/dL (ref 0.50–1.10)
Chloride: 106 mmol/L (ref 96–112)
GFR calc Af Amer: 82 mL/min — ABNORMAL LOW (ref >90)
GFR calc non Af Amer: 71 mL/min — ABNORMAL LOW (ref >90)
Glucose, Bld: 154 mg/dL — ABNORMAL HIGH (ref 70–99)
Potassium: 4.1 mmol/L (ref 3.5–5.1)
Sodium: 136 mmol/L (ref 135–145)

## 2014-08-10 LAB — GLUCOSE, CAPILLARY
GLUCOSE-CAPILLARY: 141 mg/dL — AB (ref 70–99)
Glucose-Capillary: 117 mg/dL — ABNORMAL HIGH (ref 70–99)

## 2014-08-10 LAB — CBC
HEMATOCRIT: 27.4 % — AB (ref 36.0–46.0)
HEMOGLOBIN: 9.3 g/dL — AB (ref 12.0–15.0)
MCH: 29.2 pg (ref 26.0–34.0)
MCHC: 33.9 g/dL (ref 30.0–36.0)
MCV: 85.9 fL (ref 78.0–100.0)
Platelets: 197 10*3/uL (ref 150–400)
RBC: 3.19 MIL/uL — AB (ref 3.87–5.11)
RDW: 13.8 % (ref 11.5–15.5)
WBC: 8 10*3/uL (ref 4.0–10.5)

## 2014-08-10 LAB — CLOTEST (H. PYLORI), BIOPSY: Helicobacter screen: NEGATIVE

## 2014-08-10 NOTE — Telephone Encounter (Signed)
Transition Care Management Follow-up Telephone Call D/C 08/10/14  How have you been since you were released from the hospital? Pt states she is feeling ok. Still tired   Do you understand why you were in the hospital? YES   Do you understand the discharge instrcutions? YES  Items Reviewed:  Medications reviewed: YES  Allergies reviewed: YES  Dietary changes reviewed: YES, heart healthy, carb modified, and avoid spicy  foods Referrals reviewed: No referrals  Functional Questionnaire:   Activities of Daily Living (ADLs):   She states she are independent in the following: ambulation, bathing and hygiene, feeding, continence, grooming, toileting and dressing She states she doesn't require assistance    Any transportation issues/concerns?: NO   Any patient concerns? NO   Confirmed importance and date/time of follow-up visits scheduled: YES, made appt for 08/24/14 w/ Dr. Doug Sou   Confirmed with patient if condition begins to worsen call PCP or go to the ER.  YES

## 2014-08-10 NOTE — Progress Notes (Signed)
Brooke [name] to be D/C'd Home per MD order.  Discussed with the patient and all questions fully answered.    Medication List    STOP taking these medications        cloNIDine 0.1 MG tablet  Commonly known as:  CATAPRES     glimepiride 2 MG tablet  Commonly known as:  AMARYL      TAKE these medications        albuterol 108 (90 BASE) MCG/ACT inhaler  Commonly known as:  PROAIR HFA  inhale 2 puffs by mouth every 4 hours if needed     ALPRAZolam 0.5 MG tablet  Commonly known as:  XANAX  Take 0.25 mg by mouth daily as needed for anxiety.     aspirin 81 MG tablet  Take 81 mg by mouth daily.     CRESTOR 20 MG tablet  Generic drug:  rosuvastatin  Take 20 mg by mouth daily.     FLUoxetine 10 MG tablet  Commonly known as:  PROZAC  Take 1 tablet (10 mg total) by mouth daily.     multivitamin-iron-minerals-folic acid chewable tablet  Chew 1 tablet by mouth daily.     olmesartan-hydrochlorothiazide 40-25 MG per tablet  Commonly known as:  BENICAR HCT  Take 1 tablet by mouth daily.     pantoprazole 40 MG tablet  Commonly known as:  PROTONIX  Take 1 tablet (40 mg total) by mouth daily before breakfast.     Saxagliptin-Metformin 10-998 MG Tb24  Commonly known as:  KOMBIGLYZE XR  Take 1 tablet by mouth daily.        VVS, Skin clean, dry and intact without evidence of skin break down, no evidence of skin tears noted. IV catheter discontinued intact. Site without signs and symptoms of complications. Dressing and pressure applied.  An After Visit Summary was printed and given to the patient.  D/c education completed with patient/family including follow up instructions, medication list, d/c activities limitations if indicated, with other d/c instructions as indicated by MD - patient able to verbalize understanding, all questions fully answered.   Patient instructed to return to ED, call 911, or call MD for any changes in condition.   Patient escorted via [place], and D/C home  via private auto.  [name] [facility] 08/10/2014 1:21 PM

## 2014-08-12 LAB — TYPE AND SCREEN
ABO/RH(D): B POS
Antibody Screen: NEGATIVE
UNIT DIVISION: 0
UNIT DIVISION: 0
UNIT DIVISION: 0
Unit division: 0
Unit division: 0
Unit division: 0

## 2014-08-13 NOTE — Progress Notes (Signed)
Quick Note:  Result sent primary GI Dr. Collene Mares ______

## 2014-08-24 ENCOUNTER — Other Ambulatory Visit (INDEPENDENT_AMBULATORY_CARE_PROVIDER_SITE_OTHER): Payer: Federal, State, Local not specified - PPO

## 2014-08-24 ENCOUNTER — Encounter: Payer: Self-pay | Admitting: Internal Medicine

## 2014-08-24 ENCOUNTER — Ambulatory Visit (INDEPENDENT_AMBULATORY_CARE_PROVIDER_SITE_OTHER): Payer: Federal, State, Local not specified - PPO | Admitting: Internal Medicine

## 2014-08-24 VITALS — BP 156/84 | HR 101 | Temp 97.7°F | Resp 16 | Wt 134.0 lb

## 2014-08-24 DIAGNOSIS — D62 Acute posthemorrhagic anemia: Secondary | ICD-10-CM

## 2014-08-24 DIAGNOSIS — D649 Anemia, unspecified: Secondary | ICD-10-CM

## 2014-08-24 DIAGNOSIS — R55 Syncope and collapse: Secondary | ICD-10-CM

## 2014-08-24 DIAGNOSIS — I10 Essential (primary) hypertension: Secondary | ICD-10-CM

## 2014-08-24 LAB — CBC WITH DIFFERENTIAL/PLATELET
BASOS ABS: 0.1 10*3/uL (ref 0.0–0.1)
Basophils Relative: 0.7 % (ref 0.0–3.0)
Eosinophils Absolute: 0.1 10*3/uL (ref 0.0–0.7)
Eosinophils Relative: 1.5 % (ref 0.0–5.0)
HEMATOCRIT: 35.7 % — AB (ref 36.0–46.0)
Hemoglobin: 12.1 g/dL (ref 12.0–15.0)
Lymphocytes Relative: 22 % (ref 12.0–46.0)
Lymphs Abs: 1.7 10*3/uL (ref 0.7–4.0)
MCHC: 33.9 g/dL (ref 30.0–36.0)
MCV: 86.7 fl (ref 78.0–100.0)
MONO ABS: 0.4 10*3/uL (ref 0.1–1.0)
Monocytes Relative: 5 % (ref 3.0–12.0)
NEUTROS ABS: 5.6 10*3/uL (ref 1.4–7.7)
Neutrophils Relative %: 70.8 % (ref 43.0–77.0)
PLATELETS: 471 10*3/uL — AB (ref 150.0–400.0)
RBC: 4.12 Mil/uL (ref 3.87–5.11)
RDW: 14.3 % (ref 11.5–15.5)
WBC: 7.9 10*3/uL (ref 4.0–10.5)

## 2014-08-24 LAB — BASIC METABOLIC PANEL
BUN: 15 mg/dL (ref 6–23)
CO2: 31 mEq/L (ref 19–32)
Calcium: 9.7 mg/dL (ref 8.4–10.5)
Chloride: 97 mEq/L (ref 96–112)
Creatinine, Ser: 1.03 mg/dL (ref 0.40–1.20)
GFR: 67.93 mL/min (ref >60.00)
GLUCOSE: 154 mg/dL — AB (ref 70–99)
Potassium: 4.1 mEq/L (ref 3.5–5.1)
SODIUM: 135 meq/L (ref 135–145)

## 2014-08-24 MED ORDER — FERROUS SULFATE 325 (65 FE) MG PO TBEC: 325.0000 mg | DELAYED_RELEASE_TABLET | Freq: Two times a day (BID) | ORAL | Status: DC | PRN

## 2014-08-24 NOTE — Assessment & Plan Note (Addendum)
Still mildly symptomatic. Will recheck CBC today, no recurrent signs of bleeding since leaving the hospital and she is taking her PPI as advised. She does follow up with GI on Thursday (will forward note and results to them). Rx for iron pills and advised stool softener while taking the iron.

## 2014-08-24 NOTE — Progress Notes (Signed)
Pre visit review using our clinic review tool, if applicable. No additional management support is needed unless otherwise documented below in the visit note. 

## 2014-08-24 NOTE — Patient Instructions (Signed)
We will recheck the blood levels today. We would like you to start taking iron 2 times per day for the next month to help your blood levels recover. It can cause constipation and it may be good for you to start a stool softener like dulcolax or docusate to help prevent constipation. I have sent in a prescription but it looks like your insurance will not cover it so if it is cheaper over the counter that is okay as well.   We will call you back about the lab results. If you have any more problems with bleeding or blood in the stools or dark stools call the office.   Iron-Rich Diet An iron-rich diet contains foods that are good sources of iron. Iron is an important mineral that helps your body produce hemoglobin. Hemoglobin is a protein in red blood cells that carries oxygen to the body's tissues. Sometimes, the iron level in your blood can be low. This may be caused by:  A lack of iron in your diet.  Blood loss.  Times of growth, such as during pregnancy or during a child's growth and development. Low levels of iron can cause a decrease in the number of red blood cells. This can result in iron deficiency anemia. Iron deficiency anemia symptoms include:  Tiredness.  Weakness.  Irritability.  Increased chance of infection. Here are some recommendations for daily iron intake:  Males older than 71 years of age need 8 mg of iron per day.  Women ages 77 to 36 need 18 mg of iron per day.  Pregnant women need 27 mg of iron per day, and women who are over 76 years of age and breastfeeding need 9 mg of iron per day.  Women over the age of 71 need 8 mg of iron per day. SOURCES OF IRON There are 2 types of iron that are found in food: heme iron and nonheme iron. Heme iron is absorbed by the body better than nonheme iron. Heme iron is found in meat, poultry, and fish. Nonheme iron is found in grains, beans, and vegetables. Heme Iron Sources Food / Iron (mg)  Chicken liver, 3 oz (85 g)/ 10  mg  Beef liver, 3 oz (85 g)/ 5.5 mg  Oysters, 3 oz (85 g)/ 8 mg  Beef, 3 oz (85 g)/ 2 to 3 mg  Shrimp, 3 oz (85 g)/ 2.8 mg  [place], 3 oz (85 g)/ 2 mg  Chicken, 3 oz (85 g) / 1 mg  Fish (tuna, halibut), 3 oz (85 g)/ 1 mg  Pork, 3 oz (85 g)/ 0.9 mg Nonheme Iron Sources Food / Iron (mg)  Ready-to-eat breakfast cereal, iron-fortified / 3.9 to 7 mg  Tofu,  cup / 3.4 mg  Kidney beans,  cup / 2.6 mg  Baked potato with skin / 2.7 mg  Asparagus,  cup / 2.2 mg  Avocado / 2 mg  Dried peaches,  cup / 1.6 mg  Raisins,  cup / 1.5 mg  Soy milk, 1 cup / 1.5 mg  Whole-wheat bread, 1 slice / 1.2 mg  Spinach, 1 cup / 0.8 mg  Broccoli,  cup / 0.6 mg IRON ABSORPTION Certain foods can decrease the body's absorption of iron. Try to avoid these foods and beverages while eating meals with iron-containing foods:  Coffee.  Tea.  Fiber.  Soy. Foods containing vitamin C can help increase the amount of iron your body absorbs from iron sources, especially from nonheme sources. Eat foods with vitamin C  along with iron-containing foods to increase your iron absorption. Foods that are high in vitamin C include many fruits and vegetables. Some good sources are:  Fresh orange juice.  Oranges.  Strawberries.  Mangoes.  Grapefruit.  Red bell peppers.  Green bell peppers.  Broccoli.  Potatoes with skin.  Tomato juice. Document Released: 01/17/2005 Document Revised: 08/28/2011 Document Reviewed: 11/24/2010 [facility] Patient Information 2015 [place], [place]. This information is not intended to replace advice given to you by your health care provider. Make sure you discuss any questions you have with your health care provider.

## 2014-08-24 NOTE — Progress Notes (Signed)
   Subjective:    Patient ID: Brooke [name], female    DOB: [birth year], 69 y.o.   MRN: [id number]  HPI The patient is a 69 YO female who is here for hospital follow up (in the hospital with syncope from symptomatic anemia from upper GI bleed s/p EGD/colonoscopy in the hospital). She is still having some lightheadedness but has not had recurrent syncope. She denies SOB. She is tired. She denies any dark stools or blood since leaving the hospital. She has been taking her medication (stopped clonidine as advised) as advised without problems. No new problems since leaving the hospital and denies falls. Denies abdominal pain, diarrhea, constipation.   Review of Systems  Constitutional: Positive for fatigue. Negative for fever, activity change, appetite change and unexpected weight change.  HENT: Negative.   Respiratory: Negative for cough, chest tightness, shortness of breath and wheezing.   Cardiovascular: Negative for chest pain, palpitations and leg swelling.  Gastrointestinal: Negative for nausea, abdominal pain, diarrhea, constipation, blood in stool, abdominal distention and anal bleeding.  Musculoskeletal: Negative for myalgias and arthralgias.  Skin: Negative.   Neurological: Positive for light-headedness. Negative for dizziness, seizures, syncope, weakness, numbness and headaches.  Psychiatric/Behavioral: Negative for confusion.      Objective:   Physical Exam  Constitutional: She is oriented to person, place, and time. She appears well-developed and well-nourished.  HENT:  Head: Normocephalic and atraumatic.  Eyes: EOM are normal.  Neck: Normal range of motion.  Cardiovascular: Normal rate and regular rhythm.   Carotids without bruits  Pulmonary/Chest: Effort normal and breath sounds normal. No respiratory distress. She has no wheezes. She has no rales.  Abdominal: Soft. Bowel sounds are normal. She exhibits no distension. There is no tenderness. There is no rebound.    Musculoskeletal: She exhibits no edema.  Neurological: She is alert and oriented to person, place, and time. Coordination normal.  Skin: Skin is warm and dry.   Filed Vitals:   08/24/14 0906  BP: 156/84  Pulse: 101  Temp: 97.7 F (36.5 C)  TempSrc: Oral  Resp: 16  Weight: 134 lb (60.782 kg)  SpO2: 97%      Assessment & Plan:

## 2014-08-24 NOTE — Assessment & Plan Note (Signed)
Likely due to the anemia. Not recurrent since leaving the hospital.

## 2014-08-24 NOTE — Assessment & Plan Note (Signed)
BP mildly elevated today off her clonidine. Will keep her off as she is still slightly symptomatic from the recent GI bleed and adjust at appointment in May if needed.

## 2014-08-27 ENCOUNTER — Ambulatory Visit: Payer: Federal, State, Local not specified - PPO | Admitting: Internal Medicine

## 2014-09-14 ENCOUNTER — Telehealth: Payer: Self-pay | Admitting: Internal Medicine

## 2014-09-14 MED ORDER — ATORVASTATIN CALCIUM 40 MG PO TABS: 40.0000 mg | ORAL_TABLET | Freq: Every day | ORAL | Status: DC

## 2014-09-14 NOTE — Telephone Encounter (Signed)
Patient requests that we do not call in CRESTOR 20 MG tablet [[id number]] because there is no generic. She requests the generic of lipitor to be called in its place to [facility] [address].   Also, patient states she needs a new BP med. She states that she took the benecar, and stopped it 10 days ago. All light headed and dizziness have subsided. She requests an alternative to be called in.

## 2014-09-14 NOTE — Telephone Encounter (Signed)
Can call in the lipitor for her. Okay to stop the benicar for a couple weeks while her body is recovering from the bleeding. We would like her to come in for nurse visit for BP check off the medicine end of this week or beginning of next  week. We may be able to resume the benicar in a few weeks.

## 2014-09-14 NOTE — Telephone Encounter (Signed)
Notified pt with md response. Made appt for nurse visit 3/30.Marland KitchenJohny Chess

## 2014-09-16 ENCOUNTER — Encounter: Payer: Self-pay | Admitting: *Deleted

## 2014-09-16 ENCOUNTER — Other Ambulatory Visit: Payer: Self-pay | Admitting: *Deleted

## 2014-09-16 ENCOUNTER — Ambulatory Visit (INDEPENDENT_AMBULATORY_CARE_PROVIDER_SITE_OTHER): Payer: Federal, State, Local not specified - PPO | Admitting: *Deleted

## 2014-09-16 VITALS — BP 162/92

## 2014-09-16 DIAGNOSIS — I10 Essential (primary) hypertension: Secondary | ICD-10-CM | POA: Diagnosis not present

## 2014-09-16 MED ORDER — SAXAGLIPTIN-METFORMIN ER 5-1000 MG PO TB24: 1.0000 | ORAL_TABLET | Freq: Every day | ORAL | Status: DC

## 2014-09-16 NOTE — Telephone Encounter (Signed)
Pt came in for BP check she stated her pharmacy received all medications except for her Komiglyze. Wanting rx resent. Inform pt will send to [facility]...Johny Chess

## 2014-09-21 ENCOUNTER — Ambulatory Visit (INDEPENDENT_AMBULATORY_CARE_PROVIDER_SITE_OTHER): Payer: Federal, State, Local not specified - PPO | Admitting: Internal Medicine

## 2014-09-21 ENCOUNTER — Encounter: Payer: Self-pay | Admitting: Internal Medicine

## 2014-09-21 VITALS — BP 132/88 | HR 77 | Temp 98.0°F | Resp 14 | Ht 63.0 in | Wt 138.6 lb

## 2014-09-21 DIAGNOSIS — I10 Essential (primary) hypertension: Secondary | ICD-10-CM | POA: Diagnosis not present

## 2014-09-21 MED ORDER — LOSARTAN POTASSIUM 50 MG PO TABS: 50.0000 mg | ORAL_TABLET | Freq: Every day | ORAL | Status: DC

## 2014-09-21 NOTE — Progress Notes (Signed)
Pre visit review using our clinic review tool, if applicable. No additional management support is needed unless otherwise documented below in the visit note. 

## 2014-09-21 NOTE — Progress Notes (Signed)
   Subjective:    Patient ID: Brooke [name], female    DOB: 1945-11-21, 70 y.o.   MRN: [id number]  HPI The patient is a 69 YO female who is coming back for a high blood pressure at home. She then had it checked at our clinic and it was high as well (160s/90s). She had stopped taking her benicar 3 weeks ago because she thought it was making her dizzy. She has felt well since that time and denies headaches, chest pains, nausea, dizziness. She has been having some high blood pressures off and on. She is not currently taking anything for her pressure.   Review of Systems  Constitutional: Negative for fever, activity change, appetite change and unexpected weight change.  HENT: Negative.   Respiratory: Negative for cough, chest tightness, shortness of breath and wheezing.   Cardiovascular: Negative for chest pain, palpitations and leg swelling.  Gastrointestinal: Negative for nausea, abdominal pain, diarrhea, constipation, blood in stool, abdominal distention and anal bleeding.  Musculoskeletal: Negative for myalgias and arthralgias.  Skin: Negative.   Neurological: Negative for dizziness, seizures, syncope, weakness, numbness and headaches.  Psychiatric/Behavioral: Negative for confusion.      Objective:   Physical Exam  Constitutional: She is oriented to person, place, and time. She appears well-developed and well-nourished.  HENT:  Head: Normocephalic and atraumatic.  Eyes: EOM are normal.  Cardiovascular: Normal rate and regular rhythm.   Pulmonary/Chest: Effort normal and breath sounds normal. No respiratory distress.  Abdominal: Soft.  Neurological: She is alert and oriented to person, place, and time. Coordination normal.   Filed Vitals:   09/21/14 0823  BP: 132/88  Pulse: 77  Temp: 98 F (36.7 C)  TempSrc: Oral  Resp: 14  Height: 5\' 3"  (1.6 m)  Weight: 138 lb 9.6 oz (62.869 kg)  SpO2: 95%      Assessment & Plan:

## 2014-09-21 NOTE — Assessment & Plan Note (Signed)
Moderate exacerbation and off blood pressure medicine. Okay with her stopping benicar but do want her on ACE-I for her diabetes as well as blood pressure. Start lisinopril 20 mg daily and see her back 1-2 months for recheck and labs (for the new lisinopril). BP normal today but high lately off medicine.

## 2014-09-21 NOTE — Patient Instructions (Signed)
We will have you stay off the benicar for good. We have sent in losartan which is a blood pressure medicine you take once a day. It works for blood pressure and also helps to protect the kidneys from any high pressures or high sugars.   Come back in about 1-2 months (you can move back the May appointment if you want).

## 2014-10-26 ENCOUNTER — Ambulatory Visit: Payer: Federal, State, Local not specified - PPO | Admitting: Internal Medicine

## 2014-10-27 ENCOUNTER — Emergency Department (HOSPITAL_COMMUNITY)
Admission: EM | Admit: 2014-10-27 | Discharge: 2014-10-27 | Disposition: A | Discharge status: Home / Self Care | Payer: Federal, State, Local not specified - PPO | Source: Home / Self Care | Attending: Family Medicine | Admitting: Family Medicine

## 2014-10-27 ENCOUNTER — Encounter (HOSPITAL_COMMUNITY): Payer: Self-pay | Admitting: *Deleted

## 2014-10-27 ENCOUNTER — Ambulatory Visit (INDEPENDENT_AMBULATORY_CARE_PROVIDER_SITE_OTHER): Payer: Federal, State, Local not specified - PPO | Admitting: Internal Medicine

## 2014-10-27 ENCOUNTER — Encounter: Payer: Self-pay | Admitting: Internal Medicine

## 2014-10-27 ENCOUNTER — Encounter (INDEPENDENT_AMBULATORY_CARE_PROVIDER_SITE_OTHER): Payer: Self-pay

## 2014-10-27 VITALS — BP 112/72 | HR 71 | Ht 63.0 in | Wt 137.2 lb

## 2014-10-27 DIAGNOSIS — I82409 Acute embolism and thrombosis of unspecified deep veins of unspecified lower extremity: Secondary | ICD-10-CM | POA: Diagnosis not present

## 2014-10-27 DIAGNOSIS — D869 Sarcoidosis, unspecified: Secondary | ICD-10-CM

## 2014-10-27 DIAGNOSIS — S81811A Laceration without foreign body, right lower leg, initial encounter: Secondary | ICD-10-CM | POA: Diagnosis not present

## 2014-10-27 MED ORDER — BUPIVACAINE HCL (PF) 0.5 % IJ SOLN
INTRAMUSCULAR | Status: AC
  Filled 2014-10-27: qty 10

## 2014-10-27 NOTE — Patient Instructions (Signed)
Please call if you need [place]. Hope this is a really great year.

## 2014-10-27 NOTE — Progress Notes (Signed)
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 Hx pleural effusion, Hx DVT, Hx allergic rhinitis FOLLOWS FOR: pt reports having SOB from time to time-- denies any other concerns She thought it was time to come back for reassessment. Stable dyspnea on exertion carrying, pushing, climbing stairs. Denies night sweats, adenopathy, fever, chest pain or palpitation, calf pain or swelling. No cough. Was treated for chronic urticaria with prednisone and then maintenance therapy with Zyrtec during the day and Vistaril at night. She is now just using Zyrtec with no hives in months. She had not been asked to stop lisinopril which we discussed. CXR 11/12/07 IMPRESSION:  1. Mild increase in pleural effusions and associated bibasilar air  space opacities.  2. Stable biapical subpleural scarring.  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 Hx pleural effusion, Hx DVT, Hx allergic rhinitis FOLLOWS FOR: States she has good days and bad days. C/o SOB with mild-moderate activity and chest tightness with weather change  and when SOB. When pt gets hot flashes it "cuts off the oxygen" and is unable to breathe smoothly. Denies CP.  CXR 08/26/12 IMPRESSION:  Bibasilar densities, improved since prior study. Residual  opacities likely reflect scarring.  Decreasing small bilateral effusions. Residual small effusions  versus pleural thickening.  Original Report Authenticated By: Rolm Baptise, M.D.  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 Hx pleural effusion, Hx DVT, Hx allergic rhinitis FOLLOWS FOR: "no big changes in breathing" per patient.  Diagnosed with anemia this winter and given 2  units of packed red cells. No endoscopic cause. Now on iron. Hemoglobin up to 12.1 as of last check and she feels stronger. No acute respiratory events reviewed she denies routine cough, phlegm, night sweats. CXR 08/26/13 IMPRESSION Chronic changes without acute abnormality SIGNATURE Electronically Signed  By: Inez Catalina M.D.  On: 08/26/2013 16:11  ROS-see HPI Constitutional:   No-   weight loss, +night sweats, fevers, chills, fatigue, lassitude. HEENT:   No-  headaches, difficulty swallowing, tooth/dental problems, sore throat,       No-  sneezing, itching, ear ache, nasal congestion, post nasal drip,  CV:  No-   chest pain, orthopnea, PND, swelling in lower extremities, anasarca,                                                           dizziness, palpitations Resp: +shortness of breath with exertion or at rest.              No-   productive cough,  No non-productive cough,  No- coughing up of blood.              No-   change in color of mucus.  No- wheezing.   Skin: No-   rash or lesions. GI:  No-   heartburn, indigestion, abdominal pain, nausea, vomiting,  GU: . MS:  No-   joint pain or swelling.  . Neuro-     nothing unusual Psych:  No- change in mood or affect. No depression or anxiety.  No memory loss.  OBJ- Physical Exam  General- Alert, Oriented, Affect-appropriate, Distress- none acute. +Looks well. Skin- rash-none, lesions- none, excoriation- none Lymphadenopathy- none Head- atraumatic            Eyes- Gross vision intact, PERRLA, conjunctivae and secretions clear            Ears- Hearing, canals-normal            Nose- Clear, no-Septal dev, mucus, polyps, erosion, perforation             Throat- Mallampati II , mucosa clear , drainage- none, tonsils- atrophic Neck- flexible , trachea midline, no stridor , thyroid nl, carotid no bruit Chest - symmetrical excursion , unlabored           Heart/CV- RRR , no murmur , no gallop  , no rub, nl s1 s2                            - JVD- none , edema- none, stasis changes- none, varices- none           Lung- , + decreased in bases, wheeze- none, cough- none ,                      dullness-none, rub- none           Chest wall-  Abd-  Br/ Gen/ Rectal- Not done, not indicated Extrem- cyanosis- none, clubbing, none, atrophy- none, strength- nl Neuro- grossly intact to observation

## 2014-10-27 NOTE — Assessment & Plan Note (Signed)
She has had no recurrence but is reminded to avoid stasis especially on long trips.

## 2014-10-27 NOTE — ED Provider Notes (Addendum)
CSN: [id number]     Arrival date & time 10/27/14  1601 History   First MD Initiated Contact with Patient 10/27/14 1646     Chief Complaint  Patient presents with  . Extremity Laceration   (Consider location/radiation/quality/duration/timing/severity/associated sxs/prior Treatment) Patient is a 69 y.o. female presenting with skin laceration. The history is provided by the patient and a relative.  Laceration Location:  Leg Leg laceration location:  R lower leg Length (cm):  3 Depth:  Cutaneous Quality: straight   Bleeding: controlled   Time since incident:  2 hours Laceration mechanism:  Unable to specify (cleaning out a house and unknown sharp object cut right lower leg.) Pain details:    Quality:  Sharp   Progression:  Unchanged Foreign body present:  No foreign bodies Tetanus status:  Up to date   Past Medical History  Diagnosis Date  . Sarcoidosis   . 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 Peripheral neuropathy     uncertain relation to lung disease  . DVT (deep venous thrombosis)   . Pleural effusion   . Allergic rhinitis   . Diabetes mellitus without complication   . Hypertension   . Erosive gastritis with hemorrhage 08/09/2014   Past Surgical History  Procedure Laterality Date  . Cholecystectomy  1975  . Foot surgery  2013    left  . Colonoscopy    . Esophagogastroduodenoscopy    . Esophagogastroduodenoscopy N/A 08/09/2014    Procedure: ESOPHAGOGASTRODUODENOSCOPY (EGD);  Surgeon: Gatha Mayer, MD;  Location: [facility] ENDOSCOPY;  Service: Endoscopy;  Laterality: N/A;   Family History  Problem Relation Age of Onset  . Hypertension Mother   . Colon cancer Neg Hx    History  Substance Use Topics  . Smoking status: Former Smoker -- 0.03 packs/day for 15 years    Types: Cigarettes    Quit date: 06/19/1982  . Smokeless tobacco: Never Used  . Alcohol Use: No   OB History    No data available     Review of Systems  Constitutional: Negative.     Musculoskeletal: Negative.   Skin: Positive for wound.    Allergies  Codeine  Home Medications   Prior to Admission medications   Medication Sig Start Date End Date Taking? Authorizing Provider  albuterol (PROAIR HFA) 108 (90 BASE) MCG/ACT inhaler inhale 2 puffs by mouth every 4 hours if needed Patient taking differently: Inhale 2 puffs into the lungs every 4 (four) hours as needed for wheezing or shortness of breath.  01/14/14   Deneise Lever, MD  ALPRAZolam Duanne Moron) 0.5 MG tablet Take 0.25 mg by mouth daily as needed for anxiety.     Historical Provider, MD  aspirin 81 MG tablet Take 81 mg by mouth daily.    Historical Provider, MD  atorvastatin (LIPITOR) 40 MG tablet Take 1 tablet (40 mg total) by mouth daily. 09/14/14   Olga Millers, MD  ferrous sulfate 325 (65 FE) MG EC tablet Take 1 tablet (325 mg total) by mouth 2 (two) times daily as needed. 08/24/14   Olga Millers, MD  FLUoxetine (PROZAC) 10 MG tablet Take 1 tablet (10 mg total) by mouth daily. 07/13/14   Olga Millers, MD  losartan (COZAAR) 50 MG tablet Take 1 tablet (50 mg total) by mouth daily. 09/21/14   Olga Millers, MD  pantoprazole (PROTONIX) 40 MG tablet Take 1 tablet (40 mg total) by mouth daily before breakfast. 08/10/14   Florencia Reasons, MD  Saxagliptin-Metformin [facility]  XR) 10-998 MG TB24 Take 1 tablet by mouth daily. 09/16/14   Olga Millers, MD   BP 167/85 mmHg  Pulse 90  Temp(Src) 98.9 F (37.2 C) (Oral)  Resp 16  SpO2 100% Physical Exam  Constitutional: She is oriented to person, place, and time. She appears well-developed and well-nourished. No distress.  Musculoskeletal: Normal range of motion. She exhibits tenderness.       Feet:  Neurological: She is alert and oriented to person, place, and time.  Skin: Skin is warm.  Nursing note and vitals reviewed.   ED Course  LACERATION REPAIR Date/Time: 10/27/2014 5:23 PM Performed by: Billy Fischer Authorized by: Ihor Gully D Consent:  Verbal consent obtained. Risks and benefits: risks, benefits and alternatives were discussed Consent given by: patient Body area: lower extremity Location details: right lower leg Laceration length: 3.5 cm Tendon involvement: none Nerve involvement: none Vascular damage: no Anesthesia: local infiltration Local anesthetic: bupivacaine 0.5% without epinephrine Patient sedated: no Preparation: Patient was prepped and draped in the usual sterile fashion. Irrigation solution: saline Irrigation method: syringe Amount of cleaning: standard Debridement: none Degree of undermining: none Skin closure: staples Number of sutures: 7 Technique: simple Approximation: close Approximation difficulty: simple Dressing: gauze roll and non-adhesive packing strip Patient tolerance: Patient tolerated the procedure well with no immediate complications   (including critical care time) Labs Review Labs Reviewed - No data to display  Imaging Review No results found.   MDM   1. Laceration of lower leg, right, initial encounter        Billy Fischer, MD 10/30/14 0355  Billy Fischer, MD 10/30/14 2148

## 2014-10-27 NOTE — Assessment & Plan Note (Signed)
She has had no reactivation of the lower lung zone inflammatory process with granuloma formation which was interpreted as atypical, seronegative sarcoid. She is clinically stable and a nonsmoker, so we have chosen to skip chest x-ray this year.

## 2014-10-27 NOTE — ED Notes (Signed)
Pt  Reports   Laceration    Of  Her  r  Lower  Leg   Today         On a  Sharp  Object  That  Was  In a  Garbage bag   When  She  Was moving    Furniture     bleeding  Has  Subsided  No  Loss  Of  concoussness       She  Is  Awake  As  Well  As  Alert  And  Oriented

## 2014-11-10 ENCOUNTER — Emergency Department (HOSPITAL_COMMUNITY)
Admission: EM | Admit: 2014-11-10 | Discharge: 2014-11-10 | Disposition: A | Discharge status: Home / Self Care | Payer: Federal, State, Local not specified - PPO | Source: Home / Self Care

## 2014-11-11 ENCOUNTER — Emergency Department (INDEPENDENT_AMBULATORY_CARE_PROVIDER_SITE_OTHER)
Admission: EM | Admit: 2014-11-11 | Discharge: 2014-11-11 | Disposition: A | Discharge status: Home / Self Care | Payer: Federal, State, Local not specified - PPO | Source: Home / Self Care | Attending: Family Medicine | Admitting: Family Medicine

## 2014-11-11 ENCOUNTER — Encounter (HOSPITAL_COMMUNITY): Payer: Self-pay | Admitting: Emergency Medicine

## 2014-11-11 DIAGNOSIS — Z4802 Encounter for removal of sutures: Secondary | ICD-10-CM

## 2014-11-11 DIAGNOSIS — L03115 Cellulitis of right lower limb: Secondary | ICD-10-CM

## 2014-11-11 MED ORDER — CEPHALEXIN 500 MG PO TABS: 500.0000 mg | ORAL_TABLET | Freq: Three times a day (TID) | ORAL | Status: DC

## 2014-11-11 MED ORDER — FLUCONAZOLE 150 MG PO TABS: 150.0000 mg | ORAL_TABLET | Freq: Every day | ORAL | Status: DC

## 2014-11-11 NOTE — Discharge Instructions (Signed)
Your staples were removed successfully. There is a mild infection present around your wound. Please start the antibiotics and taken for the full 7 days. Please use the Diflucan if you develop a yeast infection. If the wound opens please clean the area daily and apply a new bandage daily. This will take several days to weeks to heal. After 6 weeks please use vitamin E ointment to help soften the scar.

## 2014-11-11 NOTE — ED Provider Notes (Signed)
CSN: [id number]     Arrival date & time 11/11/14  0807 History   First MD Initiated Contact with Patient 11/11/14 [phone]     Chief Complaint  Patient presents with  . Suture / Staple Removal   (Consider location/radiation/quality/duration/timing/severity/associated sxs/prior Treatment) HPI Staples placed 14 days ago for right lateral laceration of the lower extremity. One staple has fallen out. Patient paces antibody ointment every other day on the wound. Started becoming tender and red over the last several days. No purulent discharge. No fevers, chest pain, short of breath, palpitations, rash, joint effusion. Problem is constant and getting worse.   Past Medical History  Diagnosis Date  . Sarcoidosis   . 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 Peripheral neuropathy     uncertain relation to lung disease  . DVT (deep venous thrombosis)   . Pleural effusion   . Allergic rhinitis   . Diabetes mellitus without complication   . Hypertension   . Erosive gastritis with hemorrhage 08/09/2014   Past Surgical History  Procedure Laterality Date  . Cholecystectomy  1975  . Foot surgery  2013    left  . Colonoscopy    . Esophagogastroduodenoscopy    . Esophagogastroduodenoscopy N/A 08/09/2014    Procedure: ESOPHAGOGASTRODUODENOSCOPY (EGD);  Surgeon: Gatha Mayer, MD;  Location: [facility] ENDOSCOPY;  Service: Endoscopy;  Laterality: N/A;   Family History  Problem Relation Age of Onset  . Hypertension Mother   . Colon cancer Neg Hx    History  Substance Use Topics  . Smoking status: Former Smoker -- 0.03 packs/day for 15 years    Types: Cigarettes    Quit date: 06/19/1982  . Smokeless tobacco: Never Used  . Alcohol Use: No   OB History    No data available     Review of Systems Per HPI with all other pertinent systems negative.   Allergies  Codeine  Home Medications   Prior to Admission medications   Medication Sig Start Date End Date Taking? Authorizing Provider  albuterol  (PROAIR HFA) 108 (90 BASE) MCG/ACT inhaler inhale 2 puffs by mouth every 4 hours if needed Patient taking differently: Inhale 2 puffs into the lungs every 4 (four) hours as needed for wheezing or shortness of breath.  01/14/14  Yes Deneise Lever, MD  ALPRAZolam Duanne Moron) 0.5 MG tablet Take 0.25 mg by mouth daily as needed for anxiety.    Yes Historical Provider, MD  aspirin 81 MG tablet Take 81 mg by mouth daily.   Yes Historical Provider, MD  atorvastatin (LIPITOR) 40 MG tablet Take 1 tablet (40 mg total) by mouth daily. 09/14/14  Yes Olga Millers, MD  ferrous sulfate 325 (65 FE) MG EC tablet Take 1 tablet (325 mg total) by mouth 2 (two) times daily as needed. 08/24/14  Yes Olga Millers, MD  FLUoxetine (PROZAC) 10 MG tablet Take 1 tablet (10 mg total) by mouth daily. 07/13/14  Yes Olga Millers, MD  losartan (COZAAR) 50 MG tablet Take 1 tablet (50 mg total) by mouth daily. 09/21/14  Yes Olga Millers, MD  pantoprazole (PROTONIX) 40 MG tablet Take 1 tablet (40 mg total) by mouth daily before breakfast. 08/10/14  Yes Florencia Reasons, MD  Saxagliptin-Metformin (KOMBIGLYZE XR) 10-998 MG TB24 Take 1 tablet by mouth daily. 09/16/14  Yes Olga Millers, MD  Cephalexin 500 MG tablet Take 1 tablet (500 mg total) by mouth 3 (three) times daily. 11/11/14   Waldemar Dickens, MD  fluconazole (  DIFLUCAN) 150 MG tablet Take 1 tablet (150 mg total) by mouth daily. Repeat dose in 3 days 11/11/14   Waldemar Dickens, MD   BP 160/82 mmHg  Pulse 78  Temp(Src) 98.2 F (36.8 C) (Oral)  Resp 16  SpO2 99% Physical Exam Physical Exam  Constitutional: oriented to person, place, and time. appears well-developed and well-nourished. No distress.  HENT:  Head: Normocephalic and atraumatic.  Eyes: EOMI. PERRL.  Neck: Normal range of motion.  Cardiovascular: RRR, no m/r/g, 2+ distal pulses,  Pulmonary/Chest: Effort normal and breath sounds normal. No respiratory distress.  Abdominal: Soft. Bowel sounds are normal.  NonTTP, no distension.  Musculoskeletal: Normal range of motion. Non ttp, no effusion.  Neurological: alert and oriented to person, place, and time.  Skin: Right lateral skin lesion just proximal to the ankle with 6 tables in place with surrounding erythema and induration. Tender to palpation. Minimal purulence from one of the holes created by the staples after removal.  Psychiatric: normal mood and affect. behavior is normal. Judgment and thought content normal.   ED Course  Procedures (including critical care time) Labs Review Labs Reviewed - No data to display  Imaging Review No results found.   MDM   1. Removal of staples   2. Cellulitis of right lower extremity    6 staples were removed using staple remover. This is done without couple location. Mild purulence noted from one of the staple sites. Surrounding erythema and induration concerning for secondary synovitis. I'm hopeful that the scar will not dehisce and the patient will be able to continue typical wound healing course. Will start antibiotics. Wound care instructions given for both routine wound healing as well as if wound dehiscence and will have to heal by secondary intention.   Waldemar Dickens, MD 11/11/14 [phone]

## 2014-11-11 NOTE — ED Notes (Signed)
Pt here to have her staples removed in her left foot/ankle.  Wound is clean, dry and intact with some redness around the wound, but no heat.  Pt complains of some soreness below the wound.

## 2014-11-24 ENCOUNTER — Encounter (INDEPENDENT_AMBULATORY_CARE_PROVIDER_SITE_OTHER): Payer: Self-pay

## 2014-11-24 ENCOUNTER — Encounter: Payer: Self-pay | Admitting: Internal Medicine

## 2014-11-24 ENCOUNTER — Other Ambulatory Visit (INDEPENDENT_AMBULATORY_CARE_PROVIDER_SITE_OTHER): Payer: Federal, State, Local not specified - PPO

## 2014-11-24 ENCOUNTER — Ambulatory Visit (INDEPENDENT_AMBULATORY_CARE_PROVIDER_SITE_OTHER): Payer: Federal, State, Local not specified - PPO | Admitting: Internal Medicine

## 2014-11-24 VITALS — BP 130/76 | HR 71 | Temp 97.9°F | Resp 16 | Ht 63.0 in | Wt 140.0 lb

## 2014-11-24 DIAGNOSIS — E119 Type 2 diabetes mellitus without complications: Secondary | ICD-10-CM

## 2014-11-24 DIAGNOSIS — I10 Essential (primary) hypertension: Secondary | ICD-10-CM

## 2014-11-24 LAB — BASIC METABOLIC PANEL
BUN: 14 mg/dL (ref 6–23)
CALCIUM: 9.3 mg/dL (ref 8.4–10.5)
CHLORIDE: 99 meq/L (ref 96–112)
CO2: 29 mEq/L (ref 19–32)
CREATININE: 0.8 mg/dL (ref 0.40–1.20)
GFR: 90.86 mL/min (ref >60.00)
GLUCOSE: 125 mg/dL — AB (ref 70–99)
POTASSIUM: 4.4 meq/L (ref 3.5–5.1)
Sodium: 134 mEq/L — ABNORMAL LOW (ref 135–145)

## 2014-11-24 LAB — HEMOGLOBIN A1C: HEMOGLOBIN A1C: 6.8 % — AB (ref 4.6–6.5)

## 2014-11-24 MED ORDER — LOSARTAN POTASSIUM 50 MG PO TABS: 50.0000 mg | ORAL_TABLET | Freq: Every day | ORAL | Status: DC

## 2014-11-24 NOTE — Progress Notes (Signed)
   Subjective:    Patient ID: Brooke [name], female    DOB: 1945-11-22, 69 y.o.   MRN: [id number]  HPI The patient is a 69 YO female who is coming in today to follow up on her diabetes and blood pressure. She is doing well on the losartan we started at last visit and her BP is going down at home. Denies headaches, chest pains. No side effects from the medicine. She is taking daily and has taken already today. The diabetes is also doing well although her diet is not ideal. She continues to take her medication and denies any hypoglycemia symptoms. She does not check her sugars at home. Has been exercising at home. No fevers or chills. No numbness or burning in her feet.   Review of Systems  Constitutional: Negative for fever, activity change, appetite change and unexpected weight change.  HENT: Negative.   Respiratory: Negative for cough, chest tightness, shortness of breath and wheezing.   Cardiovascular: Negative for chest pain, palpitations and leg swelling.  Gastrointestinal: Negative for nausea, abdominal pain, diarrhea, constipation, blood in stool, abdominal distention and anal bleeding.  Musculoskeletal: Negative for myalgias and arthralgias.  Skin: Negative.   Neurological: Negative for dizziness, seizures, syncope, weakness, numbness and headaches.  Psychiatric/Behavioral: Negative for confusion.      Objective:   Physical Exam  Constitutional: She is oriented to person, place, and time. She appears well-developed and well-nourished.  HENT:  Head: Normocephalic and atraumatic.  Eyes: EOM are normal.  Cardiovascular: Normal rate and regular rhythm.   Pulmonary/Chest: Effort normal and breath sounds normal. No respiratory distress.  Abdominal: Soft.  Neurological: She is alert and oriented to person, place, and time. Coordination normal.  Skin:  See foot exam   Filed Vitals:   11/24/14 0937  BP: 130/76  Pulse: 71  Temp: 97.9 F (36.6 C)  TempSrc: Oral  Resp: 16  Height:  5\' 3"  (1.6 m)  Weight: 140 lb (63.504 kg)  SpO2: 97%      Assessment & Plan:

## 2014-11-24 NOTE — Assessment & Plan Note (Signed)
She is doing well on losartan and BP at goal today. Checking BMP to ensure no changes in the kidneys or electrolytes. Adjust as needed.

## 2014-11-24 NOTE — Assessment & Plan Note (Signed)
Checking HgA1c today. Foot exam done. Reminded her that she is due for eye exam. She will continue glimipiride, metformin, saxagliptin. She is on ARB therapy and checking BMP today. She is on statin and ASA 81 mg daily. Change as appropriate based on labs.

## 2014-11-24 NOTE — Patient Instructions (Addendum)
We will check on the blood work today and call you back about the results.   The blood pressure is doing much better today which is good. The place on your leg appears to be healing well.   Come back in about 3-6 months and we will check on the diabetes again.   Diabetes and Exercise Exercising regularly is important. It is not just about losing weight. It has many health benefits, such as:  Improving your overall fitness, flexibility, and endurance.  Increasing your bone density.  Helping with weight control.  Decreasing your body fat.  Increasing your muscle strength.  Reducing stress and tension.  Improving your overall health. People with diabetes who exercise gain additional benefits because exercise:  Reduces appetite.  Improves the body's use of blood sugar (glucose).  Helps lower or control blood glucose.  Decreases blood pressure.  Helps control blood lipids (such as cholesterol and triglycerides).  Improves the body's use of the hormone insulin by:  Increasing the body's insulin sensitivity.  Reducing the body's insulin needs.  Decreases the risk for heart disease because exercising:  Lowers cholesterol and triglycerides levels.  Increases the levels of good cholesterol (such as high-density lipoproteins [HDL]) in the body.  Lowers blood glucose levels. YOUR ACTIVITY PLAN  Choose an activity that you enjoy and set realistic goals. Your health care provider or diabetes educator can help you make an activity plan that works for you. Exercise regularly as directed by your health care provider. This includes:  Performing resistance training twice a week such as push-ups, sit-ups, lifting weights, or using resistance bands.  Performing 150 minutes of cardio exercises each week such as walking, running, or playing sports.  Staying active and spending no more than 90 minutes at one time being inactive. Even short bursts of exercise are good for you. Three  10-minute sessions spread throughout the day are just as beneficial as a single 30-minute session. Some exercise ideas include:  Taking the dog for a walk.  Taking the stairs instead of the elevator.  Dancing to your favorite song.  Doing an exercise video.  Doing your favorite exercise with a friend. RECOMMENDATIONS FOR EXERCISING WITH TYPE 1 OR TYPE 2 DIABETES   Check your blood glucose before exercising. If blood glucose levels are greater than 240 mg/dL, check for urine ketones. Do not exercise if ketones are present.  Avoid injecting insulin into areas of the body that are going to be exercised. For example, avoid injecting insulin into:  The arms when playing tennis.  The legs when jogging.  Keep a record of:  Food intake before and after you exercise.  Expected peak times of insulin action.  Blood glucose levels before and after you exercise.  The type and amount of exercise you have done.  Review your records with your health care provider. Your health care provider will help you to develop guidelines for adjusting food intake and insulin amounts before and after exercising.  If you take insulin or oral hypoglycemic agents, watch for signs and symptoms of hypoglycemia. They include:  Dizziness.  Shaking.  Sweating.  Chills.  Confusion.  Drink plenty of water while you exercise to prevent dehydration or heat stroke. Body water is lost during exercise and must be replaced.  Talk to your health care provider before starting an exercise program to make sure it is safe for you. Remember, almost any type of activity is better than none. Document Released: 08/26/2003 Document Revised: 10/20/2013 Document Reviewed: 11/12/2012  [employer] Patient Information 2015 [employer]. This information is not intended to replace advice given to you by your health care provider. Make sure you discuss any questions you have with your health care provider.  

## 2014-11-24 NOTE — Progress Notes (Signed)
Pre visit review using our clinic review tool, if applicable. No additional management support is needed unless otherwise documented below in the visit note. 

## 2014-12-31 ENCOUNTER — Encounter: Payer: Self-pay | Admitting: Internal Medicine

## 2015-03-08 ENCOUNTER — Other Ambulatory Visit: Payer: Self-pay | Admitting: Internal Medicine

## 2015-03-13 ENCOUNTER — Other Ambulatory Visit: Payer: Self-pay | Admitting: Internal Medicine

## 2015-04-17 ENCOUNTER — Other Ambulatory Visit: Payer: Self-pay | Admitting: Internal Medicine

## 2015-06-08 ENCOUNTER — Encounter: Payer: Self-pay | Admitting: Internal Medicine

## 2015-06-08 ENCOUNTER — Ambulatory Visit (INDEPENDENT_AMBULATORY_CARE_PROVIDER_SITE_OTHER): Payer: Federal, State, Local not specified - PPO | Admitting: Internal Medicine

## 2015-06-08 VITALS — BP 134/84 | HR 70 | Temp 97.6°F | Resp 16 | Wt 150.0 lb

## 2015-06-08 DIAGNOSIS — M542 Cervicalgia: Secondary | ICD-10-CM

## 2015-06-08 NOTE — Progress Notes (Signed)
Pre visit review using our clinic review tool, if applicable. No additional management support is needed unless otherwise documented below in the visit note. 

## 2015-06-08 NOTE — Progress Notes (Signed)
Subjective:    Patient ID: Brooke [name], female    DOB: 1946-04-11, 69 y.o.   MRN: [id number]  HPI She is here for an acute visit for neck pain.    She is having pain from the back of the ear down the neck for a couple of weeks.  When she swallows she has pain on the left side of her neck as well and that started last week.  She has no pain inside her throat of in the anterior or right side of her neck.  She takes 2 aspirin every 6 hours, which helps, but then the pain returns.  The pain is getting worse.   Nothing makes the pain worse.  There is no change with moving her jaw or eating.   She denies any cold symptoms.   Medications and allergies reviewed with patient and updated if appropriate.  Patient Active Problem List   Diagnosis Date Noted  . GI bleed 08/09/2014  . Syncope and collapse 08/09/2014  . Erosive gastritis with hemorrhage 08/09/2014  . Symptomatic anemia   . Diabetes mellitus type 2, uncomplicated ([place]) [id number]  . Essential hypertension 07/13/2014  . Hyperlipidemia 07/13/2014  . SARCOIDOSIS, PULMONARY 07/18/2007  . 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 ALLERGIC RHINITIS 07/18/2007    Current Outpatient Prescriptions on File Prior to Visit  Medication Sig Dispense Refill  . ALPRAZolam (XANAX) 0.5 MG tablet Take 0.25 mg by mouth daily as needed for anxiety.     Marland Kitchen aspirin 81 MG tablet Take 81 mg by mouth daily.    Marland Kitchen atorvastatin (LIPITOR) 40 MG tablet Take 1 tablet (40 mg total) by mouth daily. 90 tablet 3  . ferrous sulfate 325 (65 FE) MG EC tablet Take 1 tablet (325 mg total) by mouth 2 (two) times daily as needed. (Patient taking differently: Take 325 mg by mouth daily. ) 60 tablet 3  . FLUoxetine (PROZAC) 10 MG tablet take 1 tablet by mouth once daily 30 tablet 6  . glimepiride (AMARYL) 2 MG tablet Take 2 mg by mouth daily with breakfast.   0  . losartan (COZAAR) 50 MG tablet Take 1 tablet (50 mg total) by  mouth daily. 90 tablet 3  . PROAIR HFA 108 (90 BASE) MCG/ACT inhaler inhale 2 puffs every 4 hours if needed 8.5 Inhaler 6  . Saxagliptin-Metformin (KOMBIGLYZE XR) 10-998 MG TB24 Take 1 tablet by mouth daily. 30 tablet 6   No current facility-administered medications on file prior to visit.    Past Medical History  Diagnosis Date  . Sarcoidosis   . 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 Peripheral neuropathy     uncertain relation to lung disease  . DVT (deep venous thrombosis)   . Pleural effusion   . Allergic rhinitis   . Diabetes mellitus without complication   . Hypertension   . Erosive gastritis with hemorrhage 08/09/2014    Past Surgical History  Procedure Laterality Date  . Cholecystectomy  1975  . Foot surgery  2013    left  . Colonoscopy    . Esophagogastroduodenoscopy    . Esophagogastroduodenoscopy N/A 08/09/2014    Procedure: ESOPHAGOGASTRODUODENOSCOPY (EGD);  Surgeon: Gatha Mayer, MD;  Location: [facility] ENDOSCOPY;  Service: Endoscopy;  Laterality: N/A;    Social History   Social History  . Marital Status: Single    Spouse Name: N/A  . Number of Children: N/A  . Years of Education: N/A  Social History Main Topics  . Smoking status: Former Smoker -- 0.03 packs/day for 15 years    Types: Cigarettes    Quit date: 06/19/1982  . Smokeless tobacco: Never Used  . Alcohol Use: No  . Drug Use: No  . Sexual Activity: Not on file   Other Topics Concern  . Not on file   Social History Narrative    Review of Systems  Constitutional: Positive for fatigue (mild, feels yucky). Negative for fever, chills and appetite change.  HENT: Positive for congestion (chronic, not worse thatn usual) and rhinorrhea. Negative for ear pain, sinus pressure, sore throat and trouble swallowing.   Respiratory: Negative for cough, shortness of breath and wheezing.   Cardiovascular: Negative for chest pain and palpitations.  Gastrointestinal: Negative for nausea, abdominal pain and  diarrhea.  Neurological: Negative for dizziness, light-headedness and headaches.       Objective:   Filed Vitals:   06/08/15 0838  BP: 134/84  Pulse: 70  Temp: 97.6 F (36.4 C)  Resp: 16   Filed Weights   06/08/15 0838  Weight: 150 lb (68.04 kg)   Body mass index is 26.58 kg/(m^2).   Physical Exam  Constitutional: She appears well-developed and well-nourished. No distress.  HENT:  Head: Normocephalic and atraumatic.  Right Ear: External ear normal.  Left Ear: External ear normal.  Nose: Nose normal.  Mouth/Throat: Oropharynx is clear and moist. No oropharyngeal exudate.  Normal ear canals and TM b/l  Eyes: Conjunctivae are normal.  Neck: Normal range of motion. Neck supple. No tracheal deviation present. No thyromegaly present.  Tenderness with light palpation left side of neck from below ear/anlge of jaw down neck ( mid way down neck).  No TMJ pain, no tenderness on SCM, no swelling, no redness  Cardiovascular: Normal rate, regular rhythm and normal heart sounds.   No murmur heard. Pulmonary/Chest: Effort normal and breath sounds normal. No respiratory distress. She has no wheezes. She has no rales.  Lymphadenopathy:    She has no cervical adenopathy.  Skin: Skin is warm and dry. No rash noted. She is not diaphoretic. No erythema.        Assessment & Plan:   Left neck pain Exam shows tenderness with light palpation, but no redness, swelling or lymphadenopathy It does not appear to be a salivary gland infection or stone, TMJ or ear infection No significant lymphadenopathy on exam and skin appears normal Will order a Ct scan of her neck to evaluate further Continue aspirin for now, but take with food to protect stomach  Call if symptoms worsen or change prior to imaging

## 2015-06-08 NOTE — Patient Instructions (Signed)
  I have ordered a Ct scan of your neck. We will call you to schedule this.  If your symptoms change before you have this done please call us and let us know.   Continue the aspirin for the pain.  Take with food.

## 2015-06-16 ENCOUNTER — Other Ambulatory Visit: Payer: Self-pay | Admitting: Internal Medicine

## 2015-06-16 ENCOUNTER — Other Ambulatory Visit (INDEPENDENT_AMBULATORY_CARE_PROVIDER_SITE_OTHER): Payer: Federal, State, Local not specified - PPO

## 2015-06-16 DIAGNOSIS — I10 Essential (primary) hypertension: Secondary | ICD-10-CM | POA: Diagnosis not present

## 2015-06-16 LAB — BASIC METABOLIC PANEL
BUN: 13 mg/dL (ref 6–23)
CALCIUM: 9.7 mg/dL (ref 8.4–10.5)
CHLORIDE: 100 meq/L (ref 96–112)
CO2: 26 meq/L (ref 19–32)
Creatinine, Ser: 0.78 mg/dL (ref 0.40–1.20)
GFR: 93.41 mL/min (ref >60.00)
GLUCOSE: 150 mg/dL — AB (ref 70–99)
Potassium: 4.5 mEq/L (ref 3.5–5.1)
SODIUM: 138 meq/L (ref 135–145)

## 2015-06-18 ENCOUNTER — Ambulatory Visit (INDEPENDENT_AMBULATORY_CARE_PROVIDER_SITE_OTHER)
Admission: RE | Admit: 2015-06-18 | Discharge: 2015-06-18 | Disposition: A | Discharge status: Home / Self Care | Payer: Federal, State, Local not specified - PPO | Source: Ambulatory Visit | Attending: Internal Medicine | Admitting: Internal Medicine

## 2015-06-18 DIAGNOSIS — M542 Cervicalgia: Secondary | ICD-10-CM | POA: Diagnosis not present

## 2015-06-18 MED ORDER — IOHEXOL 300 MG/ML  SOLN: 75.0000 mL | Freq: Once | INTRAMUSCULAR | Status: DC | PRN

## 2015-06-24 ENCOUNTER — Other Ambulatory Visit: Payer: Self-pay | Admitting: Internal Medicine

## 2015-06-24 DIAGNOSIS — M542 Cervicalgia: Secondary | ICD-10-CM

## 2015-08-05 LAB — HM DIABETES EYE EXAM

## 2015-08-06 ENCOUNTER — Telehealth: Payer: Self-pay | Admitting: *Deleted

## 2015-08-06 MED ORDER — GLIMEPIRIDE 2 MG PO TABS: 2.0000 mg | ORAL_TABLET | Freq: Every day | ORAL | Status: DC

## 2015-08-06 NOTE — Telephone Encounter (Signed)
Left msg on triage stating pharmacy has sent refill request on her Glimepiride week ago still haven't receive back and she is out. Also on her Kombigliyze med coat $100 wanting to know is their a generic that she can take that is cheaper. Per chart no electronic script for Glimepiride sent refill to pharmacy. Pls advise on alternative for Kombigliyze...Brooke [name]

## 2015-08-06 NOTE — Telephone Encounter (Signed)
Can she call her insurance to see what would be covered better that is similar?

## 2015-08-06 NOTE — Telephone Encounter (Signed)
Called pt no answer LMOM Glimepiride sent to [facility] & MD response on Kombigliyze...Johny Chess

## 2015-08-10 MED ORDER — SAXAGLIPTIN-METFORMIN ER 5-1000 MG PO TB24: 1.0000 | ORAL_TABLET | Freq: Every day | ORAL | Status: DC

## 2015-08-10 NOTE — Telephone Encounter (Signed)
Pt called back and she did receive another coupon for the [place] and she is requesting a prescription for that be sent to [facility]. They already have the coupon.

## 2015-08-10 NOTE — Telephone Encounter (Signed)
Resent rx to [facility]...Johny Chess

## 2015-08-10 NOTE — Addendum Note (Signed)
Addended by: Earnstine Regal on: 08/10/2015 11:06 AM   Modules accepted: Orders

## 2015-08-16 ENCOUNTER — Telehealth: Payer: Self-pay | Admitting: Internal Medicine

## 2015-08-16 DIAGNOSIS — J209 Acute bronchitis, unspecified: Secondary | ICD-10-CM

## 2015-08-16 MED ORDER — OSELTAMIVIR PHOSPHATE 75 MG PO CAPS: 75.0000 mg | ORAL_CAPSULE | Freq: Two times a day (BID) | ORAL | Status: DC

## 2015-08-16 NOTE — Telephone Encounter (Signed)
Spoke with pt, aware of results/recs.  cxr ordered, tamiflu sent in.  Nothing further needed.

## 2015-08-16 NOTE — Telephone Encounter (Signed)
Spoke with pt, c/o chills, body aches, chest congestion, nonprod cough X4-5 days.  Denies fever, sinus congestion.  Pt wonders if she needs a cxr to rule out anything.  Pt has been taking Mucinex to help with s/s.   [facility] uses [facility] on [address].  Last ov: 10/27/2014 Next ov: 10/27/2015  CY please advise.  Thanks!  Allergies  Allergen Reactions  . Codeine Nausea And Vomiting   Current Outpatient Prescriptions on File Prior to Visit  Medication Sig Dispense Refill  . ALPRAZolam (XANAX) 0.5 MG tablet Take 0.25 mg by mouth daily as needed for anxiety.     Marland Kitchen aspirin 81 MG tablet Take 81 mg by mouth daily.    Marland Kitchen atorvastatin (LIPITOR) 40 MG tablet Take 1 tablet (40 mg total) by mouth daily. 90 tablet 3  . ferrous sulfate 325 (65 FE) MG EC tablet Take 1 tablet (325 mg total) by mouth 2 (two) times daily as needed. (Patient taking differently: Take 325 mg by mouth daily. ) 60 tablet 3  . FLUoxetine (PROZAC) 10 MG tablet take 1 tablet by mouth once daily 30 tablet 6  . glimepiride (AMARYL) 2 MG tablet Take 1 tablet (2 mg total) by mouth daily with breakfast. 90 tablet 1  . losartan (COZAAR) 50 MG tablet Take 1 tablet (50 mg total) by mouth daily. 90 tablet 3  . PROAIR HFA 108 (90 BASE) MCG/ACT inhaler inhale 2 puffs every 4 hours if needed 8.5 Inhaler 6  . Saxagliptin-Metformin (KOMBIGLYZE XR) 10-998 MG TB24 Take 1 tablet by mouth daily. 30 tablet 6   No current facility-administered medications on file prior to visit.

## 2015-08-16 NOTE — Telephone Encounter (Signed)
Ok CXR- dx acute bronchitis  Offer Tamiflu  75 mg, # 10, 1 twice daily

## 2015-08-17 ENCOUNTER — Ambulatory Visit (INDEPENDENT_AMBULATORY_CARE_PROVIDER_SITE_OTHER)
Admission: RE | Admit: 2015-08-17 | Discharge: 2015-08-17 | Disposition: A | Discharge status: Home / Self Care | Payer: Federal, State, Local not specified - PPO | Source: Ambulatory Visit | Attending: Internal Medicine | Admitting: Internal Medicine

## 2015-08-17 DIAGNOSIS — J209 Acute bronchitis, unspecified: Secondary | ICD-10-CM | POA: Diagnosis not present

## 2015-10-27 ENCOUNTER — Ambulatory Visit (INDEPENDENT_AMBULATORY_CARE_PROVIDER_SITE_OTHER): Payer: Federal, State, Local not specified - PPO | Admitting: Internal Medicine

## 2015-10-27 ENCOUNTER — Encounter: Payer: Self-pay | Admitting: Internal Medicine

## 2015-10-27 VITALS — BP 128/80 | HR 76 | Ht 63.0 in | Wt 146.4 lb

## 2015-10-27 DIAGNOSIS — D649 Anemia, unspecified: Secondary | ICD-10-CM | POA: Diagnosis not present

## 2015-10-27 DIAGNOSIS — D869 Sarcoidosis, unspecified: Secondary | ICD-10-CM | POA: Diagnosis not present

## 2015-10-27 DIAGNOSIS — Z23 Encounter for immunization: Secondary | ICD-10-CM

## 2015-10-27 MED ORDER — ALBUTEROL SULFATE HFA 108 (90 BASE) MCG/ACT IN AERS: INHALATION_SPRAY | RESPIRATORY_TRACT | Status: DC

## 2015-10-27 NOTE — Progress Notes (Signed)
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 Hx pleural effusion, Hx DVT, Hx allergic rhinitis FOLLOWS FOR: pt reports having SOB from time to time-- denies any other concerns She thought it was time to come back for reassessment. Stable dyspnea on exertion carrying, pushing, climbing stairs. Denies night sweats, adenopathy, fever, chest pain or palpitation, calf pain or swelling. No cough. Was treated for chronic urticaria with prednisone and then maintenance therapy with Zyrtec during the day and Vistaril at night. She is now just using Zyrtec with no hives in months. She had not been asked to stop lisinopril which we discussed. CXR 11/12/07 IMPRESSION:  1. Mild increase in pleural effusions and associated bibasilar air  space opacities.  2. Stable biapical subpleural scarring.  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 Hx pleural effusion, Hx DVT, Hx allergic rhinitis FOLLOWS FOR: States she has good days and bad days. C/o SOB with mild-moderate activity and chest tightness with weather change  and when SOB. When pt gets hot flashes it "cuts off the oxygen" and is unable to breathe smoothly. Denies CP.  CXR 08/26/12 IMPRESSION:  Bibasilar densities, improved since prior study. Residual  opacities likely reflect scarring.  Decreasing small bilateral effusions. Residual small effusions  versus pleural thickening.  Original Report Authenticated By: Rolm Baptise, M.D.  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 Hx pleural effusion, Hx DVT, Hx allergic rhinitis FOLLOWS FOR: "no big changes in breathing" per patient.  Diagnosed with anemia this winter and given 2  units of packed red cells. No endoscopic cause. Now on iron. Hemoglobin up to 12.1 as of last check and she feels stronger. No acute respiratory events reviewed she denies routine cough, phlegm, night sweats. CXR 08/26/13 IMPRESSION Chronic changes without acute abnormality SIGNATURE Electronically Signed  By: Inez Catalina M.D.  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 History pleural effusion, DVT, allergic rhinitis FOLLOWS FOR: Pt states she had rough time with breathing through the flu season but now back to her baseline. Flu syndrome this winter but largely resolved now. Back to walking and exercising. Occasional use of rescue inhaler, asks refill. No acute problems. CXR 08/17/2015-stable scarring in the lung bases and apices with normal heart size, NAD  ROS-see HPI Constitutional:   No-   weight loss, +night sweats, fevers, chills, fatigue, lassitude. HEENT:   No-  headaches, difficulty swallowing, tooth/dental problems, sore throat,       No-  sneezing, itching, ear ache, nasal congestion, post nasal drip,  CV:  No-   chest pain, orthopnea, PND, swelling in lower extremities, anasarca,                                                                              dizziness, palpitations Resp: +shortness of breath with exertion or at rest.  No-   productive cough,  No non-productive cough,  No- coughing up of blood.              No-   change in color of mucus.  No- wheezing.   Skin: No-   rash or lesions. GI:  No-   heartburn, indigestion, abdominal pain, nausea, vomiting,  GU: . MS:  No-   joint pain or swelling.  . Neuro-     nothing unusual Psych:  No- change in mood or affect. No depression or anxiety.  No memory loss.  OBJ- Physical Exam General- Alert, Oriented, Affect-appropriate, Distress- none acute. +Looks  well. Skin- rash-none, lesions- none, excoriation- none Lymphadenopathy- none Head- atraumatic            Eyes- Gross vision intact, PERRLA, conjunctivae and secretions clear            Ears- Hearing, canals-normal            Nose- Clear, no-Septal dev, mucus, polyps, erosion, perforation             Throat- Mallampati II , mucosa clear , drainage- none, tonsils- atrophic Neck- flexible , trachea midline, no stridor , thyroid nl, carotid no bruit Chest - symmetrical excursion , unlabored           Heart/CV- RRR , no murmur , no gallop  , no rub, nl s1 s2                           - JVD- none , edema- none, stasis changes- none, varices- none           Lung- , + decreased in bases, wheeze- none, cough- none ,   dullness-none, rub- none           Chest wall-  Abd-  Br/ Gen/ Rectal- Not done, not indicated Extrem- cyanosis- none, clubbing, none, atrophy- none, strength- nl Neuro- grossly intact to observation

## 2015-10-27 NOTE — Assessment & Plan Note (Deleted)
She remains stable. Recovered from a sustained viral bronchitis this winter. 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 Her DVT was without ulnar embolism. She has no other sustained explanation for her chronic lung disease with restriction and limited reserve but no progression. Plan-Prevnar 13 pneumonia vaccine. Refilled albuterol rescue inhaler.

## 2015-10-27 NOTE — Assessment & Plan Note (Signed)
She remains stable. Recovered from a sustained viral bronchitis this winter. 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 Her DVT was without pulmonary embolism.  Chronic lung disease with restriction and limited reserve but no progression. Plan-Prevnar 13 pneumonia vaccine. Refilled albuterol rescue inhaler.

## 2015-10-27 NOTE — Patient Instructions (Signed)
Proair albuterol rescue inhaler refill sent  Prevnar 13 pneumonia vaccine ( 1- time shot)  Please call as needed

## 2015-12-04 ENCOUNTER — Other Ambulatory Visit: Payer: Self-pay | Admitting: Internal Medicine

## 2015-12-06 ENCOUNTER — Other Ambulatory Visit: Payer: Self-pay | Admitting: *Deleted

## 2015-12-06 MED ORDER — LOSARTAN POTASSIUM 50 MG PO TABS: 50.0000 mg | ORAL_TABLET | Freq: Every day | ORAL | Status: DC

## 2015-12-28 LAB — HM MAMMOGRAPHY

## 2016-01-07 ENCOUNTER — Encounter: Payer: Self-pay | Admitting: Geriatric Medicine

## 2016-03-28 ENCOUNTER — Other Ambulatory Visit: Payer: Self-pay | Admitting: Internal Medicine

## 2016-07-07 ENCOUNTER — Other Ambulatory Visit: Payer: Self-pay | Admitting: Internal Medicine

## 2016-07-11 ENCOUNTER — Ambulatory Visit (INDEPENDENT_AMBULATORY_CARE_PROVIDER_SITE_OTHER): Payer: Federal, State, Local not specified - PPO | Admitting: Internal Medicine

## 2016-07-11 ENCOUNTER — Encounter: Payer: Self-pay | Admitting: Internal Medicine

## 2016-07-11 ENCOUNTER — Other Ambulatory Visit (INDEPENDENT_AMBULATORY_CARE_PROVIDER_SITE_OTHER): Payer: Federal, State, Local not specified - PPO

## 2016-07-11 VITALS — BP 150/88 | HR 88 | Temp 97.9°F | Resp 14 | Ht 63.0 in | Wt 150.5 lb

## 2016-07-11 DIAGNOSIS — I10 Essential (primary) hypertension: Secondary | ICD-10-CM

## 2016-07-11 DIAGNOSIS — Z0000 Encounter for general adult medical examination without abnormal findings: Secondary | ICD-10-CM | POA: Diagnosis not present

## 2016-07-11 DIAGNOSIS — E785 Hyperlipidemia, unspecified: Secondary | ICD-10-CM | POA: Diagnosis not present

## 2016-07-11 DIAGNOSIS — E119 Type 2 diabetes mellitus without complications: Secondary | ICD-10-CM

## 2016-07-11 LAB — COMPREHENSIVE METABOLIC PANEL
ALBUMIN: 4.4 g/dL (ref 3.5–5.2)
ALK PHOS: 91 U/L (ref 39–117)
ALT: 12 U/L (ref 0–35)
AST: 13 U/L (ref 0–37)
BILIRUBIN TOTAL: 0.4 mg/dL (ref 0.2–1.2)
BUN: 13 mg/dL (ref 6–23)
CALCIUM: 9.5 mg/dL (ref 8.4–10.5)
CO2: 29 mEq/L (ref 19–32)
CREATININE: 0.84 mg/dL (ref 0.40–1.20)
Chloride: 100 mEq/L (ref 96–112)
GFR: 85.5 mL/min (ref >60.00)
Glucose, Bld: 153 mg/dL — ABNORMAL HIGH (ref 70–99)
Potassium: 4.5 mEq/L (ref 3.5–5.1)
Sodium: 136 mEq/L (ref 135–145)
TOTAL PROTEIN: 7.2 g/dL (ref 6.0–8.3)

## 2016-07-11 LAB — CBC
HCT: 38 % (ref 36.0–46.0)
HEMOGLOBIN: 12.7 g/dL (ref 12.0–15.0)
MCHC: 33.4 g/dL (ref 30.0–36.0)
MCV: 87.8 fl (ref 78.0–100.0)
PLATELETS: 300 10*3/uL (ref 150.0–400.0)
RBC: 4.33 Mil/uL (ref 3.87–5.11)
RDW: 13.9 % (ref 11.5–15.5)
WBC: 4.9 10*3/uL (ref 4.0–10.5)

## 2016-07-11 LAB — HEMOGLOBIN A1C: Hgb A1c MFr Bld: 7.7 % — ABNORMAL HIGH (ref 4.6–6.5)

## 2016-07-11 LAB — LIPID PANEL
CHOLESTEROL: 211 mg/dL — AB (ref 0–200)
HDL: 54.4 mg/dL (ref >39.00)
LDL Cholesterol: 130 mg/dL — ABNORMAL HIGH (ref 0–99)
NonHDL: 156.66
Total CHOL/HDL Ratio: 4
Triglycerides: 133 mg/dL (ref 0.0–149.0)
VLDL: 26.6 mg/dL (ref 0.0–40.0)

## 2016-07-11 MED ORDER — ATORVASTATIN CALCIUM 40 MG PO TABS: 40.0000 mg | ORAL_TABLET | Freq: Every day | ORAL | 3 refills | Status: DC

## 2016-07-11 MED ORDER — LOSARTAN POTASSIUM 100 MG PO TABS: 100.0000 mg | ORAL_TABLET | Freq: Every day | ORAL | 1 refills | Status: DC

## 2016-07-11 NOTE — Assessment & Plan Note (Signed)
Foot exam done, eye exam up to date. Does have peripheral neuropathy which is from before her diagnosis of diabetes. She is taking glimepiride and kombiglyze without side effects. Checking HgA1c and CMP for complications. Is on ARB and statin.

## 2016-07-11 NOTE — Assessment & Plan Note (Signed)
She has already had flu shot this year. Eye exam and mammogram up to date. Colonoscopy up to date. Declines shingles shot. Tetanus up to date. Pneumonia series up to date. Counseled on sun safety and the dangers of distracted driving. Given screening recommendations.

## 2016-07-11 NOTE — Progress Notes (Signed)
Pre visit review using our clinic review tool, if applicable. No additional management support is needed unless otherwise documented below in the visit note. 

## 2016-07-11 NOTE — Assessment & Plan Note (Signed)
BP above goal and increasing losartan to 100 mg daily. Checking CMP and adjust as needed.

## 2016-07-11 NOTE — Assessment & Plan Note (Signed)
Checking lipid panel as none in about 2 years due to no follow up. Taking lipitor 40 mg daily. Adjust as needed for LDL goal <100 or <70.

## 2016-07-11 NOTE — Patient Instructions (Signed)
We will check the labs today and call you back with the results.   We have refilled the medicines and the xanax.   If the blood pressure is still high we will increase the losartan.   If it is normal we will have you check at home a couple times per month and call us if it is more than 140 for the top number or more than 90 for the bottom number.   Health Maintenance, Female Introduction Adopting a healthy lifestyle and getting preventive care can go a long way to promote health and wellness. Talk with your health care provider about what schedule of regular examinations is right for you. This is a good chance for you to check in with your provider about disease prevention and staying healthy. In between checkups, there are plenty of things you can do on your own. Experts have done a lot of research about which lifestyle changes and preventive measures are most likely to keep you healthy. Ask your health care provider for more information. Weight and diet Eat a healthy diet  Be sure to include plenty of vegetables, fruits, low-fat dairy products, and lean protein.  Do not eat a lot of foods high in solid fats, added sugars, or salt.  Get regular exercise. This is one of the most important things you can do for your health.  Most adults should exercise for at least 150 minutes each week. The exercise should increase your heart rate and make you sweat (moderate-intensity exercise).  Most adults should also do strengthening exercises at least twice a week. This is in addition to the moderate-intensity exercise. Maintain a healthy weight  Body mass index (BMI) is a measurement that can be used to identify possible weight problems. It estimates body fat based on height and weight. Your health care provider can help determine your BMI and help you achieve or maintain a healthy weight.  For females 73 years of age and older:  A BMI below 18.5 is considered underweight.  A BMI of 18.5 to 24.9  is normal.  A BMI of 25 to 29.9 is considered overweight.  A BMI of 30 and above is considered obese. Watch levels of cholesterol and blood lipids  You should start having your blood tested for lipids and cholesterol at 73 years of age, then have this test every 5 years.  You may need to have your cholesterol levels checked more often if:  Your lipid or cholesterol levels are high.  You are older than 73 years of age.  You are at high risk for heart disease. Cancer screening Lung Cancer  Lung cancer screening is recommended for adults 12-73 years old who are at high risk for lung cancer because of a history of smoking.  A yearly low-dose CT scan of the lungs is recommended for people who:  Currently smoke.  Have quit within the past 15 years.  Have at least a 30-pack-year history of smoking. A pack year is smoking an average of one pack of cigarettes a day for 1 year.  Yearly screening should continue until it has been 15 years since you quit.  Yearly screening should stop if you develop a health problem that would prevent you from having lung cancer treatment. Breast Cancer  Practice breast self-awareness. This means understanding how your breasts normally appear and feel.  It also means doing regular breast self-exams. Let your health care provider know about any changes, no matter how small.  If you are  in your 73s or 30s, you should have a clinical breast exam (CBE) by a health care provider every 1-3 years as part of a regular health exam.  If you are 27 or older, have a CBE every year. Also consider having a breast X-ray (mammogram) every year.  If you have a family history of breast cancer, talk to your health care provider about genetic screening.  If you are at high risk for breast cancer, talk to your health care provider about having an MRI and a mammogram every year.  Breast cancer gene (BRCA) assessment is recommended for women who have family members with  BRCA-related cancers. BRCA-related cancers include:  Breast.  Ovarian.  Tubal.  Peritoneal cancers.  Results of the assessment will determine the need for genetic counseling and BRCA1 and BRCA2 testing. Cervical Cancer  Your health care provider may recommend that you be screened regularly for cancer of the pelvic organs (ovaries, uterus, and vagina). This screening involves a pelvic examination, including checking for microscopic changes to the surface of your cervix (Pap test). You may be encouraged to have this screening done every 3 years, beginning at age 73.  For women ages 20-65, health care providers may recommend pelvic exams and Pap testing every 3 years, or they may recommend the Pap and pelvic exam, combined with testing for human papilloma virus (HPV), every 5 years. Some types of HPV increase your risk of cervical cancer. Testing for HPV may also be done on women of any age with unclear Pap test results.  Other health care providers may not recommend any screening for nonpregnant women who are considered low risk for pelvic cancer and who do not have symptoms. Ask your health care provider if a screening pelvic exam is right for you.  If you have had past treatment for cervical cancer or a condition that could lead to cancer, you need Pap tests and screening for cancer for at least 20 years after your treatment. If Pap tests have been discontinued, your risk factors (such as having a new sexual partner) need to be reassessed to determine if screening should resume. Some women have medical problems that increase the chance of getting cervical cancer. In these cases, your health care provider may recommend more frequent screening and Pap tests. Colorectal Cancer  This type of cancer can be detected and often prevented.  Routine colorectal cancer screening usually begins at 73 years of age and continues through 73 years of age.  Your health care provider may recommend screening at  an earlier age if you have risk factors for colon cancer.  Your health care provider may also recommend using home test kits to check for hidden blood in the stool.  A small camera at the end of a tube can be used to examine your colon directly (sigmoidoscopy or colonoscopy). This is done to check for the earliest forms of colorectal cancer.  Routine screening usually begins at age 45.  Direct examination of the colon should be repeated every 5-10 years through 73 years of age. However, you may need to be screened more often if early forms of precancerous polyps or small growths are found. Skin Cancer  Check your skin from head to toe regularly.  Tell your health care provider about any new moles or changes in moles, especially if there is a change in a mole's shape or color.  Also tell your health care provider if you have a mole that is larger than the size of  a pencil eraser.  Always use sunscreen. Apply sunscreen liberally and repeatedly throughout the day.  Protect yourself by wearing long sleeves, pants, a wide-brimmed hat, and sunglasses whenever you are outside. Heart disease, diabetes, and high blood pressure  High blood pressure causes heart disease and increases the risk of stroke. High blood pressure is more likely to develop in:  People who have blood pressure in the high end of the normal range (130-139/85-89 mm Hg).  People who are overweight or obese.  People who are African American.  If you are 6-45 years of age, have your blood pressure checked every 3-5 years. If you are 6 years of age or older, have your blood pressure checked every year. You should have your blood pressure measured twice-once when you are at a hospital or clinic, and once when you are not at a hospital or clinic. Record the average of the two measurements. To check your blood pressure when you are not at a hospital or clinic, you can use:  An automated blood pressure machine at a pharmacy.  A  home blood pressure monitor.  If you are between 39 years and 70 years old, ask your health care provider if you should take aspirin to prevent strokes.  Have regular diabetes screenings. This involves taking a blood sample to check your fasting blood sugar level.  If you are at a normal weight and have a low risk for diabetes, have this test once every three years after 73 years of age.  If you are overweight and have a high risk for diabetes, consider being tested at a younger age or more often. Preventing infection Hepatitis B  If you have a higher risk for hepatitis B, you should be screened for this virus. You are considered at high risk for hepatitis B if:  You were born in a country where hepatitis B is common. Ask your health care provider which countries are considered high risk.  Your parents were born in a high-risk country, and you have not been immunized against hepatitis B (hepatitis B vaccine).  You have HIV or AIDS.  You use needles to inject street drugs.  You live with someone who has hepatitis B.  You have had sex with someone who has hepatitis B.  You get hemodialysis treatment.  You take certain medicines for conditions, including cancer, organ transplantation, and autoimmune conditions. Hepatitis C  Blood testing is recommended for:  Everyone born from [birth year] through 1967.  Anyone with known risk factors for hepatitis C. Sexually transmitted infections (STIs)  You should be screened for sexually transmitted infections (STIs) including gonorrhea and chlamydia if:  You are sexually active and are younger than 73 years of age.  You are older than 73 years of age and your health care provider tells you that you are at risk for this type of infection.  Your sexual activity has changed since you were last screened and you are at an increased risk for chlamydia or gonorrhea. Ask your health care provider if you are at risk.  If you do not have HIV, but are  at risk, it may be recommended that you take a prescription medicine daily to prevent HIV infection. This is called pre-exposure prophylaxis (PrEP). You are considered at risk if:  You are sexually active and do not regularly use condoms or know the HIV status of your partner(s).  You take drugs by injection.  You are sexually active with a partner who has HIV. Talk with  your health care provider about whether you are at high risk of being infected with HIV. If you choose to begin PrEP, you should first be tested for HIV. You should then be tested every 3 months for as long as you are taking PrEP. Pregnancy  If you are premenopausal and you may become pregnant, ask your health care provider about preconception counseling.  If you may become pregnant, take 400 to 800 micrograms (mcg) of folic acid every day.  If you want to prevent pregnancy, talk to your health care provider about birth control (contraception). Osteoporosis and menopause  Osteoporosis is a disease in which the bones lose minerals and strength with aging. This can result in serious bone fractures. Your risk for osteoporosis can be identified using a bone density scan.  If you are 53 years of age or older, or if you are at risk for osteoporosis and fractures, ask your health care provider if you should be screened.  Ask your health care provider whether you should take a calcium or vitamin D supplement to lower your risk for osteoporosis.  Menopause may have certain physical symptoms and risks.  Hormone replacement therapy may reduce some of these symptoms and risks. Talk to your health care provider about whether hormone replacement therapy is right for you. Follow these instructions at home:  Schedule regular health, dental, and eye exams.  Stay current with your immunizations.  Do not use any tobacco products including cigarettes, chewing tobacco, or electronic cigarettes.  If you are pregnant, do not drink  alcohol.  If you are breastfeeding, limit how much and how often you drink alcohol.  Limit alcohol intake to no more than 1 drink per day for nonpregnant women. One drink equals 12 ounces of beer, 5 ounces of wine, or 1 ounces of hard liquor.  Do not use street drugs.  Do not share needles.  Ask your health care provider for help if you need support or information about quitting drugs.  Tell your health care provider if you often feel depressed.  Tell your health care provider if you have ever been abused or do not feel safe at home. This information is not intended to replace advice given to you by your health care provider. Make sure you discuss any questions you have with your health care provider. Document Released: 12/19/2010 Document Revised: 11/11/2015 Document Reviewed: 03/09/2015  2017 Elsevier

## 2016-07-11 NOTE — Progress Notes (Signed)
   Subjective:    Patient ID: Brooke [name], female    DOB: Aug 07, 1945, 71 y.o.   MRN: [id number]  HPI The patient is a 71 YO female coming in for wellness. No new concerns.   PMH, [facility], social history reviewed and updated.   Review of Systems  Constitutional: Negative.   HENT: Negative.   Eyes: Negative.   Respiratory: Negative for cough, chest tightness and shortness of breath.   Cardiovascular: Negative for chest pain, palpitations and leg swelling.  Gastrointestinal: Negative for abdominal distention, abdominal pain, constipation, diarrhea, nausea and vomiting.  Musculoskeletal: Negative.   Skin: Negative.   Neurological: Negative.   Psychiatric/Behavioral: Negative.       Objective:   Physical Exam  Constitutional: She is oriented to person, place, and time. She appears well-developed and well-nourished.  HENT:  Head: Normocephalic and atraumatic.  Eyes: EOM are normal.  Neck: Normal range of motion.  Cardiovascular: Normal rate and regular rhythm.   Pulmonary/Chest: Effort normal and breath sounds normal. No respiratory distress. She has no wheezes. She has no rales.  Abdominal: Soft. Bowel sounds are normal. She exhibits no distension. There is no tenderness. There is no rebound.  Musculoskeletal: She exhibits no edema.  Neurological: She is alert and oriented to person, place, and time. Coordination normal.  Skin: Skin is warm and dry.  Psychiatric: She has a normal mood and affect.   Vitals:   07/11/16 0817 07/11/16 0844  BP: (!) 160/94 (!) 150/88  Pulse: 88   Resp: 14   Temp: 97.9 F (36.6 C)   TempSrc: Oral   SpO2: 97%   Weight: 150 lb 8 oz (68.3 kg)   Height: 5\' 3"  (1.6 m)       Assessment & Plan:

## 2016-07-20 ENCOUNTER — Telehealth: Payer: Self-pay | Admitting: *Deleted

## 2016-07-20 MED ORDER — ALPRAZOLAM 0.5 MG PO TABS: 0.2500 mg | ORAL_TABLET | Freq: Every day | ORAL | 0 refills | Status: DC | PRN

## 2016-07-20 NOTE — Telephone Encounter (Signed)
Pt left msg on triage yesterday afternoon stating at her last appt MD stated she would refill her alprazolam. Pharmacy states they never recieved script. Pt requesting refill to be sent to [facility]...Johny Chess

## 2016-07-20 NOTE — Telephone Encounter (Signed)
Printed and signed.  

## 2016-07-20 NOTE — Telephone Encounter (Signed)
Tried calling pt no answer LMOM rx faxed to [facility]...Brooke [name]

## 2016-08-09 ENCOUNTER — Other Ambulatory Visit: Payer: Self-pay | Admitting: Internal Medicine

## 2016-08-16 ENCOUNTER — Telehealth: Payer: Self-pay | Admitting: Internal Medicine

## 2016-08-16 NOTE — Telephone Encounter (Signed)
Pt is calling to get a refill on her FLUoxetine (PROZAC) 10 MG tablet [id number]

## 2016-08-16 NOTE — Telephone Encounter (Signed)
Contacted pharmacy since she should have refills and got everything fixed, medication is ready at pharmacy and patient is aware

## 2016-10-26 ENCOUNTER — Other Ambulatory Visit (INDEPENDENT_AMBULATORY_CARE_PROVIDER_SITE_OTHER): Payer: Federal, State, Local not specified - PPO

## 2016-10-26 ENCOUNTER — Ambulatory Visit (INDEPENDENT_AMBULATORY_CARE_PROVIDER_SITE_OTHER)
Admission: RE | Admit: 2016-10-26 | Discharge: 2016-10-26 | Disposition: A | Discharge status: Home / Self Care | Payer: Federal, State, Local not specified - PPO | Source: Ambulatory Visit | Attending: Internal Medicine | Admitting: Internal Medicine

## 2016-10-26 ENCOUNTER — Ambulatory Visit (INDEPENDENT_AMBULATORY_CARE_PROVIDER_SITE_OTHER): Payer: Federal, State, Local not specified - PPO | Admitting: Internal Medicine

## 2016-10-26 ENCOUNTER — Encounter: Payer: Self-pay | Admitting: Internal Medicine

## 2016-10-26 VITALS — BP 124/78 | HR 77 | Ht 63.0 in | Wt 146.4 lb

## 2016-10-26 DIAGNOSIS — D869 Sarcoidosis, unspecified: Secondary | ICD-10-CM

## 2016-10-26 LAB — CBC WITH DIFFERENTIAL/PLATELET
BASOS ABS: 0.1 10*3/uL (ref 0.0–0.1)
BASOS PCT: 1.4 % (ref 0.0–3.0)
EOS ABS: 0.1 10*3/uL (ref 0.0–0.7)
Eosinophils Relative: 2.4 % (ref 0.0–5.0)
HCT: 38.1 % (ref 36.0–46.0)
HEMOGLOBIN: 12.9 g/dL (ref 12.0–15.0)
Lymphocytes Relative: 40 % (ref 12.0–46.0)
Lymphs Abs: 2.2 10*3/uL (ref 0.7–4.0)
MCHC: 33.8 g/dL (ref 30.0–36.0)
MCV: 88.9 fl (ref 78.0–100.0)
MONO ABS: 0.5 10*3/uL (ref 0.1–1.0)
Monocytes Relative: 8.6 % (ref 3.0–12.0)
NEUTROS ABS: 2.6 10*3/uL (ref 1.4–7.7)
Neutrophils Relative %: 47.6 % (ref 43.0–77.0)
PLATELETS: 329 10*3/uL (ref 150.0–400.0)
RBC: 4.29 Mil/uL (ref 3.87–5.11)
RDW: 13.7 % (ref 11.5–15.5)
WBC: 5.4 10*3/uL (ref 4.0–10.5)

## 2016-10-26 LAB — ANGIOTENSIN CONVERTING ENZYME: ANGIOTENSIN-CONVERTING ENZYME: 45 U/L (ref 9–67)

## 2016-10-26 MED ORDER — FLUTICASONE FUROATE-VILANTEROL 100-25 MCG/INH IN AEPB: 1.0000 | INHALATION_SPRAY | Freq: Every day | RESPIRATORY_TRACT | 0 refills | Status: DC

## 2016-10-26 NOTE — Progress Notes (Signed)
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 History pleural effusion, complicated by DVT, allergic rhinitis, DM2 Office Spirometry 10/26/16-mild restriction of exhaled volume. 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 History pleural effusion, DVT, allergic rhinitis FOLLOWS FOR: Pt states she had rough time with breathing through the flu season but now back to her baseline. Flu syndrome this winter but largely resolved now. Back to walking and exercising. Occasional use of rescue inhaler, asks refill. No acute problems. 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 History pleural effusion, DVT, allergic rhinitis, DM2 FOLLOWS FOR: Increased SOB and wheezing through the winter and used Rescue inhaler more than usual. Also, with increase in pollen at this time-wheezing and SOB  has come back-using rescue inhaler again. Has used rescue inhaler 2 or 3 times a week through the winter and spring with no major exacerbations. She denies fever, palpitation, chest pain or sputum. Does still get heavy sweats "worse than hot flashes"-chronic. Not progressive. Most often happens at night. Generally feels well. Office Spirometry 10/26/16-mild restriction of exhaled volume. FVC 1.62/75%, FEV1 1.28/77%, ratio 0.79, FEF 25-75% 1.24/82%  ROS-see HPI Constitutional:   No-   weight loss, +night sweats, fevers, chills, fatigue, lassitude. HEENT:   No-   headaches, difficulty swallowing, tooth/dental problems, sore throat,       No-  sneezing, itching, ear ache, nasal congestion, post nasal drip,  CV:  No-   chest pain, orthopnea, PND, swelling in lower extremities, anasarca,                                                                              dizziness, palpitations Resp: +shortness of breath with exertion or at rest.              No-   productive cough,  No non-productive cough,  No- coughing up of blood.              No-   change in color of mucus.  No- wheezing.   Skin: No-   rash or lesions. GI:  No-   heartburn, indigestion, abdominal pain, nausea, vomiting,  GU: . MS:  No-   joint pain or swelling.  . Neuro-     nothing unusual Psych:  No- change in mood or affect. No depression or anxiety.  No memory loss.  OBJ- Physical Exam     stable baseline exam General- Alert, Oriented, Affect-appropriate, Distress- none acute. +Looks well. Skin- rash-none, lesions- none, excoriation- none Lymphadenopathy- none Head- atraumatic            Eyes- Gross vision intact, PERRLA, conjunctivae and secretions clear            Ears- Hearing, canals-normal            Nose- Clear, no-Septal dev, mucus, polyps, erosion, perforation  Throat- Mallampati II , mucosa clear , drainage- none, tonsils- atrophic Neck- flexible , trachea midline, no stridor , thyroid nl, carotid no bruit Chest - symmetrical excursion , unlabored           Heart/CV- RRR , no murmur , no gallop  , no rub, nl s1 s2                           - JVD- none , edema- none, stasis changes- none, varices- none           Lung- , + decreased in bases, wheeze- none, cough- none ,   dullness-none, rub- none           Chest wall-  Abd-  Br/ Gen/ Rectal- Not done, not indicated Extrem- cyanosis- none, clubbing, none, atrophy- none, strength- nl Neuro- grossly intact to observation

## 2016-10-26 NOTE — Assessment & Plan Note (Signed)
Clinically stable. She probably is going to need a maintenance inhaler but we are giving her a sample of Breo Ellipta to try. Also ordering CXR and CBC with diff for update

## 2016-10-26 NOTE — Progress Notes (Signed)
Patient seen in the office today and instructed on use of Breo 100.  Patient expressed understanding and demonstrated technique.  Katie Welchel,CMA  

## 2016-10-26 NOTE — Patient Instructions (Signed)
Order- office spirometry      Dx sarcoid  Order lab- CBC w diff , ACE level      Order- CXR  Sample Breo Ellipta 100 or 200      Inhale 1 puff, then rinse mouth, once daily

## 2016-12-03 ENCOUNTER — Other Ambulatory Visit: Payer: Self-pay | Admitting: Internal Medicine

## 2017-01-22 LAB — HM MAMMOGRAPHY

## 2017-01-23 ENCOUNTER — Encounter: Payer: Self-pay | Admitting: Internal Medicine

## 2017-01-23 NOTE — Progress Notes (Signed)
Abstracted and sent to scan  

## 2017-01-31 ENCOUNTER — Other Ambulatory Visit: Payer: Self-pay | Admitting: Internal Medicine

## 2017-03-30 ENCOUNTER — Other Ambulatory Visit: Payer: Self-pay | Admitting: Internal Medicine

## 2017-04-06 ENCOUNTER — Other Ambulatory Visit: Payer: Self-pay | Admitting: Internal Medicine

## 2017-05-02 ENCOUNTER — Other Ambulatory Visit: Payer: Self-pay

## 2017-05-02 MED ORDER — GLIMEPIRIDE 2 MG PO TABS: ORAL_TABLET | ORAL | 0 refills | Status: DC

## 2017-06-25 ENCOUNTER — Telehealth: Payer: Self-pay | Admitting: Internal Medicine

## 2017-06-25 MED ORDER — DOXYCYCLINE HYCLATE 100 MG PO TABS: ORAL_TABLET | ORAL | 0 refills | Status: DC

## 2017-06-25 NOTE — Telephone Encounter (Signed)
Spoke with patient. She is aware of CY's recs. Nothing else needed at time of call. Medication has been called into pharmacy.

## 2017-06-25 NOTE — Telephone Encounter (Signed)
Called and spoke with pt. Pt states she developed a cold around 05/30/17. Pt states she used mucinex DM with mild improvement but symptoms did not completely subside.  Pt reports of dry cough at times prod with clear mucus & chest congestion x3wk Denies fever, chills, sweats or body aches.   CY please advise. Thanks.   Current Outpatient Medications on File Prior to Visit  Medication Sig Dispense Refill  . ALPRAZolam (XANAX) 0.5 MG tablet Take 0.5 tablets (0.25 mg total) by mouth daily as needed for anxiety. 30 tablet 0  . aspirin 81 MG tablet Take 81 mg by mouth daily.    Marland Kitchen atorvastatin (LIPITOR) 40 MG tablet Take 1 tablet (40 mg total) by mouth daily. 90 tablet 3  . FLUoxetine (PROZAC) 10 MG tablet Take 1 tablet (10 mg total) by mouth daily. 30 tablet 2  . fluticasone furoate-vilanterol (BREO ELLIPTA) 100-25 MCG/INH AEPB Inhale 1 puff into the lungs daily. 1 each 0  . glimepiride (AMARYL) 2 MG tablet take 1 tablet once daily WITH BREAKFAST. NEED ANNUAL VISIT FOR FURTHER REFILLS 30 tablet 0  . KOMBIGLYZE XR 10-998 MG TB24 take 1 tablet by mouth once daily 30 tablet 6  . latanoprost (XALATAN) 0.005 % ophthalmic solution 1 drop in both eyes at bedtime  0  . losartan (COZAAR) 100 MG tablet take 1 tablet by mouth once daily 90 tablet 1  . PROAIR HFA 108 (90 Base) MCG/ACT inhaler inhale 2 puffs by mouth every 6 hours if needed 8.5 g 5   No current facility-administered medications on file prior to visit.     Allergies  Allergen Reactions  . Codeine Nausea And Vomiting

## 2017-06-25 NOTE — Telephone Encounter (Signed)
Offer doxycycline 100 mg, # 8, 2 today then one daily  Ok to use otc cough and cold meds if they help

## 2017-06-25 NOTE — Telephone Encounter (Signed)
Patient returned call, CB is [phone].

## 2017-06-25 NOTE — Telephone Encounter (Signed)
lmtcb X1 for pt. Will call in rx after speaking to pt.

## 2017-07-03 ENCOUNTER — Ambulatory Visit: Payer: Federal, State, Local not specified - PPO | Admitting: Adult Health

## 2017-07-12 ENCOUNTER — Other Ambulatory Visit: Payer: Self-pay

## 2017-07-12 MED ORDER — FLUOXETINE HCL 10 MG PO TABS: 10.0000 mg | ORAL_TABLET | Freq: Every day | ORAL | 0 refills | Status: DC

## 2017-07-17 ENCOUNTER — Ambulatory Visit: Payer: Federal, State, Local not specified - PPO | Admitting: Adult Health

## 2017-07-17 ENCOUNTER — Ambulatory Visit (INDEPENDENT_AMBULATORY_CARE_PROVIDER_SITE_OTHER)
Admission: RE | Admit: 2017-07-17 | Discharge: 2017-07-17 | Disposition: A | Discharge status: Home / Self Care | Payer: Federal, State, Local not specified - PPO | Source: Ambulatory Visit | Attending: Adult Health | Admitting: Adult Health

## 2017-07-17 ENCOUNTER — Encounter: Payer: Self-pay | Admitting: Adult Health

## 2017-07-17 DIAGNOSIS — J209 Acute bronchitis, unspecified: Secondary | ICD-10-CM | POA: Insufficient documentation

## 2017-07-17 DIAGNOSIS — D869 Sarcoidosis, unspecified: Secondary | ICD-10-CM | POA: Diagnosis not present

## 2017-07-17 MED ORDER — LEVALBUTEROL HCL 0.63 MG/3ML IN NEBU
0.6300 mg | INHALATION_SOLUTION | Freq: Once | RESPIRATORY_TRACT | Status: AC
  Administered 2017-07-17: 0.63 mg via RESPIRATORY_TRACT

## 2017-07-17 MED ORDER — AZITHROMYCIN 250 MG PO TABS: ORAL_TABLET | ORAL | 0 refills | Status: AC

## 2017-07-17 MED ORDER — PREDNISONE 10 MG PO TABS: ORAL_TABLET | ORAL | 0 refills | Status: DC

## 2017-07-17 NOTE — Assessment & Plan Note (Signed)
?  flare   Plan  Patient Instructions  Zpack take as directed  Prednisone taper over next week.  Saline nasal rinses As needed   Mucinex DM Twice daily  As needed  Cough/congestion  Chest xray today .  Follow up Dr. Annamaria Boots  In 2 months and As needed   Please contact office for sooner follow up if symptoms do not improve or worsen or seek emergency care

## 2017-07-17 NOTE — Addendum Note (Signed)
Addended by: Parke Poisson E on: 07/17/2017 02:53 PM   Modules accepted: Orders

## 2017-07-17 NOTE — Addendum Note (Signed)
Addended by: Elton Sin on: 07/17/2017 02:58 PM   Modules accepted: Orders

## 2017-07-17 NOTE — Progress Notes (Signed)
@Patient  ID: Brooke [name], female    DOB: 1945/08/19, 72 y.o.   MRN: [id number]  Chief Complaint  Patient presents with  . 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 FVC 1.62/75%, FEV1 1.28/77%, ratio 0.79, FEF 25-75% 1.24/82%  07/17/2017 Acute OV : Cough  Patient presents for an acute office visit.  She complains of 4-6 weeks of cough, congestion , sinus drainage, congestion , pressure .  She denies fever, chest pain, orthopnea, edema or n/v.d she was called in Doxycycline 3 weeks ago, felt better briefly then symptoms came back.  Had to increase ProAir twice daily for last few weeks.  Has used Mucinex with minimal help .     Allergies  Allergen Reactions  . Codeine Nausea And Vomiting    Immunization History  Administered Date(s) Administered  . Influenza Split 03/19/2012, 03/19/2013  . Influenza, High Dose Seasonal PF 05/02/2016, 03/30/2017  . Influenza,inj,Quad PF,6+ Mos 04/02/2014  . Influenza-Unspecified 05/18/2015  . Pneumococcal Conjugate-13 10/27/2015  . Pneumococcal Polysaccharide-23 06/19/2012  . Tdap 06/19/2010    Past Medical History:  Diagnosis Date  . Allergic rhinitis   . Diabetes mellitus without complication ([place])   . DVT (deep venous thrombosis) ([place])   . Erosive gastritis with hemorrhage 08/09/2014  . Hypertension   . Peripheral neuropathy    uncertain relation to lung disease  . 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 Pleural effusion   . Sarcoidosis     Tobacco History: Social History   Tobacco Use  Smoking Status Former Smoker  . Packs/day: 0.03  . Years: 15.00  . Pack years: 0.45  . Types: Cigarettes  . Last attempt to quit: 06/19/1982  . Years since quitting: 35.1    Smokeless Tobacco Never Used   Counseling given: Not Answered   Outpatient Encounter Medications as of 07/17/2017  Medication Sig  . ALPRAZolam (XANAX) 0.5 MG tablet Take 0.5 tablets (0.25 mg total) by mouth daily as needed for anxiety.  Marland Kitchen aspirin 81 MG tablet Take 81 mg by mouth daily.  Marland Kitchen atorvastatin (LIPITOR) 40 MG tablet Take 1 tablet (40 mg total) by mouth daily.  Marland Kitchen FLUoxetine (PROZAC) 10 MG tablet Take 1 tablet (10 mg total) by mouth daily. Need annual visit for further refills  . fluticasone furoate-vilanterol (BREO ELLIPTA) 100-25 MCG/INH AEPB Inhale 1 puff into the lungs daily.  Marland Kitchen glimepiride (AMARYL) 2 MG tablet take 1 tablet once daily WITH BREAKFAST. NEED ANNUAL VISIT FOR FURTHER REFILLS  . KOMBIGLYZE XR 10-998 MG TB24 take 1 tablet by mouth once daily  . latanoprost (XALATAN) 0.005 % ophthalmic solution 1 drop in both eyes at bedtime  . losartan (COZAAR) 100 MG tablet take 1 tablet by mouth once daily  . PROAIR HFA 108 (90 Base) MCG/ACT inhaler inhale 2 puffs by mouth every 6 hours if needed  . azithromycin (ZITHROMAX Z-PAK) 250 MG tablet Take 2 tablets (500 mg) on  Day 1,  followed by 1 tablet (250 mg) once daily on Days 2 through 5.  . predniSONE (DELTASONE) 10 MG tablet 4 tabs for 2 days, then 3 tabs for 2 days, 2 tabs for 2 days, then 1 tab for 2 days, then stop  . [DISCONTINUED] doxycycline (VIBRA-TABS) 100 MG tablet Take 2 tablets  on first day. Then take 1 tablet daily until finished. (Patient not taking: Reported on 07/17/2017)   No facility-administered encounter medications on file as of 07/17/2017.      Review of Systems  Constitutional:   No  weight loss, night sweats,  Fevers, chills,  +fatigue, or  lassitude.  HEENT:   No headaches,  Difficulty swallowing,  Tooth/dental problems, or  Sore throat,                No sneezing, itching, ear ache,  +nasal congestion, post nasal drip,   CV:  No chest pain,  Orthopnea, PND, swelling in lower extremities, anasarca,  dizziness, palpitations, syncope.   GI  No heartburn, indigestion, abdominal pain, nausea, vomiting, diarrhea, change in bowel habits, loss of appetite, bloody stools.   Resp:   No chest wall deformity  Skin: no rash or lesions.  GU: no dysuria, change in color of urine, no urgency or frequency.  No flank pain, no hematuria   MS:  No joint pain or swelling.  No decreased range of motion.  No back pain.    Physical Exam  BP (!) 146/78 (BP Location: Left Arm, Cuff Size: Normal)   Pulse 96   Ht 5\' 3"  (1.6 m)   Wt 147 lb 12.8 oz (67 kg)   SpO2 99%   BMI 26.18 kg/m   GEN: A/Ox3; pleasant , NAD ,    HEENT:  [place]/AT,  EACs-clear, TMs-wnl, NOSE-clear drainage , max sinus tenderness , THROAT-clear, no lesions, no postnasal drip or exudate noted.   NECK:  Supple w/ fair ROM; no JVD; normal carotid impulses w/o bruits; no thyromegaly or nodules palpated; no lymphadenopathy.    RESP  Few trace rhonchi,  No accessory muscle use, no dullness to percussion  CARD:  RRR, no m/r/g, no peripheral edema, pulses intact, no cyanosis or clubbing.  GI:   Soft & nt; nml bowel sounds; no organomegaly or masses detected.   Musco: Warm bil, no deformities or joint swelling noted.   Neuro: alert, no focal deficits noted.    Skin: Warm, no lesions or rashes    Lab Results:  CBC  ProBNP No results found for: PROBNP  Imaging: No results found.   Assessment & Plan:   Acute bronchitis Flare with URI/Siinusitis   Plan  Patient Instructions  Zpack take as directed  Prednisone taper over next week.  Saline nasal rinses As needed   Mucinex DM Twice daily  As needed  Cough/congestion  Chest xray today .  Follow up Dr. Annamaria Boots  In 2 months and As needed   Please contact office for sooner follow up if symptoms do not improve or worsen or seek emergency care        SARCOIDOSIS, PULMONARY ?flare   Plan  Patient Instructions  Zpack take as directed  Prednisone taper over next week.    Saline nasal rinses As needed   Mucinex DM Twice daily  As needed  Cough/congestion  Chest xray today .  Follow up Dr. Annamaria Boots  In 2 months and As needed   Please contact office for sooner follow up if symptoms do not improve or worsen or seek emergency care           Rexene Edison, NP 07/17/2017

## 2017-07-17 NOTE — Patient Instructions (Signed)
Zpack take as directed  Prednisone taper over next week.  Saline nasal rinses As needed   Mucinex DM Twice daily  As needed  Cough/congestion  Chest xray today .  Follow up Dr. Annamaria Boots  In 2 months and As needed   Please contact office for sooner follow up if symptoms do not improve or worsen or seek emergency care

## 2017-07-17 NOTE — Assessment & Plan Note (Signed)
Flare with URI/Siinusitis   Plan  Patient Instructions  Zpack take as directed  Prednisone taper over next week.  Saline nasal rinses As needed   Mucinex DM Twice daily  As needed  Cough/congestion  Chest xray today .  Follow up Dr. Annamaria Boots  In 2 months and As needed   Please contact office for sooner follow up if symptoms do not improve or worsen or seek emergency care

## 2017-07-23 ENCOUNTER — Telehealth: Payer: Self-pay | Admitting: Internal Medicine

## 2017-07-23 NOTE — Telephone Encounter (Signed)
Notes recorded by Eula Listen, RN on 07/23/2017 at 3:39 PM EST Pt called and given results and recommendations. Pt verbalized understanding and all questions answered. Nothing further needed.

## 2017-08-15 LAB — HM DIABETES EYE EXAM

## 2017-08-15 NOTE — Progress Notes (Signed)
Abstracted and sent to scan  

## 2017-08-22 ENCOUNTER — Other Ambulatory Visit: Payer: Self-pay

## 2017-08-22 MED ORDER — FLUOXETINE HCL 10 MG PO TABS: 10.0000 mg | ORAL_TABLET | Freq: Every day | ORAL | 0 refills | Status: DC

## 2017-08-22 MED ORDER — LOSARTAN POTASSIUM 100 MG PO TABS: 100.0000 mg | ORAL_TABLET | Freq: Every day | ORAL | 1 refills | Status: DC

## 2017-08-29 ENCOUNTER — Encounter: Payer: Self-pay | Admitting: Internal Medicine

## 2017-08-29 ENCOUNTER — Ambulatory Visit (INDEPENDENT_AMBULATORY_CARE_PROVIDER_SITE_OTHER): Payer: Federal, State, Local not specified - PPO | Admitting: Internal Medicine

## 2017-08-29 ENCOUNTER — Other Ambulatory Visit (INDEPENDENT_AMBULATORY_CARE_PROVIDER_SITE_OTHER): Payer: Federal, State, Local not specified - PPO

## 2017-08-29 VITALS — BP 132/88 | HR 72 | Temp 97.8°F | Ht 63.0 in | Wt 145.0 lb

## 2017-08-29 DIAGNOSIS — E119 Type 2 diabetes mellitus without complications: Secondary | ICD-10-CM

## 2017-08-29 DIAGNOSIS — Z0000 Encounter for general adult medical examination without abnormal findings: Secondary | ICD-10-CM

## 2017-08-29 DIAGNOSIS — H42 Glaucoma in diseases classified elsewhere: Secondary | ICD-10-CM | POA: Diagnosis not present

## 2017-08-29 DIAGNOSIS — E1139 Type 2 diabetes mellitus with other diabetic ophthalmic complication: Secondary | ICD-10-CM | POA: Diagnosis not present

## 2017-08-29 DIAGNOSIS — E785 Hyperlipidemia, unspecified: Secondary | ICD-10-CM | POA: Diagnosis not present

## 2017-08-29 DIAGNOSIS — I10 Essential (primary) hypertension: Secondary | ICD-10-CM

## 2017-08-29 DIAGNOSIS — E1169 Type 2 diabetes mellitus with other specified complication: Secondary | ICD-10-CM | POA: Diagnosis not present

## 2017-08-29 DIAGNOSIS — H9190 Unspecified hearing loss, unspecified ear: Secondary | ICD-10-CM | POA: Diagnosis not present

## 2017-08-29 DIAGNOSIS — D869 Sarcoidosis, unspecified: Secondary | ICD-10-CM | POA: Diagnosis not present

## 2017-08-29 DIAGNOSIS — Z1159 Encounter for screening for other viral diseases: Secondary | ICD-10-CM | POA: Diagnosis not present

## 2017-08-29 LAB — COMPREHENSIVE METABOLIC PANEL
ALT: 16 U/L (ref 0–35)
AST: 12 U/L (ref 0–37)
Albumin: 4.3 g/dL (ref 3.5–5.2)
Alkaline Phosphatase: 105 U/L (ref 39–117)
BUN: 12 mg/dL (ref 6–23)
CHLORIDE: 98 meq/L (ref 96–112)
CO2: 30 meq/L (ref 19–32)
CREATININE: 0.85 mg/dL (ref 0.40–1.20)
Calcium: 9.8 mg/dL (ref 8.4–10.5)
GFR: 84.07 mL/min (ref >60.00)
Glucose, Bld: 255 mg/dL — ABNORMAL HIGH (ref 70–99)
POTASSIUM: 4.6 meq/L (ref 3.5–5.1)
Sodium: 136 mEq/L (ref 135–145)
Total Bilirubin: 0.3 mg/dL (ref 0.2–1.2)
Total Protein: 7.1 g/dL (ref 6.0–8.3)

## 2017-08-29 LAB — CBC
HCT: 40.6 % (ref 36.0–46.0)
Hemoglobin: 13.5 g/dL (ref 12.0–15.0)
MCHC: 33.3 g/dL (ref 30.0–36.0)
MCV: 88.7 fl (ref 78.0–100.0)
PLATELETS: 321 10*3/uL (ref 150.0–400.0)
RBC: 4.58 Mil/uL (ref 3.87–5.11)
RDW: 13.5 % (ref 11.5–15.5)
WBC: 5.9 10*3/uL (ref 4.0–10.5)

## 2017-08-29 LAB — LIPID PANEL
CHOL/HDL RATIO: 4
Cholesterol: 179 mg/dL (ref 0–200)
HDL: 43.1 mg/dL (ref >39.00)
LDL CALC: 100 mg/dL — AB (ref 0–99)
NonHDL: 135.99
Triglycerides: 178 mg/dL — ABNORMAL HIGH (ref 0.0–149.0)
VLDL: 35.6 mg/dL (ref 0.0–40.0)

## 2017-08-29 LAB — HEMOGLOBIN A1C: Hgb A1c MFr Bld: 11.2 % — ABNORMAL HIGH (ref 4.6–6.5)

## 2017-08-29 NOTE — Assessment & Plan Note (Signed)
Seeing pulmonary, albuterol prn.

## 2017-08-29 NOTE — Progress Notes (Signed)
   Subjective:    Patient ID: Brooke [name], female    DOB: 1945-08-15, 72 y.o.   MRN: [id number]  HPI The patient is a 72 YO female coming in for physical. No new concerns.   PMH, [facility], social history reviewed and updated.  Dr. Collene Mares for colonoscopy  Review of Systems  Constitutional: Negative.   HENT: Negative.   Eyes: Negative.   Respiratory: Negative for cough, chest tightness and shortness of breath.   Cardiovascular: Negative for chest pain, palpitations and leg swelling.  Gastrointestinal: Negative for abdominal distention, abdominal pain, constipation, diarrhea, nausea and vomiting.  Musculoskeletal: Negative.   Skin: Negative.   Neurological: Negative.   Psychiatric/Behavioral: Negative.       Objective:   Physical Exam  Constitutional: She is oriented to person, place, and time. She appears well-developed and well-nourished.  HENT:  Head: Normocephalic and atraumatic.  Eyes: EOM are normal.  Neck: Normal range of motion.  Cardiovascular: Normal rate and regular rhythm.  Pulmonary/Chest: Effort normal and breath sounds normal. No respiratory distress. She has no wheezes. She has no rales.  Abdominal: Soft. Bowel sounds are normal. She exhibits no distension. There is no tenderness. There is no rebound.  Musculoskeletal: She exhibits no edema.  Neurological: She is alert and oriented to person, place, and time. Coordination normal.  Skin: Skin is warm and dry.  Foot exam done  Psychiatric: She has a normal mood and affect.   Vitals:   08/29/17 0935  BP: 132/88  Pulse: 72  Temp: 97.8 F (36.6 C)  TempSrc: Oral  SpO2: 98%  Weight: 145 lb (65.8 kg)  Height: 5\' 3"  (1.6 m)      Assessment & Plan:

## 2017-08-29 NOTE — Patient Instructions (Signed)

## 2017-08-29 NOTE — Addendum Note (Signed)
Addended by: Pricilla Holm A on: 08/29/2017 11:27 AM   Modules accepted: Orders

## 2017-08-29 NOTE — Assessment & Plan Note (Signed)
Taking kombiglyze and amaryl. Checking HgA1c and adjust as needed. Eye exam up to date. Foot exam done today. Complicated by glaucoma. On ARB and statin.

## 2017-08-29 NOTE — Assessment & Plan Note (Signed)
Taking lipitor 40 mg daily, checking lipid panel and adjust as needed.  

## 2017-08-29 NOTE — Assessment & Plan Note (Signed)
Colonoscopy up to date with Dr. Collene Mares, mammogram up to date. Declines shingrix. Pneumonia and flu and tetanus up to date. Doing hep c screening today. Counseled about sun safety and mole surveillance. Given screening recommendations.

## 2017-08-29 NOTE — Assessment & Plan Note (Signed)
Checking CMP and adjust as needed. Taking losartan 100 mg daily and at goal.

## 2017-08-30 LAB — HEPATITIS C ANTIBODY
Hepatitis C Ab: NONREACTIVE
SIGNAL TO CUT-OFF: 0.01 (ref <1.00)

## 2017-09-03 ENCOUNTER — Other Ambulatory Visit: Payer: Self-pay | Admitting: Internal Medicine

## 2017-09-03 MED ORDER — EMPAGLIFLOZIN 25 MG PO TABS: 25.0000 mg | ORAL_TABLET | Freq: Every day | ORAL | 1 refills | Status: DC

## 2017-09-06 ENCOUNTER — Telehealth: Payer: Self-pay

## 2017-09-06 NOTE — Telephone Encounter (Signed)
Key: [id number]   Submitted today on Cover My Meds.   If [employer] has not responded to your request within 24 hours, contact [employer] at [phone]. If you think there may be a problem with your PA request, use our live chat feature at the bottom right.

## 2017-09-12 NOTE — Telephone Encounter (Signed)
Jardiance approved 08/07/2017-09/06/2018

## 2017-09-14 ENCOUNTER — Encounter: Payer: Self-pay | Admitting: Internal Medicine

## 2017-09-14 ENCOUNTER — Ambulatory Visit: Payer: Federal, State, Local not specified - PPO | Admitting: Internal Medicine

## 2017-09-14 DIAGNOSIS — D869 Sarcoidosis, unspecified: Secondary | ICD-10-CM | POA: Diagnosis not present

## 2017-09-14 DIAGNOSIS — J302 Other seasonal allergic rhinitis: Secondary | ICD-10-CM | POA: Diagnosis not present

## 2017-09-14 DIAGNOSIS — J3089 Other allergic rhinitis: Secondary | ICD-10-CM | POA: Diagnosis not present

## 2017-09-14 NOTE — Progress Notes (Signed)
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 History pleural effusion, complicated by DVT, allergic rhinitis, DM2 Office Spirometry 10/26/16-mild restriction of exhaled volume. FVC 1.62/75%, FEV1 1.28/77%, ratio 0.79, FEF 25-75% 1.24/82% ------------------------------------------------------------------------------------  10/26/16- 72 year old female former smoker previously followed for atypical/seronegative sarcoid with bilateral lower zone lung volume loss, peripheral neuropathy. History pleural effusion, DVT, allergic rhinitis, DM2 FOLLOWS FOR: Increased SOB and wheezing through the winter and used Rescue inhaler more than usual. Also, with increase in pollen at this time-wheezing and SOB  has come back-using rescue inhaler again. Has used rescue inhaler 2 or 3 times a week through the winter and spring with no major exacerbations. She denies fever, palpitation, chest pain or sputum. Does still get heavy sweats "worse than hot flashes"-chronic. Not progressive. Most often happens at night. Generally feels well. Office Spirometry 10/26/16-mild restriction of exhaled volume. FVC 1.62/75%, FEV1 1.28/77%, ratio 0.79, FEF 25-75% 1.24/82%  09/14/17- 72 year old female former smoker previously followed for atypical/seronegative sarcoid with bilateral lower zone lung volume loss, peripheral neuropathy. History pleural effusion, DVT, allergic rhinitis, DM2 Treated here with Z-Pak and prednisone taper for bronchitis exacerbation in January. ----Sarcoidosis; Pt states she was doing well with breathing but is having sinus issues-allergies.  Proair hfa,  Chest is "fine, clear".  Using rescue inhaler 2 or 3 times a week.  For 3 days has had nasal congestion with blowing and stuffiness, not purulent and no headache or fever.  Using Chlor-Trimeton which does not make her sleepy. CXR 07/17/17 No active cardiopulmonary  disease. [place]  ROS-see HPI   + = positive Constitutional:   No-   weight loss, +night sweats, fevers, chills, fatigue, lassitude. HEENT:   No-  headaches, difficulty swallowing, tooth/dental problems, sore throat,       No-  sneezing, itching, ear ache, +nasal congestion, post nasal drip,  CV:  No-   chest pain, orthopnea, PND, swelling in lower extremities, anasarca,                                                                              dizziness, palpitations Resp: +shortness of breath with exertion or at rest.              No-   productive cough,  No non-productive cough,  No- coughing up of blood.              No-   change in color of mucus.  No- wheezing.   Skin: No-   rash or lesions. GI:  No-   heartburn, indigestion, abdominal pain, nausea, vomiting,  GU: . MS:  No-   joint pain or swelling.  . Neuro-     nothing unusual Psych:  No- change in mood or affect. No depression or anxiety.  No memory loss.  OBJ- Physical Exam      General- Alert, Oriented, Affect-appropriate, Distress- none acute. +Looks well. Skin- rash-none, lesions- none, excoriation- none Lymphadenopathy- none Head- atraumatic            Eyes- Gross vision intact, PERRLA, conjunctivae and secretions clear            Ears- Hearing, canals-normal  Nose- Clear, no-Septal dev, mucus, polyps, erosion, perforation             Throat- Mallampati II , mucosa clear , drainage- none, tonsils- atrophic Neck- flexible , trachea midline, no stridor , thyroid nl, carotid no bruit Chest - symmetrical excursion , unlabored           Heart/CV- RRR , no murmur , no gallop  , no rub, nl s1 s2                           - JVD- none , edema- none, stasis changes- none, varices- none           Lung- , + decreased in bases, wheeze- none, cough- none ,   dullness-none, rub- none + trace crackle right                     base           Chest wall-  Abd-  Br/ Gen/ Rectal- Not done, not indicated Extrem-  cyanosis- none, clubbing, none, atrophy- none, strength- nl Neuro- grossly intact to observation

## 2017-09-14 NOTE — Patient Instructions (Signed)
Ok to use the ProAir albuterol rescue inhaler if needed  Chlortrimeton or one of the newer, non-sedating antihistamines like Claritin/ loratadine is fine for drainage and sneezing.  If needed, you can add regular use of otc Flonase (fluticasone) through the pollen season.  Usually 2 puffs in each nostril once daily at bedtime works pretty well.  Please call if we can help

## 2017-09-15 NOTE — Assessment & Plan Note (Signed)
Early viral versus allergic rhinitis. Recommended Sudafed, Flonase.  Chlor-Trimeton okay if it does not cause drowsiness.

## 2017-09-15 NOTE — Assessment & Plan Note (Signed)
Presumptive diagnosis was always burned out seronegative sarcoid.  She is not describing symptoms of an active pulmonary process now. Plan- ACE level

## 2017-09-19 ENCOUNTER — Other Ambulatory Visit: Payer: Self-pay | Admitting: Internal Medicine

## 2017-09-23 ENCOUNTER — Other Ambulatory Visit: Payer: Self-pay | Admitting: Internal Medicine

## 2017-09-25 ENCOUNTER — Other Ambulatory Visit: Payer: Self-pay | Admitting: Internal Medicine

## 2017-09-25 ENCOUNTER — Encounter: Payer: Self-pay | Admitting: Internal Medicine

## 2017-09-25 MED ORDER — ALPRAZOLAM 0.5 MG PO TABS: 0.2500 mg | ORAL_TABLET | Freq: Every day | ORAL | 0 refills | Status: DC | PRN

## 2017-09-25 NOTE — Addendum Note (Signed)
Addended by: Pricilla Holm A on: 09/25/2017 04:38 PM   Modules accepted: Orders

## 2017-11-14 ENCOUNTER — Other Ambulatory Visit: Payer: Self-pay

## 2017-11-14 MED ORDER — ATORVASTATIN CALCIUM 40 MG PO TABS: 40.0000 mg | ORAL_TABLET | Freq: Every day | ORAL | 3 refills | Status: DC

## 2018-01-23 LAB — HM MAMMOGRAPHY

## 2018-01-28 ENCOUNTER — Encounter: Payer: Self-pay | Admitting: Internal Medicine

## 2018-01-28 NOTE — Progress Notes (Signed)
Abstracted and sent to scan  

## 2018-02-12 ENCOUNTER — Ambulatory Visit: Payer: Federal, State, Local not specified - PPO | Attending: Internal Medicine | Admitting: Audiology

## 2018-02-12 DIAGNOSIS — R292 Abnormal reflex: Secondary | ICD-10-CM | POA: Diagnosis not present

## 2018-02-12 DIAGNOSIS — Z0110 Encounter for examination of ears and hearing without abnormal findings: Secondary | ICD-10-CM | POA: Diagnosis present

## 2018-02-12 DIAGNOSIS — Z789 Other specified health status: Secondary | ICD-10-CM | POA: Insufficient documentation

## 2018-02-12 NOTE — Procedures (Signed)
  [facility] [place]  [place], [place] [address]  [phone]   Audiological Evaluation  Patient Name: Brooke [name]   Status: Outpatient   DOB: [birth year]    Diagnosis: Hearing Loss             MRN: [id number] Date:  02/12/2018     Referent: Hoyt Koch, MD  History: [name] was seen for an audiological evaluation. Primary Concern: PCP referral due to age; no notable concerns otherwise  Pain: None History of hearing problems:N History of ear infections:  Y (only once 5 years ago in the left ear; prescribed drops) History of ear surgery or "tubes" : N History of dizziness/vertigo:   N History of balance issues:  (normally no, but did note some imbalance due to autoimmune disorder)  Tinnitus: N Sound sensitivity: N History of occupational noise exposure: N History of hypertension: Y - controlled with medication History of diabetes:  Y - controlled with medication Family history of hearing loss:  N Other concerns: N   Evaluation: Conventional pure tone audiometry from 250Hz  - 8000Hz  with using insert earphones.  Hearing Thresholds 5-15 dB HL in the right ear and 10-20 dB HL in the left ear from 250 to 8000 Hz. Reliability is good Speech reception levels (repeating words near threshold) using recorded spondee word lists:  Right ear: 5 dBHL.  Left ear: 10 dBHL Word recognition (at comfortably loud volumes) using recorded NU-6 word lists at 45 dBHL, in quiet.  Right ear: 100 %.  Left ear:   95 % Tympanometry (middle ear function) with ipsilateral acoustic reflexes.  Right ear: Normal (Type A) with 80-85 DB acoustic reflexes from 500 to 4000 Hz.  Left ear: Normal (Type A) with 95-100 dB acoustic reflexes from 500 to 4000 Hz.  Otoscopic inspection within normal limits bilaterally except for a small white debris near or on the left tympanic membrane. Slight non-occluding ear wax bilaterally without  redness.  CONCLUSION:      Reisha normal hearing thresholds and word recognition and normal conversational speech levels in each ear.  Middle ear volume pressure and compliance is within normal limits in each ear without redness on otoscopic examination.  Acoustic reflexes are within normal limits on the right side but are slightly elevated on the left side.  Please note that upon otoscopic examination the left ear also had a small whitish patch or piece of debris near or on the tympanic membrane.  Monitoring of hearing with an otoscopic examination in 6 to 12 months is recommended.The test results were discussed and [name] counseled.   RECOMMENDATIONS: 1.   Monitor the left ear with an otoscopic examination and a repeat hearing test in 1 year because of the slightly elevated acoustic reflexes on the left side and the small white patch on or near the left  tympanic membrane.  This evaluation may be completed at the physician's office or rescheduled here.  Please schedule an earlier evaluation for hearing concerns.   [name] L. Heide Spark Au.D., CCC-A Doctor of Audiology 02/12/2018   cc: Hoyt Koch, MD

## 2018-03-19 ENCOUNTER — Other Ambulatory Visit: Payer: Self-pay | Admitting: Internal Medicine

## 2018-04-30 ENCOUNTER — Other Ambulatory Visit: Payer: Self-pay | Admitting: Internal Medicine

## 2018-05-07 ENCOUNTER — Ambulatory Visit: Payer: Federal, State, Local not specified - PPO | Admitting: Internal Medicine

## 2018-05-08 ENCOUNTER — Encounter: Payer: Self-pay | Admitting: Internal Medicine

## 2018-05-08 ENCOUNTER — Other Ambulatory Visit (INDEPENDENT_AMBULATORY_CARE_PROVIDER_SITE_OTHER): Payer: Federal, State, Local not specified - PPO

## 2018-05-08 ENCOUNTER — Ambulatory Visit: Payer: Federal, State, Local not specified - PPO | Admitting: Internal Medicine

## 2018-05-08 VITALS — BP 110/80 | HR 73 | Temp 98.3°F | Ht 63.0 in | Wt 144.0 lb

## 2018-05-08 DIAGNOSIS — H42 Glaucoma in diseases classified elsewhere: Secondary | ICD-10-CM

## 2018-05-08 DIAGNOSIS — E1139 Type 2 diabetes mellitus with other diabetic ophthalmic complication: Secondary | ICD-10-CM | POA: Diagnosis not present

## 2018-05-08 DIAGNOSIS — E1169 Type 2 diabetes mellitus with other specified complication: Secondary | ICD-10-CM | POA: Diagnosis not present

## 2018-05-08 DIAGNOSIS — E785 Hyperlipidemia, unspecified: Secondary | ICD-10-CM | POA: Diagnosis not present

## 2018-05-08 LAB — LIPID PANEL
CHOL/HDL RATIO: 4
Cholesterol: 156 mg/dL (ref 0–200)
HDL: 40.6 mg/dL (ref >39.00)
LDL Cholesterol: 84 mg/dL (ref 0–99)
NonHDL: 115.15
TRIGLYCERIDES: 154 mg/dL — AB (ref 0.0–149.0)
VLDL: 30.8 mg/dL (ref 0.0–40.0)

## 2018-05-08 LAB — HEMOGLOBIN A1C: Hgb A1c MFr Bld: 11.5 % — ABNORMAL HIGH (ref 4.6–6.5)

## 2018-05-08 NOTE — Progress Notes (Signed)
   Subjective:    Patient ID: Brooke [name], female    DOB: September 14, 1945, 72 y.o.   MRN: [id number]  HPI The patient is a 72 YO female coming in for follow up of her poorly controlled diabetes. She was prescribed jardiance at last visit but she has not gotten that yet. She could not afford it or needed a PA or something and did not let us know. Denies new thirst or urination. She does have some burning pains in her feet rarely. She does have eye specialist and is following with them. She feels that she has taken some more care with her diet in the last 7 months since last visit. Denies more exercise. She is taking kombiglyze and amaryl daily but her insurance is not going to cover kombiglyze next year and they recommended she have a generic for cost savings.   Review of Systems  Constitutional: Negative.   HENT: Negative.   Eyes: Negative.   Respiratory: Negative for cough, chest tightness and shortness of breath.   Cardiovascular: Negative for chest pain, palpitations and leg swelling.  Gastrointestinal: Negative for abdominal distention, abdominal pain, constipation, diarrhea, nausea and vomiting.  Musculoskeletal: Negative.   Skin: Negative.   Neurological: Negative.        Burning pains  Psychiatric/Behavioral: Negative.       Objective:   Physical Exam  Constitutional: She is oriented to person, place, and time. She appears well-developed and well-nourished.  HENT:  Head: Normocephalic and atraumatic.  Eyes: EOM are normal.  Neck: Normal range of motion.  Cardiovascular: Normal rate and regular rhythm.  Pulmonary/Chest: Effort normal and breath sounds normal. No respiratory distress. She has no wheezes. She has no rales.  Abdominal: Soft. Bowel sounds are normal. She exhibits no distension. There is no tenderness. There is no rebound.  Musculoskeletal: She exhibits no edema.  Neurological: She is alert and oriented to person, place, and time. Coordination normal.  Skin: Skin is  warm and dry.  Psychiatric: She has a normal mood and affect.   Vitals:   05/08/18 0939  BP: 110/80  Pulse: 73  Temp: 98.3 F (36.8 C)  TempSrc: Oral  SpO2: 99%  Weight: 144 lb (65.3 kg)  Height: 5\' 3"  (1.6 m)      Assessment & Plan:  Visit time 25 minutes: greater than 50% of that time was spent in face to face counseling and coordination of care with the patient: counseled about the serious nature of her medical conditions and the risk of diabetes and kidney failure, vision failure, amputation, death.

## 2018-05-08 NOTE — Patient Instructions (Signed)
We are checking the labs today.  

## 2018-05-09 ENCOUNTER — Other Ambulatory Visit: Payer: Self-pay | Admitting: Internal Medicine

## 2018-05-09 ENCOUNTER — Encounter: Payer: Self-pay | Admitting: Internal Medicine

## 2018-05-09 ENCOUNTER — Telehealth: Payer: Self-pay | Admitting: Internal Medicine

## 2018-05-09 MED ORDER — METFORMIN HCL 1000 MG PO TABS
1000.0000 mg | ORAL_TABLET | Freq: Two times a day (BID) | ORAL | 3 refills | Status: DC
Start: 2018-05-09 — End: 2019-02-11

## 2018-05-09 MED ORDER — INSULIN GLARGINE 100 UNITS/ML SOLOSTAR PEN: 10.0000 [IU] | PEN_INJECTOR | Freq: Every day | SUBCUTANEOUS | 11 refills | Status: DC

## 2018-05-09 MED ORDER — INSULIN GLARGINE 100 UNIT/ML SOLOSTAR PEN: 10.0000 [IU] | PEN_INJECTOR | Freq: Every day | SUBCUTANEOUS | 6 refills | Status: DC

## 2018-05-09 NOTE — Telephone Encounter (Signed)
Copied from [place] [id number]. Topic: General - Other >> May 09, 2018  3:08 PM Carolyn Stare wrote:   New RX  for the patient [facility] [address] call to say they received the RX  Insulin Glargine (LANTUS SOLOSTAR) 100 UNIT/ML Solostar Pen   but in order for the pt to use she will need the BD Pen Needles

## 2018-05-09 NOTE — Assessment & Plan Note (Signed)
Checking lipid panel as she has made some dietary changes since last visit and wants to recheck prior to considering cholesterol medicine.

## 2018-05-09 NOTE — Telephone Encounter (Signed)
[facility] on [address] calling to request Rx for BD pen needles to use with Insulin Glargine (Lantus Solostar) 100unit/mL.

## 2018-05-09 NOTE — Assessment & Plan Note (Signed)
She was not able to make the recommended changes since last visit, checking HgA1c and adjust as needed. Suspect that her control will be severely exacerbated as previous since no changes have been made in regimen. Weight is stable. She does have complications.

## 2018-05-09 NOTE — Telephone Encounter (Signed)
Copied from [place] [id number]. Topic: General - Other >> May 09, 2018  3:08 PM Carolyn Stare wrote:   New RX  for the patient [facility] [address] call to say they received the RX  Insulin Glargine (LANTUS SOLOSTAR) 100 UNIT/ML Solostar Pen   but in order for the pt to use she will need the BD Pen Needles

## 2018-05-10 MED ORDER — INSULIN PEN NEEDLE 32G X 4 MM MISC: 1 refills | Status: DC

## 2018-05-10 NOTE — Telephone Encounter (Signed)
Needles sent

## 2018-05-13 ENCOUNTER — Other Ambulatory Visit: Payer: Self-pay | Admitting: Internal Medicine

## 2018-05-13 MED ORDER — BLOOD GLUCOSE METER KIT: PACK | 0 refills | Status: AC

## 2018-05-31 IMAGING — DX DG CHEST 2V
2 series · 2 of 2 positions shown · non-contrast
Comparison: 08/17/2015

CLINICAL DATA: Pulmonary sarcoidosis.

EXAM:
CHEST  2 VIEW

[chest pa]
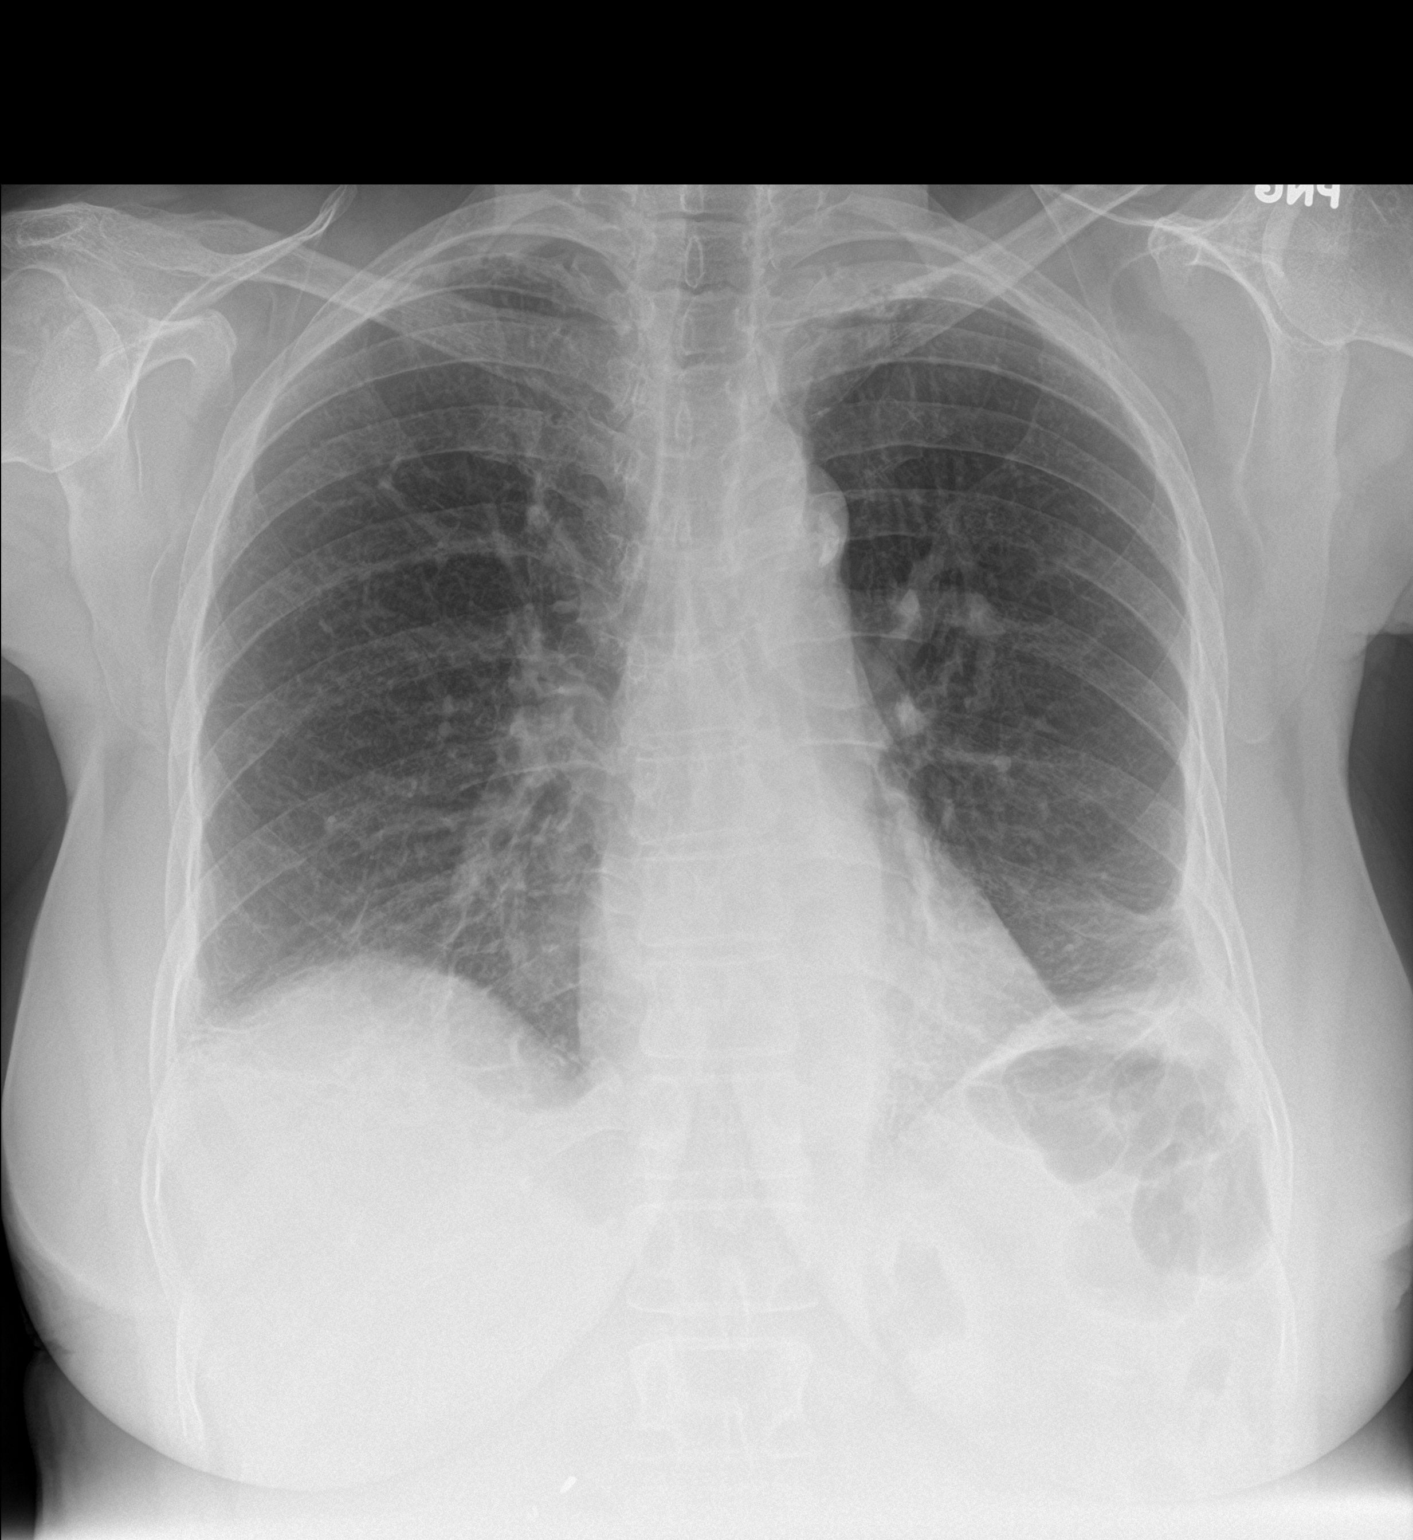

[chest lat]
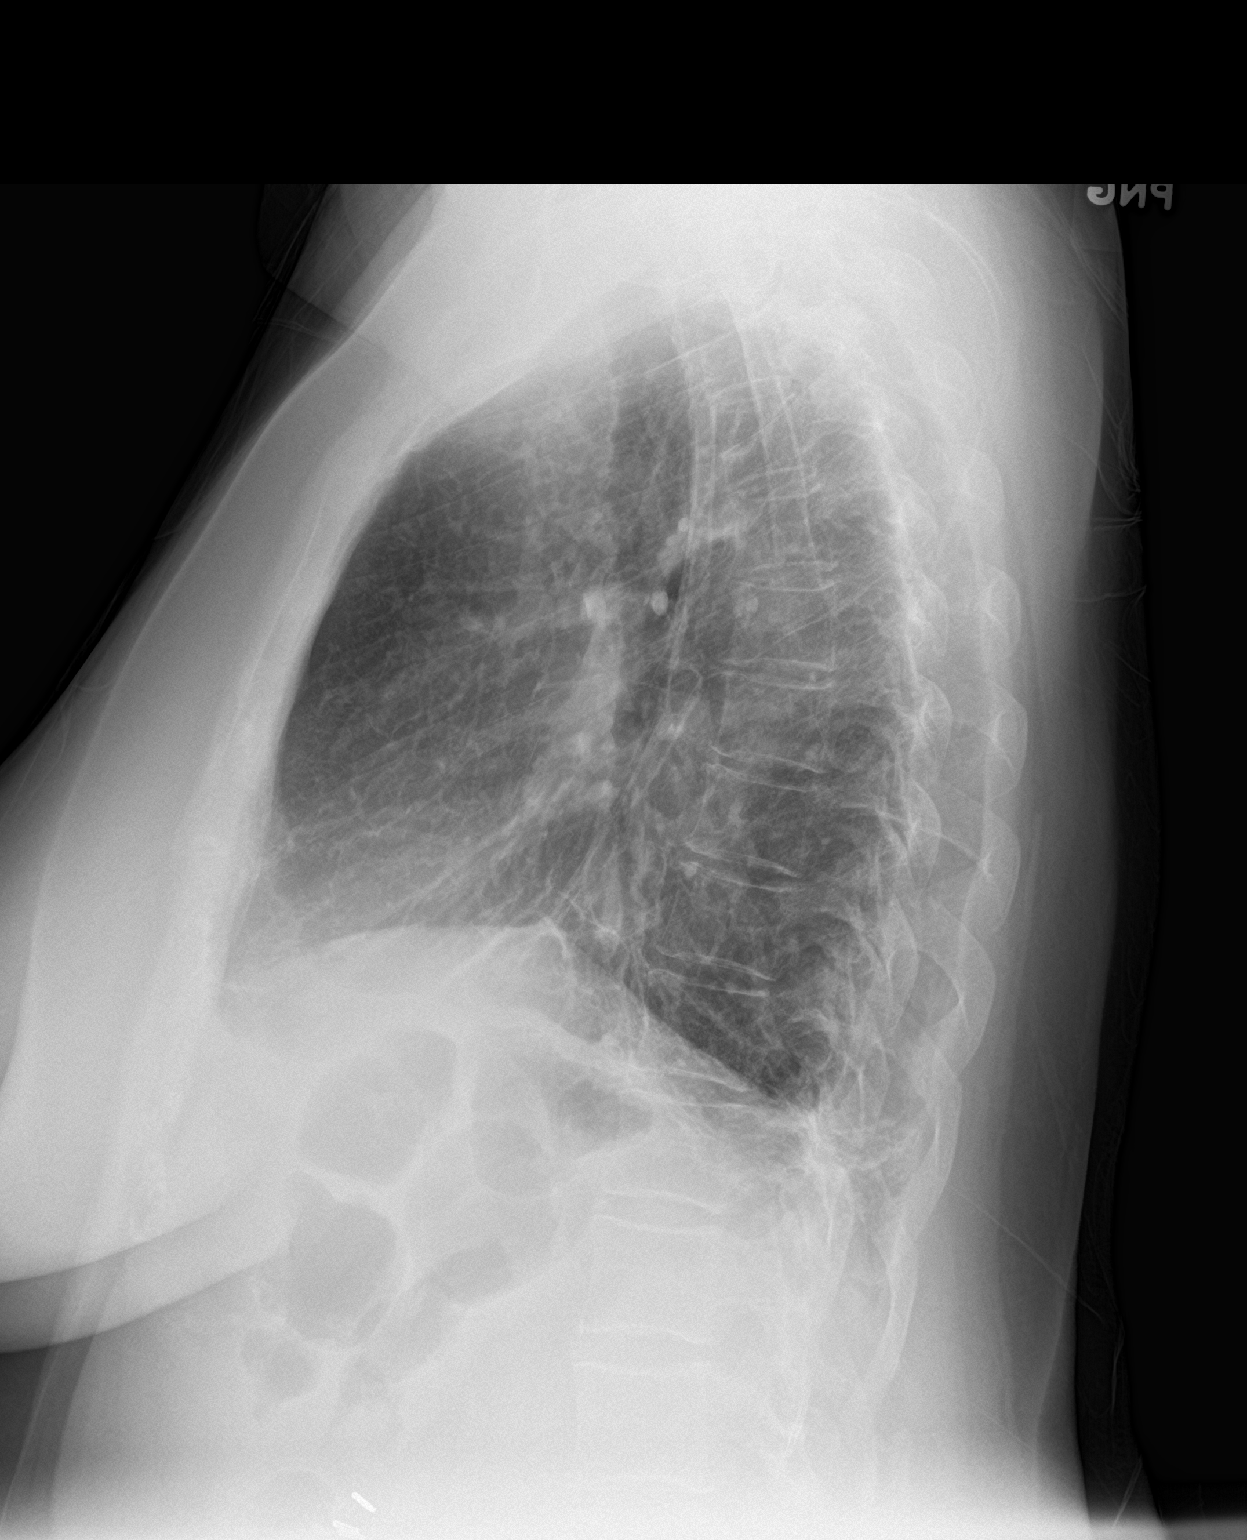

[2 of 2 positions shown; findings below may reference images not displayed]

FINDINGS: Cardiomediastinal silhouette is normal. Mediastinal contours appear
intact.

There is no evidence of focal airspace consolidation, pleural
effusion or pneumothorax. There is persistent chronic blunting of
the costophrenic angles, which given its chronicity likely
represents bibasilar scarring/pleural thickening.

Osseous structures are without acute abnormality. Soft tissues are
grossly normal.
IMPRESSION: Chronic scarring/ pleural thickening of bibasilar lung bases.

No evidence of acute findings.

## 2018-07-09 ENCOUNTER — Ambulatory Visit: Payer: Self-pay

## 2018-07-09 NOTE — Telephone Encounter (Signed)
Patient seeing burns tomorrow

## 2018-07-09 NOTE — Telephone Encounter (Addendum)
Patient called and says that she has been having an upset stomach the first week after starting on Metformin 1000 mg BID. She says she has bubbling and her stomach aches, like it did when she had an ulcer. She says she cut her Metformin in 1/2 and is taking 500 mg BID along with her Glimepiride. She says the last 4 days, she has taken Pepcid everyday and has gone through a large bottle of Pepto Bismol. She says she stopped taking the Metformin last night. She's asking if Dr. Sharlet Salina could change the Metformin to something else and call in something to soothe her stomach or does she need to wait to see Dr. Quay Burow tomorrow. This appointment was made because no opening with Dr. Sharlet Salina.  I advised I will send over to Dr. Sharlet Salina for her review and someone will call with her recommendation.  Reason for Disposition . Caller has NON-URGENT medication question about med that PCP prescribed and triager unable to answer question  Protocols used: MEDICATION QUESTION CALL-A-AH

## 2018-07-09 NOTE — Progress Notes (Signed)
Subjective:    Patient ID: Brooke [name], female    DOB: 10-06-1945, 73 y.o.   MRN: [id number]  HPI The patient is here for an acute visit.   Metformin is upsetting her stomach - it causes bubbling and stomach ache.  She started medication 6 weeks ago in addition to insulin and glimepiride.  After the first week she had to decrease metformin to 500 mg twice daily, which she is still taking.   The past several days her stomach feels like it did when she had an ulcer.  She has epigastric pain and nausea.  She denies any heartburn or reflux-just the bubbly and discomfort.  She is taking Pepcid daily and she has taken a large bottle of Pepto bismol.      Sugars in monring - 117, 130  And in evening less than 120-130. No sugar less than 100.      Medications and allergies reviewed with patient and updated if appropriate.  Patient Active Problem List   Diagnosis Date Noted  . Routine general medical examination at a health care facility 07/11/2016  . 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 Essential hypertension 07/13/2014  . Hyperlipidemia associated with type 2 diabetes mellitus ([place]) 07/13/2014  . SARCOIDOSIS, PULMONARY 07/18/2007  . 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 Seasonal and perennial allergic rhinitis 07/18/2007    Current Outpatient Medications on File Prior to Visit  Medication Sig Dispense Refill  . ALPRAZolam (XANAX) 0.5 MG tablet Take 0.5 tablets (0.25 mg total) by mouth daily as needed for anxiety. 30 tablet 0  . aspirin 81 MG tablet Take 81 mg by mouth daily.    Marland Kitchen atorvastatin (LIPITOR) 40 MG tablet Take 1 tablet (40 mg total) by mouth daily. 90 tablet 3  . blood glucose meter kit and supplies Dispense based on patient and insurance preference. Use up to four times daily as directed. (FOR ICD-10 E10.9, E11.9). 1 each 0  . FLUoxetine (PROZAC) 10 MG tablet Take 1 tablet (10 mg total) by mouth daily. 30 tablet  4  . glimepiride (AMARYL) 2 MG tablet TAKE 1 TABLET BY MOUTH ONCE DAILY WITH BREAKFAST 90 tablet 3  . Insulin Glargine (LANTUS SOLOSTAR) 100 UNIT/ML Solostar Pen Inject 10-25 Units into the skin daily. 5 pen 6  . Insulin Pen Needle 32G X 4 MM MISC Used to take insulin 100 each 1  . latanoprost (XALATAN) 0.005 % ophthalmic solution 1 drop in both eyes at bedtime  0  . losartan (COZAAR) 100 MG tablet TAKE 1 TABLET BY MOUTH ONCE DAILY 90 tablet 1  . metFORMIN (GLUCOPHAGE) 1000 MG tablet Take 1 tablet (1,000 mg total) by mouth 2 (two) times daily with a meal. 180 tablet 3  . ONE TOUCH ULTRA TEST test strip TEST FOUR TIMES DAILY AS DIRECTED 200 each 2  . PROAIR HFA 108 (90 Base) MCG/ACT inhaler inhale 2 puffs by mouth every 6 hours if needed 8.5 g 5   No current facility-administered medications on file prior to visit.     Past Medical History:  Diagnosis Date  . Allergic rhinitis   . Diabetes mellitus without complication ([place])   . DVT (deep venous thrombosis) ([place])   . Erosive gastritis with hemorrhage 08/09/2014  . Hypertension   . Peripheral neuropathy    uncertain relation to lung disease  . 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 Pleural effusion   . Sarcoidosis  Past Surgical History:  Procedure Laterality Date  . CHOLECYSTECTOMY  1975  . COLONOSCOPY    . ESOPHAGOGASTRODUODENOSCOPY    . ESOPHAGOGASTRODUODENOSCOPY N/A 08/09/2014   Procedure: ESOPHAGOGASTRODUODENOSCOPY (EGD);  Surgeon: Gatha Mayer, MD;  Location: [facility] ENDOSCOPY;  Service: Endoscopy;  Laterality: N/A;  . FOOT SURGERY  2013   left    Social History   Socioeconomic History  . Marital status: Single    Spouse name: Not on file  . Number of children: Not on file  . Years of education: Not on file  . Highest education level: Not on file  Occupational History  . Not on file  Social Needs  . Financial resource strain: Not on file  . Food insecurity:    Worry: Not on file    Inability: Not on file  .  Transportation needs:    Medical: Not on file    Non-medical: Not on file  Tobacco Use  . Smoking status: Former Smoker    Packs/day: 0.03    Years: 15.00    Pack years: 0.45    Types: Cigarettes    Last attempt to quit: 06/19/1982    Years since quitting: 36.0  . Smokeless tobacco: Never Used  Substance and Sexual Activity  . Alcohol use: No  . Drug use: No  . Sexual activity: Not on file  Lifestyle  . Physical activity:    Days per week: Not on file    Minutes per session: Not on file  . Stress: Not on file  Relationships  . Social connections:    Talks on phone: Not on file    Gets together: Not on file    Attends religious service: Not on file    Active member of club or organization: Not on file    Attends meetings of clubs or organizations: Not on file    Relationship status: Not on file  Other Topics Concern  . Not on file  Social History Narrative  . Not on file    Family History  Problem Relation Age of Onset  . Hypertension Mother   . Colon cancer Neg Hx     Review of Systems  Constitutional: Negative for chills and fever.  Respiratory: Negative for cough.   Cardiovascular: Negative for chest pain and palpitations.  Gastrointestinal: Positive for abdominal pain (epigastric pain) and nausea. Negative for blood in stool (no black stool), constipation and diarrhea.       Bubbling in intestines, no gerd  Neurological: Negative for light-headedness and headaches.       Objective:   Vitals:   07/10/18 1009  BP: 136/80  Pulse: 75  Resp: 16  Temp: 97.8 F (36.6 C)  SpO2: 99%   BP Readings from Last 3 Encounters:  07/10/18 136/80  05/08/18 110/80  09/14/17 114/70   Wt Readings from Last 3 Encounters:  07/10/18 142 lb (64.4 kg)  05/08/18 144 lb (65.3 kg)  09/14/17 148 lb 3.2 oz (67.2 kg)   Body mass index is 25.15 kg/m.   Physical Exam Constitutional:      General: She is not in acute distress.    Appearance: Normal appearance. She is not  ill-appearing.  HENT:     Head: Normocephalic and atraumatic.  Abdominal:     General: Bowel sounds are normal. There is no distension.     Palpations: Abdomen is soft. There is no mass.     Tenderness: There is abdominal tenderness (Mild in epigastric region-she describes more of a  discomfort). There is no guarding or rebound.     Hernia: No hernia is present.  Skin:    General: Skin is warm and dry.  Neurological:     Mental Status: She is alert.            Assessment & Plan:    See Problem List for Assessment and Plan of chronic medical problems.

## 2018-07-09 NOTE — Telephone Encounter (Signed)
Given how high her HgA1c is she needs visit for medication management if she cannot take metformin.

## 2018-07-09 NOTE — Telephone Encounter (Signed)
Patient states that she started taking the medication when she was supposed to and she did cut back to one pill daily and it was still messing with her after a month did try to go back to two pills daily and the symptoms still have not gone away. Patient states that she has been looking into see how she could be able to get jardiance approved since the metformin is not working told her I would see if appropriate. Maybe I can do an appeal if appropriate.

## 2018-07-09 NOTE — Telephone Encounter (Signed)
Did she just start metformin? She was supposed to start this in November? This is a very common side effect with metformin and generally goes away within the first 2-3 weeks of taking the medication and I would recommend to start back metformin daily for 1 week then increase to 1 pill twice a day and stay at that dosage. If unwilling needs visit to discuss alternate diabetes management.

## 2018-07-09 NOTE — Telephone Encounter (Deleted)
  Reason for Disposition . Caller has NON-URGENT medication question about med that PCP prescribed and triager unable to answer question  Protocols used: MEDICATION QUESTION CALL-A-AH  

## 2018-07-10 ENCOUNTER — Ambulatory Visit: Payer: Federal, State, Local not specified - PPO | Admitting: Internal Medicine

## 2018-07-10 ENCOUNTER — Encounter: Payer: Self-pay | Admitting: Internal Medicine

## 2018-07-10 ENCOUNTER — Other Ambulatory Visit (INDEPENDENT_AMBULATORY_CARE_PROVIDER_SITE_OTHER): Payer: Federal, State, Local not specified - PPO

## 2018-07-10 VITALS — BP 136/80 | HR 75 | Temp 97.8°F | Resp 16 | Ht 63.0 in | Wt 142.0 lb

## 2018-07-10 DIAGNOSIS — H42 Glaucoma in diseases classified elsewhere: Secondary | ICD-10-CM

## 2018-07-10 DIAGNOSIS — E1139 Type 2 diabetes mellitus with other diabetic ophthalmic complication: Secondary | ICD-10-CM

## 2018-07-10 LAB — COMPREHENSIVE METABOLIC PANEL
ALBUMIN: 4.2 g/dL (ref 3.5–5.2)
ALK PHOS: 91 U/L (ref 39–117)
ALT: 11 U/L (ref 0–35)
AST: 13 U/L (ref 0–37)
BILIRUBIN TOTAL: 0.3 mg/dL (ref 0.2–1.2)
BUN: 16 mg/dL (ref 6–23)
CO2: 31 mEq/L (ref 19–32)
CREATININE: 0.68 mg/dL (ref 0.40–1.20)
Calcium: 9.7 mg/dL (ref 8.4–10.5)
Chloride: 101 mEq/L (ref 96–112)
GFR: 102.09 mL/min (ref >60.00)
GLUCOSE: 193 mg/dL — AB (ref 70–99)
Potassium: 4.6 mEq/L (ref 3.5–5.1)
SODIUM: 137 meq/L (ref 135–145)
TOTAL PROTEIN: 7.1 g/dL (ref 6.0–8.3)

## 2018-07-10 LAB — HEMOGLOBIN A1C: HEMOGLOBIN A1C: 8.9 % — AB (ref 4.6–6.5)

## 2018-07-10 MED ORDER — DAPAGLIFLOZIN PROPANEDIOL 5 MG PO TABS: 5.0000 mg | ORAL_TABLET | Freq: Every day | ORAL | 5 refills | Status: DC

## 2018-07-10 NOTE — Assessment & Plan Note (Signed)
She feels her sugars were so high elevated last year because her husband had died and she was eating poorly and much more than she should She is taking all her medications, but not tolerating metformin and she did decrease the dose Discontinue metformin Continue glimepiride and insulin at current doses [place] looks like it is covered-start 5 mg daily-can increase Will check CMP, A1c Continue to monitor sugars at home Follow-up with Dr. Sharlet Salina regarding further adjustment

## 2018-07-10 NOTE — Patient Instructions (Addendum)
Stop the metformin.  If you stomach symptoms do not go away let us know.   After a couple of days start the [place].    Continue your other medications.    Have blood work done today.

## 2018-07-22 ENCOUNTER — Other Ambulatory Visit: Payer: Self-pay | Admitting: Internal Medicine

## 2018-07-22 NOTE — Telephone Encounter (Signed)
Control database checked last refill: 09/25/2017 LOV: 07/10/2018, cpe 08/29/2017 NOV: none

## 2018-08-02 ENCOUNTER — Other Ambulatory Visit: Payer: Self-pay | Admitting: Internal Medicine

## 2018-09-07 ENCOUNTER — Other Ambulatory Visit: Payer: Self-pay | Admitting: Internal Medicine

## 2018-09-19 ENCOUNTER — Ambulatory Visit: Payer: Federal, State, Local not specified - PPO | Admitting: Internal Medicine

## 2018-10-08 ENCOUNTER — Other Ambulatory Visit: Payer: Self-pay | Admitting: Internal Medicine

## 2018-10-31 ENCOUNTER — Other Ambulatory Visit: Payer: Self-pay | Admitting: Internal Medicine

## 2018-11-02 ENCOUNTER — Other Ambulatory Visit: Payer: Self-pay | Admitting: Internal Medicine

## 2018-11-04 ENCOUNTER — Other Ambulatory Visit: Payer: Self-pay | Admitting: Internal Medicine

## 2018-12-03 ENCOUNTER — Other Ambulatory Visit: Payer: Self-pay | Admitting: Internal Medicine

## 2018-12-23 ENCOUNTER — Ambulatory Visit: Payer: Federal, State, Local not specified - PPO | Admitting: Internal Medicine

## 2018-12-28 ENCOUNTER — Other Ambulatory Visit: Payer: Self-pay | Admitting: Internal Medicine

## 2019-01-21 ENCOUNTER — Ambulatory Visit: Payer: Federal, State, Local not specified - PPO | Admitting: Internal Medicine

## 2019-01-21 ENCOUNTER — Encounter: Payer: Self-pay | Admitting: Internal Medicine

## 2019-01-21 ENCOUNTER — Other Ambulatory Visit: Payer: Self-pay

## 2019-01-21 VITALS — BP 122/68 | HR 76 | Temp 98.0°F | Ht 63.0 in | Wt 146.6 lb

## 2019-01-21 DIAGNOSIS — R0609 Other forms of dyspnea: Secondary | ICD-10-CM | POA: Diagnosis not present

## 2019-01-21 DIAGNOSIS — D869 Sarcoidosis, unspecified: Secondary | ICD-10-CM

## 2019-01-21 MED ORDER — BREO ELLIPTA 100-25 MCG/INH IN AEPB: 1.0000 | INHALATION_SPRAY | Freq: Every day | RESPIRATORY_TRACT | 0 refills | Status: DC

## 2019-01-21 MED ORDER — ALBUTEROL SULFATE HFA 108 (90 BASE) MCG/ACT IN AERS: INHALATION_SPRAY | RESPIRATORY_TRACT | 4 refills | Status: DC

## 2019-01-21 NOTE — Patient Instructions (Signed)
Order- schedule PFT    Dx Dyspnea on exertion  Sample Breo 100    Inhale 1 puff then rinse mouth, once daily   See if using this for a few days helps the shortness of breath with exercsie  Refill sent for albuterol rescue inhaler

## 2019-01-21 NOTE — Progress Notes (Signed)
Patient seen in the office today and instructed on use of Breo 100.  Patient expressed understanding and demonstrated technique. 

## 2019-01-21 NOTE — Progress Notes (Signed)
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 History pleural effusion, complicated by DVT, allergic rhinitis, DM2 Office Spirometry 10/26/16-mild restriction of exhaled volume. 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 History pleural effusion, DVT, allergic rhinitis, DM2 Treated here with Z-Pak and prednisone taper for bronchitis exacerbation in January. ----Sarcoidosis; Pt states she was doing well with breathing but is having sinus issues-allergies.  Proair hfa,  Chest is "fine, clear".  Using rescue inhaler 2 or 3 times a week.  For 3 days has had nasal congestion with blowing and stuffiness, not purulent and no headache or fever.  Using Chlor-Trimeton which does not make her sleepy. CXR 07/17/17 No active cardiopulmonary disease. 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 History pleural effusion, DVT, allergic rhinitis, DM2 Glaucoma,  ProAir hfa ----- pt states breathing varies and is at baseline More DOE several months, unsure if seasonal. No acute infection. Lost sense of taste and smell several years ago, unrelated to Covid. She is being Covid careful- discussed. Rescue inhaler 1x/ day.   // Consider full PFT  ROS-see HPI   + = positive Constitutional:   No-   weight loss, +night sweats, fevers, chills, fatigue, lassitude. HEENT:   No-  headaches, difficulty swallowing, tooth/dental problems, sore throat,       No-  sneezing, itching, ear ache, +nasal congestion, post nasal drip,  CV:  No-    chest pain, orthopnea, PND, swelling in lower extremities, anasarca,                                                                              dizziness, palpitations Resp: +shortness of breath with exertion or at rest.              No-   productive cough,  No non-productive cough,  No- coughing up of blood.              No-   change in color of mucus.  No- wheezing.   Skin: No-   rash or lesions. GI:  No-   heartburn, indigestion, abdominal pain, nausea, vomiting,  GU: . MS:  No-   joint pain or swelling.  . Neuro-     nothing unusual Psych:  No- change in mood or affect. No depression or anxiety.  No memory loss.  OBJ- Physical Exam      General- Alert, Oriented, Affect-appropriate, Distress- none acute. +Looks well. Skin- rash-none, lesions- none, excoriation- none Lymphadenopathy- none Head- atraumatic            Eyes- Gross vision intact, PERRLA, conjunctivae and secretions clear            Ears- Hearing, canals-normal            Nose- Clear, no-Septal dev, mucus, polyps, erosion, perforation             Throat- Mallampati II , mucosa clear , drainage- none, tonsils- atrophic Neck-  flexible , trachea midline, no stridor , thyroid nl, carotid no bruit Chest - symmetrical excursion , unlabored           Heart/CV- RRR , no murmur , no gallop  , no rub, nl s1 s2                           - JVD- none , edema- none, stasis changes- none, varices- none           Lung-, Clear/+ decreased in bases, wheeze- none, cough- none ,   dullness-none, rub- none            Chest wall-  Abd-  Br/ Gen/ Rectal- Not done, not indicated Extrem- cyanosis- none, clubbing, none, atrophy- none, strength- nl Neuro- grossly intact to observation

## 2019-01-29 ENCOUNTER — Other Ambulatory Visit: Payer: Self-pay | Admitting: Internal Medicine

## 2019-02-11 ENCOUNTER — Encounter: Payer: Self-pay | Admitting: Internal Medicine

## 2019-02-11 ENCOUNTER — Ambulatory Visit (INDEPENDENT_AMBULATORY_CARE_PROVIDER_SITE_OTHER): Payer: Federal, State, Local not specified - PPO | Admitting: Internal Medicine

## 2019-02-11 ENCOUNTER — Other Ambulatory Visit: Payer: Self-pay

## 2019-02-11 ENCOUNTER — Other Ambulatory Visit (INDEPENDENT_AMBULATORY_CARE_PROVIDER_SITE_OTHER): Payer: Federal, State, Local not specified - PPO

## 2019-02-11 VITALS — BP 132/90 | HR 71 | Temp 98.9°F | Ht 63.0 in | Wt 147.0 lb

## 2019-02-11 DIAGNOSIS — E1139 Type 2 diabetes mellitus with other diabetic ophthalmic complication: Secondary | ICD-10-CM

## 2019-02-11 DIAGNOSIS — H42 Glaucoma in diseases classified elsewhere: Secondary | ICD-10-CM

## 2019-02-11 DIAGNOSIS — Z23 Encounter for immunization: Secondary | ICD-10-CM

## 2019-02-11 DIAGNOSIS — I10 Essential (primary) hypertension: Secondary | ICD-10-CM

## 2019-02-11 DIAGNOSIS — E1169 Type 2 diabetes mellitus with other specified complication: Secondary | ICD-10-CM | POA: Diagnosis not present

## 2019-02-11 DIAGNOSIS — E785 Hyperlipidemia, unspecified: Secondary | ICD-10-CM

## 2019-02-11 LAB — COMPREHENSIVE METABOLIC PANEL
ALT: 12 U/L (ref 0–35)
AST: 14 U/L (ref 0–37)
Albumin: 4.7 g/dL (ref 3.5–5.2)
Alkaline Phosphatase: 84 U/L (ref 39–117)
BUN: 16 mg/dL (ref 6–23)
CO2: 28 mEq/L (ref 19–32)
Calcium: 9.6 mg/dL (ref 8.4–10.5)
Chloride: 99 mEq/L (ref 96–112)
Creatinine, Ser: 0.88 mg/dL (ref 0.40–1.20)
GFR: 75.7 mL/min (ref >60.00)
Glucose, Bld: 90 mg/dL (ref 70–99)
Potassium: 4.4 mEq/L (ref 3.5–5.1)
Sodium: 137 mEq/L (ref 135–145)
Total Bilirubin: 0.4 mg/dL (ref 0.2–1.2)
Total Protein: 7.5 g/dL (ref 6.0–8.3)

## 2019-02-11 LAB — LIPID PANEL
Cholesterol: 196 mg/dL (ref 0–200)
HDL: 56 mg/dL (ref >39.00)
LDL Cholesterol: 119 mg/dL — ABNORMAL HIGH (ref 0–99)
NonHDL: 139.98
Total CHOL/HDL Ratio: 3
Triglycerides: 106 mg/dL (ref 0.0–149.0)
VLDL: 21.2 mg/dL (ref 0.0–40.0)

## 2019-02-11 LAB — CBC
HCT: 42.1 % (ref 36.0–46.0)
Hemoglobin: 13.9 g/dL (ref 12.0–15.0)
MCHC: 32.9 g/dL (ref 30.0–36.0)
MCV: 90 fl (ref 78.0–100.0)
Platelets: 326 10*3/uL (ref 150.0–400.0)
RBC: 4.68 Mil/uL (ref 3.87–5.11)
RDW: 14 % (ref 11.5–15.5)
WBC: 5.5 10*3/uL (ref 4.0–10.5)

## 2019-02-11 LAB — TSH: TSH: 2.8 u[IU]/mL (ref 0.35–4.50)

## 2019-02-11 LAB — HEMOGLOBIN A1C: Hgb A1c MFr Bld: 8.3 % — ABNORMAL HIGH (ref 4.6–6.5)

## 2019-02-11 LAB — VITAMIN B12: Vitamin B-12: 339 pg/mL (ref 211–911)

## 2019-02-11 NOTE — Patient Instructions (Signed)
Diabetes Mellitus and Standards of Medical Care Managing diabetes (diabetes mellitus) can be complicated. Your diabetes treatment may be managed by a team of health care providers, including:  A physician who specializes in diabetes (endocrinologist).  A nurse practitioner or physician assistant.  Nurses.  A diet and nutrition specialist (registered dietitian).  A certified diabetes educator (CDE).  An exercise specialist.  A pharmacist.  An eye doctor.  A foot specialist (podiatrist).  A dentist.  A primary care provider.  A mental health provider. Your health care providers follow guidelines to help you get the best quality of care. The following schedule is a general guideline for your diabetes management plan. Your health care providers may give you more specific instructions. Physical exams Upon being diagnosed with diabetes mellitus, and each year after that, your health care provider will ask about your medical and family history. He or she will also do a physical exam. Your exam may include:  Measuring your height, weight, and body mass index (BMI).  Checking your blood pressure. This will be done at every routine medical visit. Your target blood pressure may vary depending on your medical conditions, your age, and other factors.  Thyroid gland exam.  Skin exam.  Screening for damage to your nerves (peripheral neuropathy). This may include checking the pulse in your legs and feet and checking the level of sensation in your hands and feet.  A complete foot exam to inspect the structure and skin of your feet, including checking for cuts, bruises, redness, blisters, sores, or other problems.  Screening for blood vessel (vascular) problems, which may include checking the pulse in your legs and feet and checking your temperature. Blood tests Depending on your treatment plan and your personal needs, you may have the following tests done:  HbA1c (hemoglobin A1c). This  test provides information about blood sugar (glucose) control over the previous 2-3 months. It is used to adjust your treatment plan, if needed. This test will be done: ? At least 2 times a year, if you are meeting your treatment goals. ? 4 times a year, if you are not meeting your treatment goals or if treatment goals have changed.  Lipid testing, including total, LDL, and HDL cholesterol and triglyceride levels. ? The goal for LDL is less than 100 mg/dL (5.5 mmol/L). If you are at high risk for complications, the goal is less than 70 mg/dL (3.9 mmol/L). ? The goal for HDL is 40 mg/dL (2.2 mmol/L) or higher for men and 50 mg/dL (2.8 mmol/L) or higher for women. An HDL cholesterol of 60 mg/dL (3.3 mmol/L) or higher gives some protection against heart disease. ? The goal for triglycerides is less than 150 mg/dL (8.3 mmol/L).  Liver function tests.  Kidney function tests.  Thyroid function tests. Dental and eye exams  Visit your dentist two times a year.  If you have type 1 diabetes, your health care provider may recommend an eye exam 3-5 years after you are diagnosed, and then once a year after your first exam. ? For children with type 1 diabetes, a health care provider may recommend an eye exam when your child is age 10 or older and has had diabetes for 3-5 years. After the first exam, your child should get an eye exam once a year.  If you have type 2 diabetes, your health care provider may recommend an eye exam as soon as you are diagnosed, and then once a year after your first exam. Immunizations   The  yearly flu (influenza) vaccine is recommended for everyone 6 months or older who has diabetes.  The pneumonia (pneumococcal) vaccine is recommended for everyone 2 years or older who has diabetes. If you are 65 or older, you may get the pneumonia vaccine as a series of two separate shots.  The hepatitis B vaccine is recommended for adults shortly after being diagnosed with diabetes.   Adults and children with diabetes should receive all other vaccines according to age-specific recommendations from the Centers for Disease Control and Prevention (CDC). Mental and emotional health Screening for symptoms of eating disorders, anxiety, and depression is recommended at the time of diagnosis and afterward as needed. If your screening shows that you have symptoms (positive screening result), you may need more evaluation and you may work with a mental health care provider. Treatment plan Your treatment plan will be reviewed at every medical visit. You and your health care provider will discuss:  How you are taking your medicines, including insulin.  Any side effects you are experiencing.  Your blood glucose target goals.  The frequency of your blood glucose monitoring.  Lifestyle habits, such as activity level as well as tobacco, alcohol, and substance use. Diabetes self-management education Your health care provider will assess how well you are monitoring your blood glucose levels and whether you are taking your insulin correctly. He or she may refer you to:  A certified diabetes educator to manage your diabetes throughout your life, starting at diagnosis.  A registered dietitian who can create or review your personal nutrition plan.  An exercise specialist who can discuss your activity level and exercise plan. Summary  Managing diabetes (diabetes mellitus) can be complicated. Your diabetes treatment may be managed by a team of health care providers.  Your health care providers follow guidelines in order to help you get the best quality of care.  Standards of care including having regular physical exams, blood tests, blood pressure monitoring, immunizations, screening tests, and education about how to manage your diabetes.  Your health care providers may also give you more specific instructions based on your individual health. This information is not intended to replace  advice given to you by your health care provider. Make sure you discuss any questions you have with your health care provider. Document Released: 04/02/2009 Document Revised: 02/22/2018 Document Reviewed: 03/03/2016 Elsevier Patient Education  2020 Elsevier Inc.  

## 2019-02-11 NOTE — Progress Notes (Signed)
   Subjective:   Patient ID: Brooke [name], female    DOB: 02-May-1946, 73 y.o.   MRN: [id number]  HPI The patient is a 73 YO female coming in for follow up of blood pressure (BP at goal at home when she checks, taking losartan 100 mg daily, also on farxiga which may provide benefit, denies chest pains or headaches) and diabetes (taking lantus 18 units nightly and amaryl and farxiga, had she thinks a small yeast infection in her mouth about 2 weeks ago and used hydrogen peroxide to clear this, denies new numbness in feet, denies low sugars recently, had 1 episode with some exercise and not eating well prior) and cholesterol (taking lipitor 40 mg daily, denies side effects, denies chest pains or stroke symptoms).   Review of Systems  Constitutional: Negative.   HENT: Negative.   Eyes: Negative.   Respiratory: Negative for cough, chest tightness and shortness of breath.   Cardiovascular: Negative for chest pain, palpitations and leg swelling.  Gastrointestinal: Negative for abdominal distention, abdominal pain, constipation, diarrhea, nausea and vomiting.  Musculoskeletal: Negative.   Skin: Negative.   Neurological: Negative.   Psychiatric/Behavioral: Negative.     Objective:  Physical Exam Constitutional:      Appearance: She is well-developed.  HENT:     Head: Normocephalic and atraumatic.  Neck:     Musculoskeletal: Normal range of motion.  Cardiovascular:     Rate and Rhythm: Normal rate and regular rhythm.  Pulmonary:     Effort: Pulmonary effort is normal. No respiratory distress.     Breath sounds: Normal breath sounds. No wheezing or rales.  Abdominal:     General: Bowel sounds are normal. There is no distension.     Palpations: Abdomen is soft.     Tenderness: There is no abdominal tenderness. There is no rebound.  Skin:    General: Skin is warm and dry.  Neurological:     Mental Status: She is alert and oriented to person, place, and time.     Coordination:  Coordination normal.     Vitals:   02/11/19 1501  BP: 132/90  Pulse: 71  Temp: 98.9 F (37.2 C)  TempSrc: Oral  SpO2: 98%  Weight: 147 lb (66.7 kg)  Height: 5\' 3"  (1.6 m)    Assessment & Plan:  Flu shot given at visit

## 2019-02-11 NOTE — Assessment & Plan Note (Signed)
BP at goal on losartan 100 mg daily. Checking CMP and adjust as needed.

## 2019-02-11 NOTE — Assessment & Plan Note (Signed)
Took meds off list which she is not taking. Taking glimepiride and lantus 18 units and farxiga. Checking lipid panel and HgA1c. Foot exam done. Reminded about yearly eye exam. On statin and ARB. Adjust as needed.

## 2019-02-11 NOTE — Assessment & Plan Note (Signed)
Checking lipid panel and adjust lipitor 40 mg daily as needed for LDL goal <100.  ?

## 2019-02-17 ENCOUNTER — Encounter: Payer: Self-pay | Admitting: Internal Medicine

## 2019-03-04 ENCOUNTER — Telehealth: Payer: Self-pay

## 2019-03-04 ENCOUNTER — Other Ambulatory Visit: Payer: Self-pay | Admitting: *Deleted

## 2019-03-04 MED ORDER — FLUOXETINE HCL 10 MG PO TABS: 10.0000 mg | ORAL_TABLET | Freq: Every day | ORAL | 5 refills | Status: DC

## 2019-03-04 MED ORDER — ALPRAZOLAM 0.5 MG PO TABS: ORAL_TABLET | ORAL | 0 refills | Status: DC

## 2019-03-04 NOTE — Telephone Encounter (Signed)
Control database checked last refill: 07/23/2018 LOV: 02/11/2019 [id number]

## 2019-03-11 DIAGNOSIS — R0600 Dyspnea, unspecified: Secondary | ICD-10-CM | POA: Insufficient documentation

## 2019-03-11 NOTE — Assessment & Plan Note (Signed)
Remote loss of basal segment aeration with loss of volume then.  No fever, sweats, adenopathy or rash now.

## 2019-03-11 NOTE — Assessment & Plan Note (Signed)
Mostly noted with activity a little greater than ADLs. No acute change, cough or wheeze, chest pain, palpitation or edema. Plan- Try sample Breo 100 for effect as discussed.

## 2019-03-17 ENCOUNTER — Encounter: Payer: Self-pay | Admitting: Internal Medicine

## 2019-03-17 LAB — HM MAMMOGRAPHY

## 2019-03-18 ENCOUNTER — Encounter: Payer: Self-pay | Admitting: Internal Medicine

## 2019-03-18 NOTE — Progress Notes (Signed)
Abstracted and sent to scan  

## 2019-03-28 ENCOUNTER — Other Ambulatory Visit: Payer: Self-pay | Admitting: Internal Medicine

## 2019-03-28 ENCOUNTER — Telehealth: Payer: Self-pay | Admitting: Internal Medicine

## 2019-03-31 NOTE — Telephone Encounter (Signed)
Printed & placed in folder for COVID test to be sched for 02/07/2020 PFT.

## 2019-05-23 ENCOUNTER — Other Ambulatory Visit: Payer: Self-pay | Admitting: Internal Medicine

## 2019-07-03 ENCOUNTER — Other Ambulatory Visit: Payer: Self-pay | Admitting: Internal Medicine

## 2019-07-08 ENCOUNTER — Encounter: Payer: Self-pay | Admitting: Internal Medicine

## 2019-07-09 MED ORDER — LANTUS SOLOSTAR 100 UNIT/ML ~~LOC~~ SOPN: 10.0000 [IU] | PEN_INJECTOR | Freq: Every day | SUBCUTANEOUS | 3 refills | Status: DC

## 2019-07-20 ENCOUNTER — Ambulatory Visit: Payer: Federal, State, Local not specified - PPO

## 2019-07-25 ENCOUNTER — Ambulatory Visit: Payer: Federal, State, Local not specified - PPO

## 2019-07-31 ENCOUNTER — Ambulatory Visit: Payer: Federal, State, Local not specified - PPO

## 2019-08-31 ENCOUNTER — Other Ambulatory Visit: Payer: Self-pay | Admitting: Internal Medicine

## 2019-09-08 ENCOUNTER — Telehealth: Payer: Self-pay

## 2019-09-08 NOTE — Telephone Encounter (Signed)
Key: [id number]

## 2019-09-21 ENCOUNTER — Other Ambulatory Visit: Payer: Self-pay | Admitting: Internal Medicine

## 2019-11-26 ENCOUNTER — Other Ambulatory Visit: Payer: Self-pay | Admitting: Internal Medicine

## 2019-12-22 ENCOUNTER — Other Ambulatory Visit: Payer: Self-pay | Admitting: Internal Medicine

## 2020-01-06 ENCOUNTER — Other Ambulatory Visit: Payer: Self-pay | Admitting: Internal Medicine

## 2020-01-13 ENCOUNTER — Other Ambulatory Visit: Payer: Self-pay

## 2020-01-13 ENCOUNTER — Encounter: Payer: Self-pay | Admitting: Internal Medicine

## 2020-01-13 ENCOUNTER — Ambulatory Visit (INDEPENDENT_AMBULATORY_CARE_PROVIDER_SITE_OTHER): Payer: Federal, State, Local not specified - PPO | Admitting: Internal Medicine

## 2020-01-13 VITALS — BP 120/76 | HR 86 | Temp 98.3°F | Ht 63.0 in | Wt 148.0 lb

## 2020-01-13 DIAGNOSIS — Z0000 Encounter for general adult medical examination without abnormal findings: Secondary | ICD-10-CM | POA: Diagnosis not present

## 2020-01-13 DIAGNOSIS — E1139 Type 2 diabetes mellitus with other diabetic ophthalmic complication: Secondary | ICD-10-CM | POA: Diagnosis not present

## 2020-01-13 DIAGNOSIS — H42 Glaucoma in diseases classified elsewhere: Secondary | ICD-10-CM

## 2020-01-13 DIAGNOSIS — E785 Hyperlipidemia, unspecified: Secondary | ICD-10-CM

## 2020-01-13 DIAGNOSIS — E1169 Type 2 diabetes mellitus with other specified complication: Secondary | ICD-10-CM | POA: Diagnosis not present

## 2020-01-13 DIAGNOSIS — I10 Essential (primary) hypertension: Secondary | ICD-10-CM

## 2020-01-13 MED ORDER — ESCITALOPRAM OXALATE 10 MG PO TABS: 10.0000 mg | ORAL_TABLET | Freq: Every day | ORAL | 3 refills | Status: DC

## 2020-01-13 NOTE — Patient Instructions (Signed)
Health Maintenance, Female Adopting a healthy lifestyle and getting preventive care are important in promoting health and wellness. Ask your health care provider about:  The right schedule for you to have regular tests and exams.  Things you can do on your own to prevent diseases and keep yourself healthy. What should I know about diet, weight, and exercise? Eat a healthy diet   Eat a diet that includes plenty of vegetables, fruits, low-fat dairy products, and lean protein.  Do not eat a lot of foods that are high in solid fats, added sugars, or sodium. Maintain a healthy weight Body mass index (BMI) is used to identify weight problems. It estimates body fat based on height and weight. Your health care provider can help determine your BMI and help you achieve or maintain a healthy weight. Get regular exercise Get regular exercise. This is one of the most important things you can do for your health. Most adults should:  Exercise for at least 150 minutes each week. The exercise should increase your heart rate and make you sweat (moderate-intensity exercise).  Do strengthening exercises at least twice a week. This is in addition to the moderate-intensity exercise.  Spend less time sitting. Even light physical activity can be beneficial. Watch cholesterol and blood lipids Have your blood tested for lipids and cholesterol at 76 years of age, then have this test every 5 years. Have your cholesterol levels checked more often if:  Your lipid or cholesterol levels are high.  You are older than 76 years of age.  You are at high risk for heart disease. What should I know about cancer screening? Depending on your health history and family history, you may need to have cancer screening at various ages. This may include screening for:  Breast cancer.  Cervical cancer.  Colorectal cancer.  Skin cancer.  Lung cancer. What should I know about heart disease, diabetes, and high blood  pressure? Blood pressure and heart disease  High blood pressure causes heart disease and increases the risk of stroke. This is more likely to develop in people who have high blood pressure readings, are of African descent, or are overweight.  Have your blood pressure checked: ? Every 3-5 years if you are 18-39 years of age. ? Every year if you are 40 years old or older. Diabetes Have regular diabetes screenings. This checks your fasting blood sugar level. Have the screening done:  Once every three years after age 40 if you are at a normal weight and have a low risk for diabetes.  More often and at a younger age if you are overweight or have a high risk for diabetes. What should I know about preventing infection? Hepatitis B If you have a higher risk for hepatitis B, you should be screened for this virus. Talk with your health care provider to find out if you are at risk for hepatitis B infection. Hepatitis C Testing is recommended for:  Everyone born from 1945 through 1965.  Anyone with known risk factors for hepatitis C. Sexually transmitted infections (STIs)  Get screened for STIs, including gonorrhea and chlamydia, if: ? You are sexually active and are younger than 76 years of age. ? You are older than 76 years of age and your health care provider tells you that you are at risk for this type of infection. ? Your sexual activity has changed since you were last screened, and you are at increased risk for chlamydia or gonorrhea. Ask your health care provider if   you are at risk.  Ask your health care provider about whether you are at high risk for HIV. Your health care provider may recommend a prescription medicine to help prevent HIV infection. If you choose to take medicine to prevent HIV, you should first get tested for HIV. You should then be tested every 3 months for as long as you are taking the medicine. Pregnancy  If you are about to stop having your period (premenopausal) and  you may become pregnant, seek counseling before you get pregnant.  Take 400 to 800 micrograms (mcg) of folic acid every day if you become pregnant.  Ask for birth control (contraception) if you want to prevent pregnancy. Osteoporosis and menopause Osteoporosis is a disease in which the bones lose minerals and strength with aging. This can result in bone fractures. If you are 65 years old or older, or if you are at risk for osteoporosis and fractures, ask your health care provider if you should:  Be screened for bone loss.  Take a calcium or vitamin D supplement to lower your risk of fractures.  Be given hormone replacement therapy (HRT) to treat symptoms of menopause. Follow these instructions at home: Lifestyle  Do not use any products that contain nicotine or tobacco, such as cigarettes, e-cigarettes, and chewing tobacco. If you need help quitting, ask your health care provider.  Do not use street drugs.  Do not share needles.  Ask your health care provider for help if you need support or information about quitting drugs. Alcohol use  Do not drink alcohol if: ? Your health care provider tells you not to drink. ? You are pregnant, may be pregnant, or are planning to become pregnant.  If you drink alcohol: ? Limit how much you use to 0-1 drink a day. ? Limit intake if you are breastfeeding.  Be aware of how much alcohol is in your drink. In the [place] one drink equals one 12 oz bottle of beer (355 mL), one 5 oz glass of wine (148 mL), or one 1 oz glass of hard liquor (44 mL). General instructions  Schedule regular health, dental, and eye exams.  Stay current with your vaccines.  Tell your health care provider if: ? You often feel depressed. ? You have ever been abused or do not feel safe at home. Summary  Adopting a healthy lifestyle and getting preventive care are important in promoting health and wellness.  Follow your health care provider's instructions about healthy  diet, exercising, and getting tested or screened for diseases.  Follow your health care provider's instructions on monitoring your cholesterol and blood pressure. This information is not intended to replace advice given to you by your health care provider. Make sure you discuss any questions you have with your health care provider. Document Revised: 05/29/2018 Document Reviewed: 05/29/2018 Elsevier Patient Education  2020 Elsevier Inc.  

## 2020-01-13 NOTE — Progress Notes (Signed)
   Subjective:   Patient ID: Brooke [name], female    DOB: September 13, 1945, 74 y.o.   MRN: [id number]  HPI The patient is a 74 YO female coming in for physical.   PMH, [place], social history reviewed and updated  Review of Systems  Constitutional: Negative.   HENT: Negative.   Eyes: Negative.   Respiratory: Negative for cough, chest tightness and shortness of breath.   Cardiovascular: Negative for chest pain, palpitations and leg swelling.  Gastrointestinal: Negative for abdominal distention, abdominal pain, constipation, diarrhea, nausea and vomiting.  Musculoskeletal: Negative.   Skin: Negative.   Neurological: Negative.   Psychiatric/Behavioral: Negative.     Objective:  Physical Exam Constitutional:      Appearance: She is well-developed.  HENT:     Head: Normocephalic and atraumatic.  Cardiovascular:     Rate and Rhythm: Normal rate and regular rhythm.  Pulmonary:     Effort: Pulmonary effort is normal. No respiratory distress.     Breath sounds: Normal breath sounds. No wheezing or rales.  Abdominal:     General: Bowel sounds are normal. There is no distension.     Palpations: Abdomen is soft.     Tenderness: There is no abdominal tenderness. There is no rebound.  Musculoskeletal:     Cervical back: Normal range of motion.  Skin:    General: Skin is warm and dry.  Neurological:     Mental Status: She is alert and oriented to person, place, and time.     Coordination: Coordination normal.    Vitals:   01/13/20 0846  BP: 120/76  Pulse: 86  Temp: 98.3 F (36.8 C)  TempSrc: Oral  SpO2: 95%  Weight: 148 lb (67.1 kg)  Height: 5\' 3"  (1.6 m)   This visit occurred during the SARS-CoV-2 public health emergency.  Safety protocols were in place, including screening questions prior to the visit, additional usage of staff PPE, and extensive cleaning of exam room while observing appropriate contact time as indicated for disinfecting solutions.   Assessment & Plan:

## 2020-01-14 LAB — HEMOGLOBIN A1C
Hgb A1c MFr Bld: 8.1 % of total Hgb — ABNORMAL HIGH (ref <5.7)
Mean Plasma Glucose: 186 (calc)
eAG (mmol/L): 10.3 (calc)

## 2020-01-14 LAB — COMPREHENSIVE METABOLIC PANEL
AG Ratio: 1.8 (calc) (ref 1.0–2.5)
ALT: 15 U/L (ref 6–29)
AST: 15 U/L (ref 10–35)
Albumin: 4.3 g/dL (ref 3.6–5.1)
Alkaline phosphatase (APISO): 101 U/L (ref 37–153)
BUN: 16 mg/dL (ref 7–25)
CO2: 27 mmol/L (ref 20–32)
Calcium: 9.1 mg/dL (ref 8.6–10.4)
Chloride: 101 mmol/L (ref 98–110)
Creat: 0.82 mg/dL (ref 0.60–0.93)
Globulin: 2.4 g/dL (calc) (ref 1.9–3.7)
Glucose, Bld: 127 mg/dL — ABNORMAL HIGH (ref 65–99)
Potassium: 4.5 mmol/L (ref 3.5–5.3)
Sodium: 138 mmol/L (ref 135–146)
Total Bilirubin: 0.5 mg/dL (ref 0.2–1.2)
Total Protein: 6.7 g/dL (ref 6.1–8.1)

## 2020-01-14 LAB — LIPID PANEL
Cholesterol: 173 mg/dL (ref <200)
HDL: 54 mg/dL (ref >=50)
LDL Cholesterol (Calc): 100 mg/dL (calc) — ABNORMAL HIGH
Non-HDL Cholesterol (Calc): 119 mg/dL (calc) (ref <130)
Total CHOL/HDL Ratio: 3.2 (calc) (ref <5.0)
Triglycerides: 97 mg/dL (ref <150)

## 2020-01-14 LAB — CBC
HCT: 40 % (ref 35.0–45.0)
Hemoglobin: 13.2 g/dL (ref 11.7–15.5)
MCH: 29.5 pg (ref 27.0–33.0)
MCHC: 33 g/dL (ref 32.0–36.0)
MCV: 89.3 fL (ref 80.0–100.0)
MPV: 10 fL (ref 7.5–12.5)
Platelets: 297 10*3/uL (ref 140–400)
RBC: 4.48 10*6/uL (ref 3.80–5.10)
RDW: 13.1 % (ref 11.0–15.0)
WBC: 6.5 10*3/uL (ref 3.8–10.8)

## 2020-01-14 NOTE — Assessment & Plan Note (Signed)
BP at goal on losartan 100 mg daily. Checking CMP and adjust as needed.

## 2020-01-14 NOTE — Assessment & Plan Note (Signed)
Taking farxiga, lantus, amaryl. Checking HgA1c and adjust as needed. Up to date on eye exam. Foot exam done. On ARB and statin.

## 2020-01-14 NOTE — Assessment & Plan Note (Signed)
Flu shot yearly. Covid-19 complete. Pneumonia complete. Shingrix counseled. Tetanus up to date. Colonoscopy up to date. Mammogram up to date, pap smear aged out and dexa declines further. Counseled about sun safety and mole surveillance. Counseled about the dangers of distracted driving. Given 10 year screening recommendations.

## 2020-01-14 NOTE — Assessment & Plan Note (Addendum)
Checking lipid panel and adjust lipitor 40 mg daily as needed. 

## 2020-01-21 ENCOUNTER — Ambulatory Visit: Payer: Federal, State, Local not specified - PPO | Admitting: Internal Medicine

## 2020-01-21 ENCOUNTER — Ambulatory Visit: Payer: Federal, State, Local not specified - PPO | Admitting: Adult Health

## 2020-02-06 ENCOUNTER — Other Ambulatory Visit: Payer: Self-pay | Admitting: Internal Medicine

## 2020-02-06 ENCOUNTER — Ambulatory Visit: Payer: Federal, State, Local not specified - PPO | Admitting: Adult Health

## 2020-02-14 ENCOUNTER — Encounter: Payer: Self-pay | Admitting: Internal Medicine

## 2020-02-16 ENCOUNTER — Other Ambulatory Visit: Payer: Self-pay | Admitting: Family

## 2020-02-16 MED ORDER — ALPRAZOLAM 0.5 MG PO TABS: ORAL_TABLET | ORAL | 0 refills | Status: DC

## 2020-02-19 ENCOUNTER — Other Ambulatory Visit: Payer: Self-pay | Admitting: Internal Medicine

## 2020-02-19 DIAGNOSIS — D869 Sarcoidosis, unspecified: Secondary | ICD-10-CM

## 2020-02-20 ENCOUNTER — Ambulatory Visit (INDEPENDENT_AMBULATORY_CARE_PROVIDER_SITE_OTHER): Payer: Federal, State, Local not specified - PPO | Admitting: Internal Medicine

## 2020-02-20 ENCOUNTER — Other Ambulatory Visit: Payer: Self-pay

## 2020-02-20 ENCOUNTER — Encounter: Payer: Self-pay | Admitting: Adult Health

## 2020-02-20 ENCOUNTER — Ambulatory Visit (INDEPENDENT_AMBULATORY_CARE_PROVIDER_SITE_OTHER): Payer: Federal, State, Local not specified - PPO

## 2020-02-20 ENCOUNTER — Ambulatory Visit: Payer: Federal, State, Local not specified - PPO | Admitting: Adult Health

## 2020-02-20 VITALS — BP 144/72 | HR 69 | Temp 97.6°F | Ht 63.0 in | Wt 150.2 lb

## 2020-02-20 DIAGNOSIS — D869 Sarcoidosis, unspecified: Secondary | ICD-10-CM

## 2020-02-20 LAB — PULMONARY FUNCTION TEST
DL/VA % pred: 110 %
DL/VA: 4.56 ml/min/mmHg/L
DLCO cor % pred: 64 %
DLCO cor: 12.15 ml/min/mmHg
DLCO unc % pred: 64 %
DLCO unc: 12.08 ml/min/mmHg
FEF 25-75 Post: 1.92 L/sec
FEF 25-75 Pre: 1.61 L/sec
FEF2575-%Change-Post: 19 %
FEF2575-%Pred-Post: 134 %
FEF2575-%Pred-Pre: 112 %
FEV1-%Change-Post: 1 %
FEV1-%Pred-Post: 83 %
FEV1-%Pred-Pre: 82 %
FEV1-Post: 1.37 L
FEV1-Pre: 1.34 L
FEV1FVC-%Change-Post: 4 %
FEV1FVC-%Pred-Pre: 112 %
FEV6-%Change-Post: -2 %
FEV6-%Pred-Post: 75 %
FEV6-%Pred-Pre: 77 %
FEV6-Post: 1.53 L
FEV6-Pre: 1.56 L
FEV6FVC-%Pred-Post: 104 %
FEV6FVC-%Pred-Pre: 104 %
FVC-%Change-Post: -2 %
FVC-%Pred-Post: 72 %
FVC-%Pred-Pre: 74 %
FVC-Post: 1.53 L
FVC-Pre: 1.56 L
Post FEV1/FVC ratio: 90 %
Post FEV6/FVC ratio: 100 %
Pre FEV1/FVC ratio: 86 %
Pre FEV6/FVC Ratio: 100 %

## 2020-02-20 NOTE — Progress Notes (Signed)
Spirometry pre and post and Dlco done today. ?

## 2020-02-20 NOTE — Progress Notes (Signed)
_0  ID: Brooke [name], female    DOB: Nov 05, 1945, 74 y.o.   MRN: [id number]  Chief Complaint  Patient presents with  . 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  Medical history significant for DVT , Allergic Rhinitis , DVT   TEST/EVENTS :  Office Spirometry 10/26/16-mild restriction of exhaled volume. FVC 1.62/75%, FEV1 1.28/77%, ratio 0.79, FEF 25-75%1.24/82%   02/20/2020 Follow up : Presumed Sarcoid /AR  Patient presents for 1 year follow for presumed sarcoid . Known chronic scarring along lower lobes. No active wheezing or cough . Uses Albuterol on occasion, not often.  PFTs today showed mild restriction similar to 2018. FEV1 82%, ratio 86, FVC 74%, no BD response. DLCO 64%.  Not as active with pandemic , was going to exercise classes but none since pandemic . Active at home , garden.  Covid vaccines utd.  Gets winded with heavy activity , activity tolerance at baseline.   Allergies  Allergen Reactions  . Codeine Nausea And Vomiting    Immunization History  Administered Date(s) Administered  . Fluad Quad(high Dose 65+) 02/11/2019  . Influenza Split 03/19/2012, 03/19/2013  . Influenza, High Dose Seasonal PF 05/02/2016, 03/30/2017, 04/16/2018  . Influenza,inj,Quad PF,6+ Mos 04/02/2014  . Influenza-Unspecified 05/18/2015, 04/16/2018  . PFIZER SARS-COV-2 Vaccination 07/24/2019, 08/14/2019  . Pneumococcal Conjugate-13 10/27/2015  . Pneumococcal Polysaccharide-23 06/19/2012  . Tdap 06/19/2010    Past Medical History:  Diagnosis Date  . Allergic rhinitis   . Diabetes mellitus without complication ([place])   . DVT (deep venous thrombosis) ([place])   . Erosive gastritis with hemorrhage 08/09/2014  . Hypertension   . Peripheral neuropathy    uncertain relation to lung disease  . 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 Pleural effusion   . Sarcoidosis     Tobacco History: Social History   Tobacco Use  Smoking Status Former Smoker  . Packs/day: 0.03  . Years: 15.00  . Pack years: 0.45  . Types: Cigarettes  . Quit date: 06/19/1982  . Years since quitting: 37.6  Smokeless Tobacco Never Used   Counseling given: Not Answered   Outpatient Medications Prior to Visit  Medication Sig Dispense Refill  . albuterol (VENTOLIN HFA) 108 (90 Base) MCG/ACT inhaler INHALE 2 PUFFS BY MOUTH EVERY 6 HOURS IF NEEDED 24 g 4  . ALPRAZolam (XANAX) 0.5 MG tablet TAKE 1/2 TABLET(0.25 MG) BY MOUTH DAILY AS NEEDED FOR ANXIETY 30 tablet 0  . aspirin 81 MG tablet Take 81 mg by mouth daily.    Marland Kitchen atorvastatin (LIPITOR) 40 MG tablet TAKE 1 TABLET(40 MG) BY MOUTH DAILY 90 tablet 1  . BD PEN NEEDLE NANO 2ND GEN 32G X 4 MM MISC USE TO INJECT INSULIN AS DIRECTED 100 each 2  . blood glucose meter kit and supplies Dispense based on patient and insurance preference. Use up to four times daily as directed. (FOR ICD-10 E10.9, E11.9). 1 each 0  . escitalopram (LEXAPRO) 10 MG tablet Take 1 tablet (10 mg total) by mouth daily. 90 tablet 3  . FARXIGA 5 MG TABS tablet TAKE 1 TABLET BY MOUTH DAILY 30 tablet 5  . glimepiride (AMARYL) 2 MG tablet TAKE 1 TABLET BY MOUTH EVERY DAY WITH BREAKFAST 90 tablet 1  . Insulin Glargine (LANTUS SOLOSTAR) 100 UNIT/ML Solostar Pen Inject 10-25 Units into the skin daily. 5 pen  3  . latanoprost (XALATAN) 0.005 % ophthalmic solution 1 drop in both eyes at bedtime  0  . losartan (COZAAR) 100 MG tablet TAKE 1 TABLET BY MOUTH EVERY DAY 90 tablet 1  . ONETOUCH ULTRA test strip TEST FOUR TIMES DAILY AS DIRECTED 100 strip 2  . fluticasone furoate-vilanterol (BREO ELLIPTA) 100-25 MCG/INH AEPB Inhale 1 puff into the lungs daily. (Patient not taking: Reported on 01/13/2020) 1 each 0   No facility-administered medications prior to visit.     Review of Systems:   Constitutional:   No  weight loss, night sweats,   Fevers, chills, + fatigue, or  lassitude.  HEENT:   No headaches,  Difficulty swallowing,  Tooth/dental problems, or  Sore throat,                No sneezing, itching, ear ache, nasal congestion, post nasal drip,   CV:  No chest pain,  Orthopnea, PND, swelling in lower extremities, anasarca, dizziness, palpitations, syncope.   GI  No heartburn, indigestion, abdominal pain, nausea, vomiting, diarrhea, change in bowel habits, loss of appetite, bloody stools.   Resp:    No excess mucus, no productive cough,  No non-productive cough,  No coughing up of blood.  No change in color of mucus.  No wheezing.  No chest wall deformity  Skin: no rash or lesions.  GU: no dysuria, change in color of urine, no urgency or frequency.  No flank pain, no hematuria   MS:  No joint pain or swelling.  No decreased range of motion.  No back pain.    Physical Exam  BP (!) 144/72 (BP Location: Left Arm, Cuff Size: Normal)   Pulse 69   Temp 97.6 F (36.4 C) (Temporal)   Ht 5' 3" (1.6 m)   Wt 150 lb 3.2 oz (68.1 kg)   SpO2 97% Comment: RA  BMI 26.61 kg/m   GEN: A/Ox3; pleasant , NAD, well nourished    HEENT:  [place]/AT,  EACs-clear, TMs-wnl, NOSE-clear, THROAT-clear, no lesions, no postnasal drip or exudate noted.   NECK:  Supple w/ fair ROM; no JVD; normal carotid impulses w/o bruits; no thyromegaly or nodules palpated; no lymphadenopathy.    RESP  Clear  P & A; w/o, wheezes/ rales/ or rhonchi. no accessory muscle use, no dullness to percussion  CARD:  RRR, no m/r/g, no peripheral edema, pulses intact, no cyanosis or clubbing.  GI:   Soft & nt; nml bowel sounds; no organomegaly or masses detected.   Musco: Warm bil, no deformities or joint swelling noted.   Neuro: alert, no focal deficits noted.    Skin: Warm, no lesions or rashes    Lab Results:   BMET  BNP No results found for: BNP  ProBNP No results found for: PROBNP  Imaging: No results found.    PFT Results Latest Ref Rng &  Units 02/20/2020  FVC-Pre L 1.56  FVC-Predicted Pre % 74  FVC-Post L 1.53  FVC-Predicted Post % 72  Pre FEV1/FVC % % 86  Post FEV1/FCV % % 90  FEV1-Pre L 1.34  FEV1-Predicted Pre % 82  FEV1-Post L 1.37  DLCO uncorrected ml/min/mmHg 12.08  DLCO UNC% % 64  DLCO corrected ml/min/mmHg 12.15  DLCO COR %Predicted % 64  DLVA Predicted % 110    No results found for: NITRICOXIDE      Assessment & Plan:   SARCOIDOSIS, PULMONARY Clinically stable , PFT are similar to 2018 . DLCO is decreased suspect related to previous   scarring  Check cxr   Plan  Patient Instructions  Albuterol as needed.  Activity as tolerated  Chest xray today .  Flu shot when available.  Follow up Dr. Young  In 1 year and As needed   Please contact office for sooner follow up if symptoms do not improve or worsen or seek emergency care           Tammy Parrett, NP 02/20/2020  

## 2020-02-20 NOTE — Assessment & Plan Note (Signed)
Clinically stable , PFT are similar to 2018 . DLCO is decreased suspect related to previous scarring  Check cxr   Plan  Patient Instructions  Albuterol as needed.  Activity as tolerated  Chest xray today .  Flu shot when available.  Follow up Dr. Annamaria Boots  In 1 year and As needed   Please contact office for sooner follow up if symptoms do not improve or worsen or seek emergency care

## 2020-02-20 NOTE — Patient Instructions (Signed)
Albuterol as needed.  Activity as tolerated  Chest xray today .  Flu shot when available.  Follow up Dr. Annamaria Boots  In 1 year and As needed   Please contact office for sooner follow up if symptoms do not improve or worsen or seek emergency care

## 2020-02-26 ENCOUNTER — Other Ambulatory Visit: Payer: Self-pay | Admitting: Internal Medicine

## 2020-03-06 ENCOUNTER — Other Ambulatory Visit: Payer: Self-pay | Admitting: Internal Medicine

## 2020-03-16 ENCOUNTER — Encounter: Payer: Self-pay | Admitting: Internal Medicine

## 2020-03-16 LAB — HM MAMMOGRAPHY

## 2020-03-19 ENCOUNTER — Other Ambulatory Visit: Payer: Self-pay | Admitting: Internal Medicine

## 2020-03-23 ENCOUNTER — Ambulatory Visit: Payer: Federal, State, Local not specified - PPO | Attending: Internal Medicine

## 2020-03-23 DIAGNOSIS — Z23 Encounter for immunization: Secondary | ICD-10-CM

## 2020-03-23 NOTE — Progress Notes (Signed)
   Covid-19 Vaccination Clinic  Name:  Brooke [name]    MRN: [id number] DOB: 1946-03-17  03/23/2020  Brooke [name] was observed post Covid-19 immunization for 15 minutes without incident. She was provided with Vaccine Information Sheet and instruction to access the V-Safe system.   Brooke [name] was instructed to call 911 with any severe reactions post vaccine: Marland Kitchen Difficulty breathing  . Swelling of face and throat  . A fast heartbeat  . A bad rash all over body  . Dizziness and weakness

## 2020-04-03 ENCOUNTER — Other Ambulatory Visit: Payer: Self-pay | Admitting: Internal Medicine

## 2020-04-30 ENCOUNTER — Other Ambulatory Visit: Payer: Self-pay | Admitting: Internal Medicine

## 2020-05-19 ENCOUNTER — Encounter: Payer: Self-pay | Admitting: Internal Medicine

## 2020-05-19 ENCOUNTER — Other Ambulatory Visit: Payer: Self-pay

## 2020-05-19 MED ORDER — ATORVASTATIN CALCIUM 40 MG PO TABS
ORAL_TABLET | ORAL | 1 refills | Status: DC
Start: 2020-05-19 — End: 2020-11-16

## 2020-05-30 ENCOUNTER — Other Ambulatory Visit: Payer: Self-pay | Admitting: Internal Medicine

## 2020-07-01 ENCOUNTER — Other Ambulatory Visit: Payer: Self-pay | Admitting: Internal Medicine

## 2020-08-16 ENCOUNTER — Encounter: Payer: Self-pay | Admitting: Internal Medicine

## 2020-08-16 ENCOUNTER — Other Ambulatory Visit: Payer: Self-pay

## 2020-08-16 ENCOUNTER — Ambulatory Visit: Payer: Federal, State, Local not specified - PPO | Admitting: Internal Medicine

## 2020-08-16 VITALS — BP 130/68 | HR 70 | Temp 98.1°F | Resp 18 | Ht 63.0 in | Wt 152.4 lb

## 2020-08-16 DIAGNOSIS — H42 Glaucoma in diseases classified elsewhere: Secondary | ICD-10-CM | POA: Diagnosis not present

## 2020-08-16 DIAGNOSIS — F4323 Adjustment disorder with mixed anxiety and depressed mood: Secondary | ICD-10-CM

## 2020-08-16 DIAGNOSIS — I10 Essential (primary) hypertension: Secondary | ICD-10-CM

## 2020-08-16 DIAGNOSIS — E1139 Type 2 diabetes mellitus with other diabetic ophthalmic complication: Secondary | ICD-10-CM

## 2020-08-16 LAB — LIPID PANEL
Cholesterol: 153 mg/dL (ref 0–200)
HDL: 47.5 mg/dL (ref >39.00)
LDL Cholesterol: 89 mg/dL (ref 0–99)
NonHDL: 105.64
Total CHOL/HDL Ratio: 3
Triglycerides: 85 mg/dL (ref 0.0–149.0)
VLDL: 17 mg/dL (ref 0.0–40.0)

## 2020-08-16 LAB — COMPREHENSIVE METABOLIC PANEL
ALT: 14 U/L (ref 0–35)
AST: 14 U/L (ref 0–37)
Albumin: 4.3 g/dL (ref 3.5–5.2)
Alkaline Phosphatase: 88 U/L (ref 39–117)
BUN: 16 mg/dL (ref 6–23)
CO2: 28 mEq/L (ref 19–32)
Calcium: 9.4 mg/dL (ref 8.4–10.5)
Chloride: 101 mEq/L (ref 96–112)
Creatinine, Ser: 0.9 mg/dL (ref 0.40–1.20)
GFR: 61.89 mL/min (ref >60.00)
Glucose, Bld: 121 mg/dL — ABNORMAL HIGH (ref 70–99)
Potassium: 4.8 mEq/L (ref 3.5–5.1)
Sodium: 138 mEq/L (ref 135–145)
Total Bilirubin: 0.5 mg/dL (ref 0.2–1.2)
Total Protein: 7.2 g/dL (ref 6.0–8.3)

## 2020-08-16 LAB — HEMOGLOBIN A1C: Hgb A1c MFr Bld: 8.9 % — ABNORMAL HIGH (ref 4.6–6.5)

## 2020-08-16 NOTE — Patient Instructions (Signed)
We will check the labs today. 

## 2020-08-16 NOTE — Progress Notes (Signed)
   Subjective:   Patient ID: Brooke [name], female    DOB: 05-09-1946, 75 y.o.   MRN: [id number]  HPI The patient is a 75 YO female coming in for management of her diabetes (she is taking her lantus 23 units at night time and notices most morning sugars are 100-120, rare higher if she eats the wrong thing or too much, also taking farxiga, denies side effects, denies numbness or tingling), and moos (we did switch her prozac to lexapro at last visit, she is doing well with this, felt like it took about a month to kick in and level out, denies SI/HI, is overall satisfied with mental health currently) and blood pressure (taking losartan 100 mg daily and denies headaches or chest pains, overall does not check BP at home).   Review of Systems  Constitutional: Negative.   HENT: Negative.   Eyes: Negative.   Respiratory: Negative for cough, chest tightness and shortness of breath.   Cardiovascular: Negative for chest pain, palpitations and leg swelling.  Gastrointestinal: Negative for abdominal distention, abdominal pain, constipation, diarrhea, nausea and vomiting.  Musculoskeletal: Negative.   Skin: Negative.   Neurological: Negative.   Psychiatric/Behavioral: Negative.     Objective:  Physical Exam Constitutional:      Appearance: She is well-developed and well-nourished.  HENT:     Head: Normocephalic and atraumatic.  Eyes:     Extraocular Movements: EOM normal.  Cardiovascular:     Rate and Rhythm: Normal rate and regular rhythm.  Pulmonary:     Effort: Pulmonary effort is normal. No respiratory distress.     Breath sounds: Normal breath sounds. No wheezing or rales.  Abdominal:     General: Bowel sounds are normal. There is no distension.     Palpations: Abdomen is soft.     Tenderness: There is no abdominal tenderness. There is no rebound.  Musculoskeletal:        General: No edema.     Cervical back: Normal range of motion.  Skin:    General: Skin is warm and dry.   Neurological:     Mental Status: She is alert and oriented to person, place, and time.     Coordination: Coordination normal.  Psychiatric:        Mood and Affect: Mood and affect normal.     Vitals:   08/16/20 1102  BP: 130/68  Pulse: 70  Resp: 18  Temp: 98.1 F (36.7 C)  TempSrc: Oral  SpO2: 97%  Weight: 152 lb 6.4 oz (69.1 kg)  Height: 5\' 3"  (1.6 m)    This visit occurred during the SARS-CoV-2 public health emergency.  Safety protocols were in place, including screening questions prior to the visit, additional usage of staff PPE, and extensive cleaning of exam room while observing appropriate contact time as indicated for disinfecting solutions.   Assessment & Plan:

## 2020-08-18 ENCOUNTER — Encounter: Payer: Self-pay | Admitting: Internal Medicine

## 2020-08-18 DIAGNOSIS — F4323 Adjustment disorder with mixed anxiety and depressed mood: Secondary | ICD-10-CM | POA: Insufficient documentation

## 2020-08-18 DIAGNOSIS — F331 Major depressive disorder, recurrent, moderate: Secondary | ICD-10-CM | POA: Insufficient documentation

## 2020-08-18 NOTE — Assessment & Plan Note (Signed)
BP at goal on losartan 100 mg daily. Checking CMP and adjust as needed.

## 2020-08-18 NOTE — Assessment & Plan Note (Signed)
Doing well on lexapro and rare xanax usage. Overall stable and would like to continue on same dosing.

## 2020-08-18 NOTE — Assessment & Plan Note (Signed)
Checking HgA1c and we talked about how morning sugars are at goal and would not recommend increasing insulin at this time. Keep lantus 23 units and farxiga. Depending on HgA1c may need additional agent or potentially switch to soliqua to add glp-1.

## 2020-09-20 ENCOUNTER — Other Ambulatory Visit: Payer: Self-pay | Admitting: Internal Medicine

## 2020-10-04 ENCOUNTER — Other Ambulatory Visit: Payer: Self-pay

## 2020-10-04 ENCOUNTER — Encounter: Payer: Self-pay | Admitting: Internal Medicine

## 2020-10-04 MED ORDER — DAPAGLIFLOZIN PROPANEDIOL 5 MG PO TABS
5.0000 mg | ORAL_TABLET | Freq: Every day | ORAL | 5 refills | Status: DC
Start: 2020-10-04 — End: 2021-02-24

## 2020-10-12 ENCOUNTER — Telehealth: Payer: Self-pay

## 2020-10-12 ENCOUNTER — Other Ambulatory Visit: Payer: Self-pay | Admitting: Family

## 2020-10-12 NOTE — Telephone Encounter (Signed)
PA has been initiated on covermymeds for Farxiga 5 mg tablets and was instantly approved through 10/12/2021  Key: [facility] PA Case ID: [id number]   LDVM letting the patient know about her approval.

## 2020-11-15 ENCOUNTER — Other Ambulatory Visit: Payer: Self-pay | Admitting: Internal Medicine

## 2020-11-16 ENCOUNTER — Other Ambulatory Visit: Payer: Self-pay | Admitting: Internal Medicine

## 2020-12-07 ENCOUNTER — Other Ambulatory Visit: Payer: Self-pay | Admitting: Internal Medicine

## 2020-12-07 ENCOUNTER — Telehealth: Payer: Self-pay | Admitting: Internal Medicine

## 2020-12-07 MED ORDER — GLIMEPIRIDE 2 MG PO TABS: 2.0000 mg | ORAL_TABLET | Freq: Every day | ORAL | 0 refills | Status: DC

## 2020-12-07 NOTE — Telephone Encounter (Signed)
1.Medication Requested: glimepiride (AMARYL) 2 MG tablet   2. Pharmacy (Name, [facility]): [facility], [address] AT [place]  3. On Med List: yes   4. Last Visit with PCP: 08-16-20  5. Next visit date with PCP: n/a    Agent: Please be advised that RX refills may take up to 3 business days. We ask that you follow-up with your pharmacy.

## 2020-12-07 NOTE — Telephone Encounter (Signed)
Medication has been sent to the patient's pharmacy.  

## 2021-01-13 ENCOUNTER — Other Ambulatory Visit: Payer: Self-pay | Admitting: Internal Medicine

## 2021-02-07 ENCOUNTER — Other Ambulatory Visit: Payer: Self-pay | Admitting: Internal Medicine

## 2021-02-09 ENCOUNTER — Encounter: Payer: Self-pay | Admitting: Internal Medicine

## 2021-02-18 ENCOUNTER — Other Ambulatory Visit: Payer: Self-pay | Admitting: Internal Medicine

## 2021-02-18 MED ORDER — GLIMEPIRIDE 2 MG PO TABS: 2.0000 mg | ORAL_TABLET | Freq: Every day | ORAL | 0 refills | Status: DC

## 2021-02-22 ENCOUNTER — Encounter: Payer: Self-pay | Admitting: Internal Medicine

## 2021-02-22 ENCOUNTER — Ambulatory Visit (INDEPENDENT_AMBULATORY_CARE_PROVIDER_SITE_OTHER): Payer: Federal, State, Local not specified - PPO | Admitting: Internal Medicine

## 2021-02-22 ENCOUNTER — Other Ambulatory Visit: Payer: Self-pay

## 2021-02-22 ENCOUNTER — Ambulatory Visit: Payer: Federal, State, Local not specified - PPO | Admitting: Internal Medicine

## 2021-02-22 VITALS — BP 130/66 | HR 75 | Temp 98.6°F | Resp 18 | Ht 63.0 in | Wt 148.6 lb

## 2021-02-22 DIAGNOSIS — I10 Essential (primary) hypertension: Secondary | ICD-10-CM

## 2021-02-22 DIAGNOSIS — H42 Glaucoma in diseases classified elsewhere: Secondary | ICD-10-CM | POA: Diagnosis not present

## 2021-02-22 DIAGNOSIS — E1169 Type 2 diabetes mellitus with other specified complication: Secondary | ICD-10-CM

## 2021-02-22 DIAGNOSIS — Z0000 Encounter for general adult medical examination without abnormal findings: Secondary | ICD-10-CM | POA: Diagnosis not present

## 2021-02-22 DIAGNOSIS — E785 Hyperlipidemia, unspecified: Secondary | ICD-10-CM

## 2021-02-22 DIAGNOSIS — E1139 Type 2 diabetes mellitus with other diabetic ophthalmic complication: Secondary | ICD-10-CM

## 2021-02-22 DIAGNOSIS — F4323 Adjustment disorder with mixed anxiety and depressed mood: Secondary | ICD-10-CM

## 2021-02-22 LAB — COMPREHENSIVE METABOLIC PANEL
ALT: 17 U/L (ref 0–35)
AST: 17 U/L (ref 0–37)
Albumin: 4.3 g/dL (ref 3.5–5.2)
Alkaline Phosphatase: 92 U/L (ref 39–117)
BUN: 18 mg/dL (ref 6–23)
CO2: 29 mEq/L (ref 19–32)
Calcium: 9.5 mg/dL (ref 8.4–10.5)
Chloride: 99 mEq/L (ref 96–112)
Creatinine, Ser: 0.88 mg/dL (ref 0.40–1.20)
GFR: 63.34 mL/min (ref >60.00)
Glucose, Bld: 141 mg/dL — ABNORMAL HIGH (ref 70–99)
Potassium: 4.8 mEq/L (ref 3.5–5.1)
Sodium: 135 mEq/L (ref 135–145)
Total Bilirubin: 0.5 mg/dL (ref 0.2–1.2)
Total Protein: 7.4 g/dL (ref 6.0–8.3)

## 2021-02-22 LAB — CBC
HCT: 43.6 % (ref 36.0–46.0)
Hemoglobin: 14.2 g/dL (ref 12.0–15.0)
MCHC: 32.5 g/dL (ref 30.0–36.0)
MCV: 90.2 fl (ref 78.0–100.0)
Platelets: 302 10*3/uL (ref 150.0–400.0)
RBC: 4.83 Mil/uL (ref 3.87–5.11)
RDW: 13.9 % (ref 11.5–15.5)
WBC: 5.6 10*3/uL (ref 4.0–10.5)

## 2021-02-22 LAB — HEMOGLOBIN A1C: Hgb A1c MFr Bld: 9.2 % — ABNORMAL HIGH (ref 4.6–6.5)

## 2021-02-22 LAB — LIPID PANEL
Cholesterol: 156 mg/dL (ref 0–200)
HDL: 46.2 mg/dL (ref >39.00)
LDL Cholesterol: 87 mg/dL (ref 0–99)
NonHDL: 109.71
Total CHOL/HDL Ratio: 3
Triglycerides: 114 mg/dL (ref 0.0–149.0)
VLDL: 22.8 mg/dL (ref 0.0–40.0)

## 2021-02-22 NOTE — Progress Notes (Signed)
   Subjective:   Patient ID: Brooke [name], female    DOB: 24-Nov-1945, 75 y.o.   MRN: [id number]  HPI The patient is a 75 YO female coming in for physical.   PMH, [place], social history reviewed and updated  Review of Systems  Constitutional: Negative.   HENT: Negative.    Eyes: Negative.   Respiratory:  Negative for cough, chest tightness and shortness of breath.   Cardiovascular:  Negative for chest pain, palpitations and leg swelling.  Gastrointestinal:  Negative for abdominal distention, abdominal pain, constipation, diarrhea, nausea and vomiting.  Musculoskeletal: Negative.   Skin: Negative.   Neurological: Negative.   Psychiatric/Behavioral: Negative.     Objective:  Physical Exam Constitutional:      Appearance: She is well-developed.  HENT:     Head: Normocephalic and atraumatic.  Cardiovascular:     Rate and Rhythm: Normal rate and regular rhythm.  Pulmonary:     Effort: Pulmonary effort is normal. No respiratory distress.     Breath sounds: Normal breath sounds. No wheezing or rales.  Abdominal:     General: Bowel sounds are normal. There is no distension.     Palpations: Abdomen is soft.     Tenderness: There is no abdominal tenderness. There is no rebound.  Musculoskeletal:     Cervical back: Normal range of motion.  Skin:    General: Skin is warm and dry.     Comments: Foot exam done  Neurological:     Mental Status: She is alert and oriented to person, place, and time.     Coordination: Coordination normal.    Vitals:   02/22/21 1057  BP: 130/66  Pulse: 75  Resp: 18  Temp: 98.6 F (37 C)  TempSrc: Oral  SpO2: 98%  Weight: 148 lb 9.6 oz (67.4 kg)  Height: '5\' 3"'$  (1.6 m)    This visit occurred during the SARS-CoV-2 public health emergency.  Safety protocols were in place, including screening questions prior to the visit, additional usage of staff PPE, and extensive cleaning of exam room while observing appropriate contact time as indicated for  disinfecting solutions.   Assessment & Plan:

## 2021-02-22 NOTE — Patient Instructions (Signed)
We will check the labs today and will let you know about the results.

## 2021-02-23 ENCOUNTER — Encounter: Payer: Self-pay | Admitting: Internal Medicine

## 2021-02-23 NOTE — Assessment & Plan Note (Signed)
Foot exam done, reminded about yearly eye exam. Taking amaryl and lantus. Is on ARB and statin. Checking HgA1c and adjust as needed. Past HgA1c not at goal and she did not get back in touch with [place] about possible changes to regimen.

## 2021-02-23 NOTE — Assessment & Plan Note (Signed)
Checking lipid panel and adjust lipitor as needed. Goal LDL <70.

## 2021-02-23 NOTE — Assessment & Plan Note (Signed)
Flu shot yearly. Covid-19 booster counseled. Pneumonia complete. Shingrix declines. Tetanus declines. Colonoscopy aged out prior to recall. Mammogram up to date, pap smear aged out and dexa up to date. Counseled about sun safety and mole surveillance. Counseled about the dangers of distracted driving. Given 10 year screening recommendations.

## 2021-02-23 NOTE — Assessment & Plan Note (Signed)
Taking lexapro 10 mg daily and rare xanax prn. Overall satisfied with level of control.

## 2021-02-23 NOTE — Assessment & Plan Note (Signed)
BP at goal on losartan 100 mg daily, checking CMP and adjust as needed.

## 2021-02-24 MED ORDER — DAPAGLIFLOZIN PROPANEDIOL 10 MG PO TABS: 10.0000 mg | ORAL_TABLET | Freq: Every day | ORAL | 1 refills | Status: DC

## 2021-03-03 ENCOUNTER — Other Ambulatory Visit: Payer: Self-pay | Admitting: Internal Medicine

## 2021-03-18 ENCOUNTER — Other Ambulatory Visit: Payer: Self-pay | Admitting: Internal Medicine

## 2021-04-07 ENCOUNTER — Other Ambulatory Visit: Payer: Self-pay

## 2021-04-11 ENCOUNTER — Telehealth: Payer: Self-pay | Admitting: Hematology

## 2021-04-11 NOTE — Telephone Encounter (Signed)
Spoke to patient to confirm morning clinic appointment for 11/2

## 2021-04-12 ENCOUNTER — Encounter: Payer: Self-pay | Admitting: Internal Medicine

## 2021-04-12 ENCOUNTER — Other Ambulatory Visit: Payer: Self-pay | Admitting: Internal Medicine

## 2021-04-15 ENCOUNTER — Encounter: Payer: Self-pay | Admitting: *Deleted

## 2021-04-15 DIAGNOSIS — C50211 Malignant neoplasm of upper-inner quadrant of right female breast: Secondary | ICD-10-CM

## 2021-04-15 DIAGNOSIS — Z170 Estrogen receptor positive status [ER+]: Secondary | ICD-10-CM

## 2021-04-15 HISTORY — DX: Estrogen receptor positive status (ER+): C50.211

## 2021-04-15 HISTORY — DX: Estrogen receptor positive status (ER+): Z17.0

## 2021-04-20 ENCOUNTER — Ambulatory Visit
Admission: RE | Admit: 2021-04-20 | Discharge: 2021-04-20 | Disposition: A | Discharge status: Home / Self Care | Payer: Federal, State, Local not specified - PPO | Source: Ambulatory Visit | Attending: Radiation Oncology | Admitting: Radiation Oncology

## 2021-04-20 ENCOUNTER — Ambulatory Visit
Admission: RE | Admit: 2021-04-20 | Discharge: 2021-04-20 | Disposition: A | Discharge status: Home / Self Care | Payer: Federal, State, Local not specified - PPO | Source: Ambulatory Visit | Admitting: Radiation Oncology

## 2021-04-20 ENCOUNTER — Inpatient Hospital Stay: Payer: Federal, State, Local not specified - PPO | Attending: Hematology

## 2021-04-20 ENCOUNTER — Encounter: Payer: Self-pay | Admitting: *Deleted

## 2021-04-20 ENCOUNTER — Inpatient Hospital Stay (HOSPITAL_BASED_OUTPATIENT_CLINIC_OR_DEPARTMENT_OTHER): Payer: Federal, State, Local not specified - PPO | Admitting: Hematology

## 2021-04-20 ENCOUNTER — Other Ambulatory Visit: Payer: Self-pay | Admitting: General Surgery

## 2021-04-20 ENCOUNTER — Ambulatory Visit: Payer: Federal, State, Local not specified - PPO | Admitting: Radiation Oncology

## 2021-04-20 ENCOUNTER — Other Ambulatory Visit: Payer: Self-pay

## 2021-04-20 ENCOUNTER — Encounter: Payer: Self-pay | Admitting: Hematology

## 2021-04-20 ENCOUNTER — Ambulatory Visit: Payer: Federal, State, Local not specified - PPO | Admitting: Physical Therapy

## 2021-04-20 VITALS — BP 176/82 | HR 72 | Temp 97.0°F | Resp 18 | Ht 63.0 in | Wt 148.7 lb

## 2021-04-20 DIAGNOSIS — Z170 Estrogen receptor positive status [ER+]: Secondary | ICD-10-CM

## 2021-04-20 DIAGNOSIS — C50211 Malignant neoplasm of upper-inner quadrant of right female breast: Secondary | ICD-10-CM

## 2021-04-20 DIAGNOSIS — Z87891 Personal history of nicotine dependence: Secondary | ICD-10-CM | POA: Insufficient documentation

## 2021-04-20 LAB — CMP (CANCER CENTER ONLY)
ALT: 14 U/L (ref 0–44)
AST: 15 U/L (ref 15–41)
Albumin: 3.9 g/dL (ref 3.5–5.0)
Alkaline Phosphatase: 103 U/L (ref 38–126)
Anion gap: 9 (ref 5–15)
BUN: 15 mg/dL (ref 8–23)
CO2: 26 mmol/L (ref 22–32)
Calcium: 8.8 mg/dL — ABNORMAL LOW (ref 8.9–10.3)
Chloride: 104 mmol/L (ref 98–111)
Creatinine: 0.92 mg/dL (ref 0.44–1.00)
GFR, Estimated: >60 mL/min (ref >60)
Glucose, Bld: 154 mg/dL — ABNORMAL HIGH (ref 70–99)
Potassium: 4.7 mmol/L (ref 3.5–5.1)
Sodium: 139 mmol/L (ref 135–145)
Total Bilirubin: 0.4 mg/dL (ref 0.3–1.2)
Total Protein: 7 g/dL (ref 6.5–8.1)

## 2021-04-20 LAB — CBC WITH DIFFERENTIAL (CANCER CENTER ONLY)
Abs Immature Granulocytes: 0.01 10*3/uL (ref 0.00–0.07)
Basophils Absolute: 0.1 10*3/uL (ref 0.0–0.1)
Basophils Relative: 1 %
Eosinophils Absolute: 0.2 10*3/uL (ref 0.0–0.5)
Eosinophils Relative: 3 %
HCT: 40.8 % (ref 36.0–46.0)
Hemoglobin: 13.4 g/dL (ref 12.0–15.0)
Immature Granulocytes: 0 %
Lymphocytes Relative: 35 %
Lymphs Abs: 1.9 10*3/uL (ref 0.7–4.0)
MCH: 29.5 pg (ref 26.0–34.0)
MCHC: 32.8 g/dL (ref 30.0–36.0)
MCV: 89.7 fL (ref 80.0–100.0)
Monocytes Absolute: 0.5 10*3/uL (ref 0.1–1.0)
Monocytes Relative: 9 %
Neutro Abs: 2.7 10*3/uL (ref 1.7–7.7)
Neutrophils Relative %: 52 %
Platelet Count: 286 10*3/uL (ref 150–400)
RBC: 4.55 MIL/uL (ref 3.87–5.11)
RDW: 13.1 % (ref 11.5–15.5)
WBC Count: 5.3 10*3/uL (ref 4.0–10.5)
nRBC: 0 % (ref 0.0–0.2)

## 2021-04-20 LAB — GENETIC SCREENING ORDER

## 2021-04-20 NOTE — Progress Notes (Signed)
Radiation Oncology         [phone] ________________________________  Name: Brooke [name]        MRN: [id number]  Date of Service: 04/20/2021 DOB: [birth year]  MW:[id number], Real Cons, MD  Stark Klein, MD     REFERRING PHYSICIAN: Stark Klein, MD   DIAGNOSIS: The encounter diagnosis was Malignant neoplasm of upper-inner quadrant of right breast in female, estrogen receptor positive ([place]).   HISTORY OF PRESENT ILLNESS: Brooke [name] is a 75 y.o. female seen in the multidisciplinary breast clinic for a new diagnosis of right breast cancer. The patient was found to have architectural distortion in the right breast and diagnostic imaging showed a mass in the 2:00 position measuring up to 9 mm. Her right axilla was negative for adenopathy. 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 She's seen today to discuss treatment recommendations of her cancer.      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    Pleural effusion    Sarcoidosis        PAST SURGICAL HISTORY: Past Surgical History:  Procedure Laterality Date   CHOLECYSTECTOMY  1975   COLONOSCOPY     ESOPHAGOGASTRODUODENOSCOPY     ESOPHAGOGASTRODUODENOSCOPY N/A 08/09/2014   Procedure: ESOPHAGOGASTRODUODENOSCOPY (EGD);  Surgeon: Gatha Mayer, MD;  [facility] ENDOSCOPY;  Service: Endoscopy;  Laterality: N/A;   FOOT SURGERY  2013    left     FAMILY HISTORY:  Family History  Problem Relation Age of Onset   Hypertension Mother    Colon cancer Neg Hx      SOCIAL HISTORY:  reports that she quit smoking about 38 years ago. Her smoking use included cigarettes. She has a 0.45 pack-year smoking history. She has never used smokeless tobacco. She reports that she does not drink alcohol and does not use drugs. She is single and lives in [place]. 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 She still enjoys volunteering with local campaigns and assists if candidates are elected once in their seat. She is accompanied by her daughter Brooke [name].      ALLERGIES: Codeine   MEDICATIONS:  Current Outpatient Medications  Medication Sig Dispense Refill   albuterol (VENTOLIN HFA) 108 (90 Base) MCG/ACT inhaler INHALE 2 PUFFS BY MOUTH EVERY 6 HOURS AS NEEDED 25.5 g 11   ALPRAZolam (XANAX) 0.5 MG tablet TAKE 1/2 TABLET(0.25 MG) BY MOUTH DAILY AS NEEDED FOR ANXIETY 30 tablet 0   aspirin 81 MG tablet Take 81 mg by mouth daily.     atorvastatin (LIPITOR) 40 MG tablet TAKE 1 TABLET(40 MG) BY MOUTH DAILY 90 tablet 1   BD PEN NEEDLE NANO 2ND GEN 32G X 4 MM MISC USE TO INJECT INSULIN AS DIRECTED 100 each 2   blood glucose meter kit and supplies Dispense based on patient and insurance preference. Use up to four times daily as directed. (FOR  ICD-10 E10.9, E11.9). 1 each 0   dapagliflozin propanediol (FARXIGA) 10 MG TABS tablet Take 1 tablet (10 mg total) by mouth daily. 90 tablet 1   escitalopram (LEXAPRO) 10 MG tablet TAKE 1 TABLET(10 MG) BY MOUTH DAILY 90 tablet 3   glimepiride (AMARYL) 2 MG tablet Take 1 tablet (2 mg total) by mouth daily with breakfast. Overdue for Annual appt due in must see provider for future refills 60 tablet 0   LANTUS SOLOSTAR 100 UNIT/ML Solostar Pen ADMINISTER 10 TO 25 UNITS UNDER THE SKIN DAILY 15 mL 2   latanoprost (XALATAN) 0.005 % ophthalmic solution 1 drop in both eyes at bedtime   0   losartan (COZAAR) 100 MG tablet TAKE 1 TABLET BY MOUTH EVERY DAY 90 tablet 1   ONETOUCH ULTRA test strip TEST FOUR TIMES DAILY AS DIRECTED 100 strip 2   No current facility-administered medications for this encounter.     REVIEW OF SYSTEMS: On review of systems, the patient reports that she is doing well overall. She denies any specific breast complaints. She has been nervous about her diagnosis but is glad to hear the reassuring findings thusfar. She has hot flashes at baseline. No other complaints are verbalized.     PHYSICAL EXAM:  Wt Readings from Last 3 Encounters:  02/22/21 148 lb 9.6 oz (67.4 kg)  08/16/20 152 lb 6.4 oz (69.1 kg)  02/20/20 150 lb 3.2 oz (68.1 kg)   Temp Readings from Last 3 Encounters:  02/22/21 98.6 F (37 C) (Oral)  08/16/20 98.1 F (36.7 C) (Oral)  02/20/20 97.6 F (36.4 C) (Temporal)   BP Readings from Last 3 Encounters:  02/22/21 130/66  08/16/20 130/68  02/20/20 (!) 144/72   Pulse Readings from Last 3 Encounters:  02/22/21 75  08/16/20 70  02/20/20 69    In general this is a well appearing African American female in no acute distress. She's alert and oriented x4 and appropriate throughout the examination. Cardiopulmonary assessment is negative for acute distress and she exhibits normal effort. Bilateral breast exam is deferred.    ECOG = 0  0 - Asymptomatic (Fully active, able to carry on all predisease activities without restriction)  1 - Symptomatic but completely ambulatory (Restricted in physically strenuous activity but ambulatory and able to carry out work of a light or sedentary nature. For example, light housework, office work)  2 - Symptomatic, <50% in bed during the day (Ambulatory and capable of all self care but unable to carry out any work activities. Up and about more than 50% of waking hours)  3 - Symptomatic, >50% in bed, but not bedbound (Capable of only limited self-care, confined to bed or chair 50% or more of waking  hours)  4 - Bedbound (Completely disabled. Cannot carry on any self-care. Totally confined to bed or chair)  5 - Death   Eustace Pen MM, Creech RH, Tormey DC, et al. [phone]). "Toxicity and response criteria of the [facility]". [place] Oncol. 5 (6): 649-55    LABORATORY DATA:  Lab Results  Component Value Date   WBC 5.3 04/20/2021   HGB 13.4 04/20/2021   HCT 40.8 04/20/2021   MCV 89.7 04/20/2021   PLT 286 04/20/2021   Lab Results  Component Value Date   NA 135 02/22/2021   K 4.8 02/22/2021   CL 99 02/22/2021   CO2 29 02/22/2021   Lab Results  Component Value Date   ALT 17 02/22/2021   AST 17 02/22/2021  ALKPHOS 92 02/22/2021   BILITOT 0.5 02/22/2021      RADIOGRAPHY: No results found.     IMPRESSION/PLAN: 1. Stage IA, cT1bN0M0 grade 1, ER/PR positive invasive ductal carcinoma of the right breast. Dr. Lisbeth Renshaw discusses the pathology findings and reviews the nature of right breast disease. The consensus from the breast conference includes breast conservation with lumpectomy. Depending on the size of the final tumor measurements rendered by pathology, the tumor may be tested for Oncotype Dx score to determine a role for systemic therapy. Provided that chemotherapy is not indicated, the patient's course would then be followed by external radiotherapy to the breast  to reduce risks of local recurrence followed by antiestrogen therapy. Dr. Lisbeth Renshaw also discusses the favorable scenarios for which radiation may be optional but this would depend on her final pathology results. We discussed the risks, benefits, short, and long term effects of radiotherapy, as well as the curative intent, and the patient is interested in proceeding. Dr. Lisbeth Renshaw discusses the delivery and logistics of radiotherapy and anticipates a course of 4 or up to 6 1/2 weeks of radiotherapy. We will see her back a few weeks after surgery to discuss the simulation process and anticipate we starting  radiotherapy about 4-6 weeks after surgery.     In a visit lasting 60 minutes, greater than 50% of the time was spent face to face reviewing her case, as well as in preparation of, discussing, and coordinating the patient's care.  The above documentation reflects my direct findings during this shared patient visit. Please see the separate note by Dr. Lisbeth Renshaw on this date for the remainder of the patient's plan of care.    Carola Rhine, [facility]    **Disclaimer: This note was dictated with voice recognition software. Similar sounding words can inadvertently be transcribed and this note may contain transcription errors which may not have been corrected upon publication of note.**

## 2021-04-20 NOTE — Progress Notes (Signed)
Brooke [name]   [phone] Fax:(336) [place] Note   Patient Care Team: Hoyt Koch, MD as PCP - General (Internal Medicine) Stark Klein, MD as Consulting Physician (General Surgery) Truitt Merle, MD as Consulting Physician (Hematology) Kyung Rudd, MD as Consulting Physician (Radiation Oncology) Rockwell Germany, RN as Oncology Nurse Navigator Mauro Kaufmann, RN as Oncology Nurse Navigator  Date of Service:  04/20/2021   CHIEF COMPLAINTS/PURPOSE OF CONSULTATION:  Right Breast Cancer, ER+  REFERRING PHYSICIAN:  [facility]   ASSESSMENT & PLAN:  Brooke [name] is a 75 y.o.  female   1. Malignant neoplasm of upper-inner quadrant of right breast, Stage IA, c(T1b, N0), ER+/PR+/HER2-, Grade 1 -found on screening mammogram. Right diagnostic MM and US showed 0.9 cm mass at 2 o'clock, axilla negative. Biopsy 04/07/21 confirmed IDC, grade 1. --We discussed her imaging findings and the biopsy results in great details. -Given the low grade disease, she likely need a lumpectomy. She is agreeable with that. She was seen by Dr. Barry Dienes today and likely will proceed with surgery soon.  -if her surgical path show tumor>1cm, I recommend a Oncotype Dx test on the surgical sample and we'll make a decision about adjuvant chemotherapy based on the Oncotype result. Written material of this test was given to her. She is 75 yo but overall healthy and fit, would be a candidate for chemotherapy if her Oncotype recurrence score is very high. -She was also seen by radiation oncologist Dr. Lisbeth Renshaw today. She will likely benefit from breast radiation if she undergo lumpectomy to decrease the risk of breast cancer. -Giving the strong ER and PR expression in her postmenopausal status, I recommend adjuvant endocrine therapy with aromatase inhibitor for a total of 5-10 years to reduce the risk of cancer recurrence. Potential benefits and side effects were discussed with  patient and she is interested. -We also discussed the breast cancer surveillance after her surgery. She will continue annual screening mammogram, self exam, and a routine office visit with lab and exam with [place]. -I encouraged her to have healthy diet and exercise regularly.   2. Bone Health  -I recommend obtaining repeat DEXA for new baseline.   PLAN:  -proceed with lumpectomy with Dr. Barry Dienes -oncotype depending on pathology results (if tumor>1cm). -Follow-up at the end of radiation, or sooner if needed.   Oncology History Overview Note  Cancer Staging Malignant neoplasm of upper-inner quadrant of right breast in female, estrogen receptor positive ([place]) Staging form: Breast, AJCC 8th Edition - Clinical stage from 04/07/2021: Stage IA (cT1b, cN0, cM0, G1, ER+, PR+, HER2-) - Signed by Truitt Merle, MD on 04/19/2021    Malignant neoplasm of upper-inner quadrant of right breast in female, estrogen receptor positive ([place])  03/29/2021 Mammogram   Right Diagnostic MM and Right Breast US  There is a 0.6 cm x 0.9 cm x 0.5 cm irregular mass with a spiculated margin in the right breast at 2 o'clock posterior depth 11 cm from the nipple. This irregular mass is hypoechoic with posterior acoustic shadowing. No significant abnormalities were seen sonographically in the right axilla.   04/07/2021 Cancer Staging   Staging form: Breast, AJCC 8th Edition - Clinical stage from 04/07/2021: Stage IA (cT1b, cN0, cM0, G1, ER+, PR+, HER2-) - Signed by Truitt Merle, MD on 04/19/2021 Stage prefix: Initial diagnosis Histologic grading system: 3 grade system    04/07/2021 Pathology Results   Diagnosis Breast, right, needle core biopsy, 2:00 o'clock 11cmfn mass -  INVASIVE DUCTAL CARCINOMA - FIBROADENOMATOID NODULE WITH CALCIFICATIONS - SEE COMMENT Microscopic Comment Based on the biopsy, the carcinoma appears Nottingham grade 1 of 3 and measures 0.4 cm in greatest linear extent.  PROGNOSTIC  INDICATORS Results: The tumor cells are EQUIVOCAL for Her2 (2+). Her2 by FISH will be performed and results reported separately. Estrogen Receptor: 100%, POSITIVE, STRONG STAINING INTENSITY Progesterone Receptor: 50%, POSITIVE, MODERATE STAINING INTENSITY Proliferation Marker Ki67: 2%  FLUORESCENCE IN-SITU HYBRIDIZATION Results: GROUP 4: HER2 **NEGATIVE**   04/15/2021 Initial Diagnosis   Malignant neoplasm of upper-inner quadrant of right breast in female, estrogen receptor positive ([place])      HISTORY OF PRESENTING ILLNESS:  Brooke [name] 75 y.o. female is a here because of breast cancer. The patient was referred by [facility]. The patient presents to the clinic today accompanied by her daughter.   She had routine screening mammography on 03/24/21 showing a possible abnormality in the right breast. She underwent right diagnostic mammography and right breast ultrasonography on 03/29/21 showing: 0.9 cm irregular mass at 2 o'clock, no significant right axilla abnormalities.  Biopsy on 04/07/21 showed: invasive ductal carcinoma, grade 1; fibroadenomatoid nodule with calcifications. Prognostic indicators significant for: estrogen receptor, 100% positive and progesterone receptor, 50% positive. Proliferation marker Ki67 at 2%. HER2 negative by FISH.   Today the patient notes they felt/feeling prior/after... -she has done well overall and currently has no complaints   She has a PMHx of.... -left breast fibroadenomas: 2001, 2013 -s/p cholecystectomy 1974 -s/p hysterectomy with unilateral SO in 1979 for a ruptured ovary -DM -sacoidosis   Socially... -she lives alone -she has one daughter, who lives in [place]. -no family history of cancer, though she does not know about her father's side.   GYN HISTORY  Menarchal: 75 years old LMP: with hysterectomy Contraceptive: used ~10 years HRT: never used GP: 1, at age 73    REVIEW OF SYSTEMS:    Constitutional: Denies fevers, chills  or abnormal night sweats Eyes: Denies blurriness of vision, double vision or watery eyes Ears, nose, mouth, throat, and face: Denies mucositis or sore throat Respiratory: Denies cough, dyspnea or wheezes Cardiovascular: Denies palpitation, chest discomfort or lower extremity swelling Gastrointestinal:  Denies nausea, heartburn or change in bowel habits Skin: Denies abnormal skin rashes Lymphatics: Denies new lymphadenopathy or easy bruising Neurological:Denies numbness, tingling or new weaknesses Behavioral/Psych: Mood is stable, no new changes  All other systems were reviewed with the patient and are negative.   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    Pleural effusion    Sarcoidosis     SURGICAL HISTORY: Past Surgical History:  Procedure Laterality Date   ABDOMINAL HYSTERECTOMY     CHOLECYSTECTOMY  06/19/1973   COLONOSCOPY     ESOPHAGOGASTRODUODENOSCOPY     ESOPHAGOGASTRODUODENOSCOPY N/A 08/09/2014   Procedure: ESOPHAGOGASTRODUODENOSCOPY (EGD);  Surgeon: Gatha Mayer, MD;  Location: [facility] ENDOSCOPY;  Service: Endoscopy;  Laterality: N/A;   FOOT SURGERY  06/20/2011   left    SOCIAL HISTORY: Social History   Socioeconomic History   Marital status: Single    Spouse name: Not on  file   Number of children: Not on file   Years of education: Not on file   Highest education level: Not on file  Occupational History   Not on file  Tobacco Use   Smoking status: Former    Packs/day: 0.03    Years: 15.00    Pack years: 0.45    Types: Cigarettes    Quit date: 06/19/1982     Years since quitting: 38.8   Smokeless tobacco: Never  Substance and Sexual Activity   Alcohol use: No   Drug use: No   Sexual activity: Not on file  Other Topics Concern   Not on file  Social History Narrative   Not on file   Social Determinants of Health   Financial Resource Strain: Low Risk    Difficulty of Paying Living Expenses: Not very hard  Food Insecurity: No Food Insecurity   Worried About Running Out of Food in the Last Year: Never true   Ran Out of Food in the Last Year: Never true  Transportation Needs: No Transportation Needs   Lack of Transportation (Medical): No   Lack of Transportation (Non-Medical): No  Physical Activity: Not on file  Stress: Not on file  Social Connections: Not on file  Intimate Partner Violence: Not on file    FAMILY HISTORY: Family History  Problem Relation Age of Onset   Hypertension Mother    Colon cancer Neg Hx     ALLERGIES:  is allergic to codeine.  MEDICATIONS:  Current Outpatient Medications  Medication Sig Dispense Refill   albuterol (VENTOLIN HFA) 108 (90 Base) MCG/ACT inhaler INHALE 2 PUFFS BY MOUTH EVERY 6 HOURS AS NEEDED 25.5 g 11   ALPRAZolam (XANAX) 0.5 MG tablet TAKE 1/2 TABLET(0.25 MG) BY MOUTH DAILY AS NEEDED FOR ANXIETY 30 tablet 0   aspirin 81 MG tablet Take 81 mg by mouth daily.     atorvastatin (LIPITOR) 40 MG tablet TAKE 1 TABLET(40 MG) BY MOUTH DAILY 90 tablet 1   BD PEN NEEDLE NANO 2ND GEN 32G X 4 MM MISC USE TO INJECT INSULIN AS DIRECTED 100 each 2   blood glucose meter kit and supplies Dispense based on patient and insurance preference. Use up to four times daily as directed. (FOR ICD-10 E10.9, E11.9). 1 each 0   dapagliflozin propanediol (FARXIGA) 10 MG TABS tablet Take 1 tablet (10 mg total) by mouth daily. 90 tablet 1   escitalopram (LEXAPRO) 10 MG tablet TAKE 1 TABLET(10 MG) BY MOUTH DAILY 90 tablet 3   glimepiride (AMARYL) 2 MG tablet Take 1 tablet (2 mg total) by mouth daily with breakfast. Overdue  for Annual appt due in must see provider for future refills 60 tablet 0   LANTUS SOLOSTAR 100 UNIT/ML Solostar Pen ADMINISTER 10 TO 25 UNITS UNDER THE SKIN DAILY 15 mL 2   latanoprost (XALATAN) 0.005 % ophthalmic solution 1 drop in both eyes at bedtime  0   losartan (COZAAR) 100 MG tablet TAKE 1 TABLET BY MOUTH EVERY DAY 90 tablet 1   ONETOUCH ULTRA test strip TEST FOUR TIMES DAILY AS DIRECTED 100 strip 2   No current facility-administered medications for this visit.    PHYSICAL EXAMINATION: ECOG PERFORMANCE STATUS: 0 - Asymptomatic  Vitals:   04/20/21 0907  BP: (!) 176/82  Pulse: 72  Resp: 18  Temp: (!) 97 F (36.1 C)  SpO2: 100%   Filed Weights   04/20/21 0907  Weight: 148 lb 11.2 oz (67.4 kg)    GENERAL:alert,  no distress and comfortable SKIN: skin color, texture, turgor are normal, no rashes or significant lesions EYES: normal, Conjunctiva are pink and non-injected, sclera clear  NECK: supple, thyroid normal size, non-tender, without nodularity LYMPH:  no palpable lymphadenopathy in the cervical, axillary  LUNGS: clear to auscultation and percussion with normal breathing effort HEART: regular rate & rhythm and no murmurs and no lower extremity edema ABDOMEN:abdomen soft, non-tender and normal bowel sounds Musculoskeletal:no cyanosis of digits and no clubbing  NEURO: alert & oriented x 3 with fluent speech, no focal motor/sensory deficits BREAST: No palpable mass, nodules or adenopathy bilaterally.   LABORATORY DATA:  I have reviewed the data as listed CBC Latest Ref Rng & Units 04/20/2021 02/22/2021 01/13/2020  WBC 4.0 - 10.5 K/uL 5.3 5.6 6.5  Hemoglobin 12.0 - 15.0 g/dL 13.4 14.2 13.2  Hematocrit 36.0 - 46.0 % 40.8 43.6 40.0  Platelets 150 - 400 K/uL 286 302.0 297    CMP Latest Ref Rng & Units 04/20/2021 02/22/2021 08/16/2020  Glucose 70 - 99 mg/dL 154(H) 141(H) 121(H)  BUN 8 - 23 mg/dL _0 Creatinine 0.44 - 1.00 mg/dL 0.92 0.88 0.90  Sodium 135 - 145 mmol/L 139  135 138  Potassium 3.5 - 5.1 mmol/L 4.7 4.8 4.8  Chloride 98 - 111 mmol/L 104 99 101  CO2 22 - 32 mmol/L _1 Calcium 8.9 - 10.3 mg/dL 8.8(L) 9.5 9.4  Total Protein 6.5 - 8.1 g/dL 7.0 7.4 7.2  Total Bilirubin 0.3 - 1.2 mg/dL 0.4 0.5 0.5  Alkaline Phos 38 - 126 U/L 103 92 88  AST 15 - 41 U/L _2 ALT 0 - 44 U/L _3 RADIOGRAPHIC STUDIES: I have personally reviewed the radiological images as listed and agreed with the findings in the report. No results found.   No orders of the defined types were placed in this encounter.   All questions were answered. The patient knows to call the clinic with any problems, questions or concerns. The total time spent in the appointment was 50 minutes.     Truitt Merle, MD 04/20/2021 10:42 PM  I, Wilburn Mylar, am acting as scribe for Truitt Merle, MD.   I have reviewed the above documentation for accuracy and completeness, and I agree with the above.

## 2021-04-20 NOTE — Progress Notes (Signed)
[place] Clinical Social Work  Initial Assessment   Brooke [name] is a 75 y.o. year old female accompanied by daughter. Clinical Social Work was referred by [facility] for assessment of psychosocial needs.   SDOH (Social Determinants of Health) assessments performed: Yes SDOH Interventions    Flowsheet Row Most Recent Value  SDOH Interventions   Food Insecurity Interventions Intervention Not Indicated  Financial Strain Interventions Intervention Not Indicated  Housing Interventions Intervention Not Indicated  Transportation Interventions Intervention Not Indicated       Distress Screen completed: Yes ONCBCN DISTRESS SCREENING 04/20/2021  Screening Type Initial Screening  Distress experienced in past week (1-10) 2  Emotional problem type Nervousness/Anxiety;Adjusting to illness  Information Concerns Type Lack of info about treatment  Physical Problem type Sleep/insomnia      Family/Social Information:  Housing Arrangement: patient lives alone Family members/support persons in your life? Family, [place], and [employer] concerns: no  Employment: Retired. Income source: Paediatric nurse concerns: No Type of concern: None Food access concerns: no Religious or spiritual practice: yes, pt attends church Medication Concerns: no  Services Currently in place:  n/a  Coping/ Adjustment to diagnosis: Patient understands treatment plan and what happens next? yes Concerns about diagnosis and/or treatment: I'm not especially worried about anything Patient reported stressors: Anxiety and Adjusting to my illness Hopes and priorities: "To live my best life" and continue volunteering, being an active member of my community. Patient enjoys  volunteering and attending church Current coping skills/ strengths: Ability for insight , Religious Affiliation , Special hobby/interest , and Supportive family/friends     SUMMARY: Current SDOH Barriers:  None identified at  this time   Interventions: Discussed common feeling and emotions when being diagnosed with cancer, and the importance of support during treatment Informed patient of the support team roles and support services at [facility] Provided CSW contact information and encouraged patient to call with any questions or concerns   Follow Up Plan: Patient will contact CSW with any support or resource needs Patient verbalizes understanding of plan: Yes   Rosary Lively, Social Work Intern Supervised by [facility] , LCSW

## 2021-04-21 ENCOUNTER — Other Ambulatory Visit: Payer: Self-pay | Admitting: Internal Medicine

## 2021-04-25 NOTE — Progress Notes (Signed)
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 History pleural effusion, complicated by DVT, allergic rhinitis, DM2 Office Spirometry 10/26/16-mild restriction of exhaled volume. FVC 1.62/75%, FEV1 1.28/77%, ratio 0.79, FEF 25-75% 1.24/82% PFT 02/20/20- mild DLCO deficit ------------------------------------------------------------------------------------  01/21/2019-  75 year old female former smoker previously followed for atypical/seronegative Sarcoid with bilateral lower zone lung volume loss, peripheral neuropathy. History pleural effusion, DVT, allergic rhinitis, DM2 Glaucoma,  ProAir hfa ----- pt states breathing varies and is at baseline More DOE several months, unsure if seasonal. No acute infection. Lost sense of taste and smell several years ago, unrelated to Covid. She is being Covid careful- discussed. Rescue inhaler 1x/ day.    04/26/21- 75 year old female former smoker previously followed for atypical/seronegative Sarcoid with bilateral lower zone lung volume loss, peripheral neuropathy. Complicated by  pleural effusion, DVT, allergic rhinitis, DM2, HTN,, Hyperlipidemia, R Breast Cancer, Anxiety/ Depression,  -albuterol hfa,  Covid vax-5 Phizer Flu vax-had Pending lumpectomy -----Patient has recently been diagnosed with breast cancer, feels like breathing is good. Breathing stable with little routine cough or wheeze. Chronic mild DOE- not worse.  We reviewed CXR. Shallow inspiration with prominent markings. Discussed relation to her previously identified "shrinking lung" pattern attributed in past to possible seronegative Sarcoid. We idscussed and will get HRCT for clear documentation before she starts Rx for breast cancer. Albuterol seems to help dyspnea some.  PFT 02/20/20- mild DLCO deficit CXR 02/20/20-  IMPRESSION: 1. Diminishing pulmonary insufflation. 2. Stable bibasilar and left  apical scarring.   ROS-see HPI   + = positive Constitutional:   No-   weight loss, +night sweats, fevers, chills, fatigue, lassitude. HEENT:   No-  headaches, difficulty swallowing, tooth/dental problems, sore throat,       No-  sneezing, itching, ear ache, +nasal congestion, post nasal drip,  CV:  No-   chest pain, orthopnea, PND, swelling in lower extremities, anasarca,                                                                              dizziness, palpitations Resp: +shortness of breath with exertion or at rest.              No-   productive cough,  No non-productive cough,  No- coughing up of blood.              No-   change in color of mucus.  No- wheezing.   Skin: No-   rash or lesions. GI:  No-   heartburn, indigestion, abdominal pain, nausea, vomiting,  GU: . MS:  No-   joint pain or swelling.  . Neuro-     nothing unusual Psych:  No- change in mood or affect. No depression or anxiety.  No memory loss.  OBJ- Physical Exam      General- Alert, Oriented, Affect-appropriate, Distress- none acute. +Looks well. Skin- rash-none, lesions- none, excoriation- none Lymphadenopathy- none Head- atraumatic            Eyes- Gross vision intact, PERRLA, conjunctivae and secretions clear            Ears- Hearing, canals-normal  Nose- Clear, no-Septal dev, mucus, polyps, erosion, perforation             Throat- Mallampati II , mucosa clear , drainage- none, tonsils- atrophic Neck- flexible , trachea midline, no stridor , thyroid nl, carotid no bruit Chest - symmetrical excursion , unlabored           Heart/CV- RRR , no murmur , no gallop  , no rub, nl s1 s2                           - JVD- none , edema- none, stasis changes- none, varices- none           Lung-, Clear/+ decreased in bases, wheeze- none, cough- none ,   dullness-none, rub- none            Chest wall-  Abd-  Br/ Gen/ Rectal- Not done, not indicated Extrem- cyanosis- none, clubbing, none, atrophy- none,  strength- nl Neuro- grossly intact to observation

## 2021-04-26 ENCOUNTER — Ambulatory Visit: Payer: Federal, State, Local not specified - PPO | Admitting: Internal Medicine

## 2021-04-26 ENCOUNTER — Other Ambulatory Visit: Payer: Self-pay

## 2021-04-26 ENCOUNTER — Encounter: Payer: Self-pay | Admitting: Internal Medicine

## 2021-04-26 VITALS — BP 130/72 | HR 70 | Temp 98.3°F | Ht 63.0 in | Wt 151.0 lb

## 2021-04-26 DIAGNOSIS — D869 Sarcoidosis, unspecified: Secondary | ICD-10-CM

## 2021-04-26 DIAGNOSIS — J849 Interstitial pulmonary disease, unspecified: Secondary | ICD-10-CM | POA: Diagnosis not present

## 2021-04-26 MED ORDER — ALBUTEROL SULFATE HFA 108 (90 BASE) MCG/ACT IN AERS: INHALATION_SPRAY | RESPIRATORY_TRACT | 11 refills | Status: DC

## 2021-04-26 NOTE — Patient Instructions (Addendum)
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

## 2021-04-26 NOTE — Assessment & Plan Note (Signed)
She is past the age when we would expect active sarcoid.

## 2021-04-27 NOTE — Progress Notes (Signed)
Surgical Instructions    Your procedure is scheduled on 05/05/21.  Report to [facility] Main Entrance "A" at 7:00 A.M., then check in with the Admitting office.  Call this number if you have problems the morning of surgery:  [phone]   If you have any questions prior to your surgery date call [phone]: Open Monday-Friday 8am-4pm    Remember:  Do not eat after midnight the night before your surgery  You may drink clear liquids until 6:00am the morning of your surgery.   Clear liquids allowed are: Water, Non-Citrus Juices (without pulp), Carbonated Beverages, Clear Tea, Black Coffee ONLY (NO MILK, CREAM OR POWDERED CREAMER of any kind), and Gatorade  Patient Instructions  The night before surgery:  No food after midnight. ONLY clear liquids after midnight   The day of surgery (if you have diabetes): Drink ONE (1) 12 oz G2 given to you in your pre admission testing appointment by 6:00am the morning of surgery. Drink in one sitting. Do not sip.  This drink was given to you during your hospital  pre-op appointment visit.  Nothing else to drink after completing the  12 oz bottle of G2.         If you have questions, please contact your surgeon's office.     Take these medicines the morning of surgery with A SIP OF WATER  atorvastatin (LIPITOR) escitalopram (LEXAPRO)  IF NEEDED: albuterol (VENTOLIN HFA) inhaler if needed (bring with you the day of surgery) ALPRAZolam (XANAX)  carboxymethylcellulose (REFRESH PLUS) eye drops  As of today, STOP taking any Aspirin (unless otherwise instructed by your surgeon) Aleve, Naproxen, Ibuprofen, Motrin, Advil, Goody's, BC's, all herbal medications, fish oil, and all vitamins.  WHAT DO I DO ABOUT MY DIABETES MEDICATION?   Do not take oral diabetes medicines (pills) the morning of surgery.  THE DAY BEFORE SURGERY, do not take dapagliflozin propanediol (FARXIGA).   THE NIGHT BEFORE SURGERY, take 10 units (50% of your normal dose)  of LANTUS SOLOSTAR.  THE MORNING OF SURGERY, do not take dapagliflozin propanediol (FARXIGA) or glimepiride (AMARYL).   The day of surgery, do not take other diabetes injectables, including Byetta (exenatide), Bydureon (exenatide ER), Victoza (liraglutide), or Trulicity (dulaglutide).  If your CBG is greater than 220 mg/dL, you may take  of your sliding scale (correction) dose of insulin.   HOW TO MANAGE YOUR DIABETES BEFORE AND AFTER SURGERY  Why is it important to control my blood sugar before and after surgery? Improving blood sugar levels before and after surgery helps healing and can limit problems. A way of improving blood sugar control is eating a healthy diet by:  Eating less sugar and carbohydrates  Increasing activity/exercise  Talking with your doctor about reaching your blood sugar goals High blood sugars (greater than 180 mg/dL) can raise your risk of infections and slow your recovery, so you will need to focus on controlling your diabetes during the weeks before surgery. Make sure that the doctor who takes care of your diabetes knows about your planned surgery including the date and location.  How do I manage my blood sugar before surgery? Check your blood sugar at least 4 times a day, starting 2 days before surgery, to make sure that the level is not too high or low.  Check your blood sugar the morning of your surgery when you wake up and every 2 hours until you get to the Short Stay unit.  If your blood sugar is less than 70 mg/dL, you  will need to treat for low blood sugar: Do not take insulin. Treat a low blood sugar (less than 70 mg/dL) with  cup of clear juice (cranberry or apple), 4 glucose tablets, OR glucose gel. Recheck blood sugar in 15 minutes after treatment (to make sure it is greater than 70 mg/dL). If your blood sugar is not greater than 70 mg/dL on recheck, call [phone] for further instructions. Report your blood sugar to the short stay nurse when  you get to Short Stay.  If you are admitted to the hospital after surgery: Your blood sugar will be checked by the staff and you will probably be given insulin after surgery (instead of oral diabetes medicines) to make sure you have good blood sugar levels. The goal for blood sugar control after surgery is 80-180 mg/dL.    After your COVID test   You are not required to quarantine however you are required to wear a well-fitting mask when you are out and around people not in your household.  If your mask becomes wet or soiled, replace with a new one.  Wash your hands often with soap and water for 20 seconds or clean your hands with an alcohol-based hand sanitizer that contains at least 60% alcohol.  Do not share personal items.  Notify your provider: if you are in close contact with someone who has COVID  or if you develop a fever of 100.4 or greater, sneezing, cough, sore throat, shortness of breath or body aches.             Do not wear jewelry or makeup Do not wear lotions, powders, perfumes/colognes, or deodorant. Do not shave 48 hours prior to surgery.   Do not bring valuables to the hospital.  DO Not wear nail polish, gel polish, artificial nails, or any other type of covering on natural nails including finger and toenails. If patients have artificial nails, gel coating, etc. that need to be removed by a nail salon, please have this removed prior to surgery or surgery may need to be canceled/delayed if the surgeon/ anesthesia feels like the patient is unable to be adequately monitored.             [facility] is not responsible for any belongings or valuables.  Do NOT Smoke (Tobacco/Vaping)  24 hours prior to your procedure  If you use a CPAP at night, you may bring your mask for your overnight stay.   Contacts, glasses, hearing aids, dentures or partials may not be worn into surgery, please bring cases for these belongings   For patients admitted to the hospital,  discharge time will be determined by your treatment team.   Patients discharged the day of surgery will not be allowed to drive home, and someone needs to stay with them for 24 hours.  NO VISITORS WILL BE ALLOWED IN PRE-OP WHERE PATIENTS ARE PREPPED FOR SURGERY.  ONLY 1 SUPPORT PERSON MAY BE PRESENT IN THE WAITING ROOM WHILE YOU ARE IN SURGERY.  IF YOU ARE TO BE ADMITTED, ONCE YOU ARE IN YOUR ROOM YOU WILL BE ALLOWED TWO (2) VISITORS. 1 (ONE) VISITOR MAY STAY OVERNIGHT BUT MUST ARRIVE TO THE ROOM BY 8pm.  Minor children may have two parents present. Special consideration for safety and communication needs will be reviewed on a case by case basis.  Special instructions:    Oral Hygiene is also important to reduce your risk of infection.  Remember - BRUSH YOUR TEETH THE MORNING OF SURGERY WITH YOUR  REGULAR TOOTHPASTE   - Preparing For Surgery  Before surgery, you can play an important role. Because skin is not sterile, your skin needs to be as free of germs as possible. You can reduce the number of germs on your skin by washing with CHG (chlorahexidine gluconate) Soap before surgery.  CHG is an antiseptic cleaner which kills germs and bonds with the skin to continue killing germs even after washing.     Please do not use if you have an allergy to CHG or antibacterial soaps. If your skin becomes reddened/irritated stop using the CHG.  Do not shave (including legs and underarms) for at least 48 hours prior to first CHG shower. It is OK to shave your face.  Please follow these instructions carefully.     Shower the NIGHT BEFORE SURGERY and the MORNING OF SURGERY with CHG Soap.   If you chose to wash your hair, wash your hair first as usual with your normal shampoo. After you shampoo, rinse your hair and body thoroughly to remove the shampoo.  Then [employer] and genitals (private parts) with your normal soap and rinse thoroughly to remove soap.  After that Use CHG Soap as you would any  other liquid soap. You can apply CHG directly to the skin and wash gently with a scrungie or a clean washcloth.   Apply the CHG Soap to your body ONLY FROM THE NECK DOWN.  Do not use on open wounds or open sores. Avoid contact with your eyes, ears, mouth and genitals (private parts). Wash Face and genitals (private parts)  with your normal soap.   Wash thoroughly, paying special attention to the area where your surgery will be performed.  Thoroughly rinse your body with warm water from the neck down.  DO NOT shower/wash with your normal soap after using and rinsing off the CHG Soap.  Pat yourself dry with a CLEAN TOWEL.  Wear CLEAN PAJAMAS to bed the night before surgery  Place CLEAN SHEETS on your bed the night before your surgery  DO NOT SLEEP WITH PETS.   Day of Surgery: Take a shower with CHG soap. Wear Clean/Comfortable clothing the morning of surgery Do not apply any deodorants/lotions.   Remember to brush your teeth WITH YOUR REGULAR TOOTHPASTE.   Please read over the following fact sheets that you were given.

## 2021-04-28 ENCOUNTER — Encounter (HOSPITAL_COMMUNITY): Payer: Self-pay

## 2021-04-28 ENCOUNTER — Encounter (HOSPITAL_COMMUNITY)
Admission: RE | Admit: 2021-04-28 | Discharge: 2021-04-28 | Disposition: A | Discharge status: Home / Self Care | Payer: Federal, State, Local not specified - PPO | Source: Ambulatory Visit | Attending: General Surgery | Admitting: General Surgery

## 2021-04-28 ENCOUNTER — Telehealth: Payer: Self-pay | Admitting: *Deleted

## 2021-04-28 ENCOUNTER — Encounter: Payer: Self-pay | Admitting: *Deleted

## 2021-04-28 ENCOUNTER — Other Ambulatory Visit: Payer: Self-pay

## 2021-04-28 DIAGNOSIS — R0609 Other forms of dyspnea: Secondary | ICD-10-CM | POA: Insufficient documentation

## 2021-04-28 DIAGNOSIS — Z79899 Other long term (current) drug therapy: Secondary | ICD-10-CM | POA: Insufficient documentation

## 2021-04-28 DIAGNOSIS — E1165 Type 2 diabetes mellitus with hyperglycemia: Secondary | ICD-10-CM | POA: Diagnosis not present

## 2021-04-28 DIAGNOSIS — D869 Sarcoidosis, unspecified: Secondary | ICD-10-CM | POA: Insufficient documentation

## 2021-04-28 DIAGNOSIS — C50919 Malignant neoplasm of unspecified site of unspecified female breast: Secondary | ICD-10-CM | POA: Diagnosis not present

## 2021-04-28 DIAGNOSIS — Z01818 Encounter for other preprocedural examination: Secondary | ICD-10-CM | POA: Diagnosis not present

## 2021-04-28 HISTORY — DX: Anxiety disorder, unspecified: F41.9

## 2021-04-28 LAB — GLUCOSE, CAPILLARY: Glucose-Capillary: 147 mg/dL — ABNORMAL HIGH (ref 70–99)

## 2021-04-28 NOTE — Telephone Encounter (Signed)
Spoke with patient to follow up from [facility] 11/2 and assess navigation needs. Patient denies any questions or concerns at this time. Encouraged her to call should anything arise. Patient verbalized understanding.

## 2021-04-28 NOTE — Progress Notes (Addendum)
PCP: Pricilla Holm, MD Cardiologist: denies  EKG: 04/28/21 CXR: na ECHO: denies Stress Test: denies Cardiac Cath: denies  Fasting Blood Sugar- 117-140 Checks Blood Sugar___2  times a day  OSA/CPAP: No  ASA/Blood Thinner: No  Covid test not needed  Anesthesia Review: Yes, sarcadosis with bilat lower zone lung volume loss.  Borderline EKG  Patient denies shortness of breath, fever, cough, and chest pain at PAT appointment.  Patient verbalized understanding of instructions provided today at the PAT appointment.  Patient asked to review instructions at home and day of surgery.

## 2021-04-29 ENCOUNTER — Encounter: Payer: Self-pay | Admitting: Hematology

## 2021-04-29 NOTE — Progress Notes (Signed)
Anesthesia Chart Review:  Follows with pulmonologist Dr. Annamaria Boots for history of sarcoidosis.  Last seen 04/26/2021.  Per note, "Pending lumpectomy -----Patient has recently been diagnosed with breast cancer, feels like breathing is good. Breathing stable with little routine cough or wheeze. Chronic mild DOE- not worse.  We reviewed CXR. Shallow inspiration with prominent markings. Discussed relation to her previously identified "shrinking lung" pattern attributed in past to possible seronegative Sarcoid. We idscussed and will get HRCT for clear documentation before she starts Rx for breast cancer. Albuterol seems to help dyspnea some. PFT 02/20/20- mild DLCO deficit."  IDDM2 not well controlled, last A1c 9.2 on 02/22/2021.  Preop labs reviewed, unremarkable.  EKG 04/28/2021: NSR.  Rate 68.  CHEST - 2 VIEW 02/20/20: COMPARISON:  07/17/2017   FINDINGS: Lung volumes are small and pulmonary insufflation has decreased since prior examination. Bibasilar and left apical scarring appears stable, better assessed on CT examination of 04/18/2005. No new focal pulmonary infiltrates. No pneumothorax or pleural effusion. Cardiac size within normal limits. Pulmonary vascularity is normal. No acute bone abnormality.   IMPRESSION: 1. Diminishing pulmonary insufflation. 2. Stable bibasilar and left apical scarring.     Wynonia Musty [facility] Short Stay Center/Anesthesiology Phone [phone] 04/29/2021 4:38 PM

## 2021-04-29 NOTE — Anesthesia Preprocedure Evaluation (Addendum)
Anesthesia Evaluation  Patient identified by MRN, date of birth, ID band Patient awake    Reviewed: Allergy & Precautions, NPO status , Patient's Chart, lab work & pertinent test results  Airway Mallampati: I  TM Distance: >3 FB Neck ROM: Full    Dental no notable dental hx. (+) Teeth Intact, Dental Advisory Given   Pulmonary neg pulmonary ROS, former smoker,  sarcoidosis   Pulmonary exam normal breath sounds clear to auscultation       Cardiovascular hypertension, Pt. on medications + DVT  Normal cardiovascular exam Rhythm:Regular Rate:Normal     Neuro/Psych PSYCHIATRIC DISORDERS Anxiety negative neurological ROS     GI/Hepatic Neg liver ROS, PUD,   Endo/Other  diabetes, Type 2, Oral Hypoglycemic Agents  Renal/GU negative Renal ROS  negative genitourinary   Musculoskeletal negative musculoskeletal ROS (+)   Abdominal   Peds  Hematology negative hematology ROS (+)   Anesthesia Other Findings Right breast CA  Reproductive/Obstetrics                           Anesthesia Physical Anesthesia Plan  ASA: 3  Anesthesia Plan: General   Post-op Pain Management:    Induction: Intravenous  PONV Risk Score and Plan: 3 and Ondansetron, Dexamethasone and Treatment may vary due to age or medical condition  Airway Management Planned: LMA  Additional Equipment:   Intra-op Plan:   Post-operative Plan: Extubation in OR  Informed Consent: I have reviewed the patients History and Physical, chart, labs and discussed the procedure including the risks, benefits and alternatives for the proposed anesthesia with the patient or authorized representative who has indicated his/her understanding and acceptance.     Dental advisory given  Plan Discussed with: CRNA  Anesthesia Plan Comments: (PAT note by Karoline Caldwell, PA-C: Follows with pulmonologist Dr. Annamaria Boots for history of sarcoidosis.  Last seen  04/26/2021.  Per note, "Pending lumpectomy -----Patient has recently been diagnosed with breast cancer, feels like breathing is good. Breathing stable with little routine cough or wheeze. Chronic mild DOE- not worse.  We reviewed CXR. Shallow inspiration with prominent markings. Discussed relation to her previously identified "shrinking lung" pattern attributed in past to possible seronegative Sarcoid. We idscussed and will get HRCT for clear documentation before she starts Rx for breast cancer. Albuterol seems to help dyspnea some.PFT 02/20/20- mild DLCO deficit."  IDDM2 not well controlled, last A1c 9.2 on 02/22/2021.  Preop labs reviewed, unremarkable.  EKG 04/28/2021: NSR.  Rate 68.  CHEST - 2 VIEW 02/20/20: COMPARISON: 07/17/2017  FINDINGS: Lung volumes are small and pulmonary insufflation has decreased since prior examination. Bibasilar and left apical scarring appears stable, better assessed on CT examination of 04/18/2005. No new focal pulmonary infiltrates. No pneumothorax or pleural effusion. Cardiac size within normal limits. Pulmonary vascularity is normal. No acute bone abnormality.  IMPRESSION: 1. Diminishing pulmonary insufflation. 2. Stable bibasilar and left apical scarring. )     Anesthesia Quick Evaluation

## 2021-05-02 ENCOUNTER — Telehealth: Payer: Self-pay | Admitting: Internal Medicine

## 2021-05-02 NOTE — Telephone Encounter (Signed)
Please advise on CT and note below.

## 2021-05-02 NOTE — Telephone Encounter (Signed)
Radioactive seeds won't affect CT, but please help her reschedule her CT for a month later, just to keep things simple for her.

## 2021-05-02 NOTE — Telephone Encounter (Signed)
[facility] contacted office stating patients schedule CT Scan on 11/16 is after her seeds are planted and she has a lumpectomy on 11/17. Patient would like to know if seeds will affect the CT scan and if she should schedule the CT scan for another day.  Please contact patient with recommendations.

## 2021-05-03 NOTE — Telephone Encounter (Signed)
Will route to PCC's to help reschedule her CT for a month out.  thanks

## 2021-05-04 ENCOUNTER — Other Ambulatory Visit: Payer: Self-pay

## 2021-05-04 ENCOUNTER — Ambulatory Visit (INDEPENDENT_AMBULATORY_CARE_PROVIDER_SITE_OTHER)
Admission: RE | Admit: 2021-05-04 | Discharge: 2021-05-04 | Disposition: A | Discharge status: Home / Self Care | Payer: Federal, State, Local not specified - PPO | Source: Ambulatory Visit | Attending: Internal Medicine | Admitting: Internal Medicine

## 2021-05-04 DIAGNOSIS — J849 Interstitial pulmonary disease, unspecified: Secondary | ICD-10-CM | POA: Diagnosis not present

## 2021-05-04 NOTE — H&P (Signed)
REFERRING PHYSICIAN: Robbi Garter, MD  PROVIDER: Georgianne Fick, MD  Care Team: Patient Care Team: Charna Busman, MD as PCP - General (Internal Medicine) Barry Dienes Ballard Russell, MD as Consulting Provider (Surgical Oncology) Truitt Merle, MD (Hematology and Oncology) Marye Round, MD (Radiation Oncology)   MRN: [id number] DOB: Mar 16, 1946 DATE OF ENCOUNTER: 04/26/2021  Subjective   Chief Complaint: Breast Cancer   History of Present Illness: Brooke [name] is a 75 y.o. female who is seen today as an office consultation at the request of Dr. Truman Hayward for evaluation of Breast Cancer  Pt presented with a new right breast cancer 03/2021. She was seen to have screening detected distortion. Diagnostic imaging confirmed this. She had an 8 mm lesion in the upper inner quadrant on the left. Axilla was negative. 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   She has not previously had cancer. She has no family history of cancer. She had menarche at 25. She used hormonal contraception for around 7-8 years. She took HRT briefly, but had a blood clot and stopped it.   Diagnostic mammogram: [facility] 03/29/21 Described above. Images reviewed with radiologist.   Pathology core needle biopsy: 04/07/21 Breast, right, needle core biopsy, 2:00 o'clock 11cmfn mass - INVASIVE DUCTAL CARCINOMA - FIBROADENOMATOID NODULE WITH CALCIFICATIONS Based on the biopsy, the carcinoma appears Nottingham grade 1 of 3  Receptors: Estrogen Receptor: 100%, POSITIVE, STRONG STAINING INTENSITY Progesterone Receptor: 50%, POSITIVE, MODERATE STAINING INTENSITY Proliferation Marker Ki67: 2% GROUP 4: HER2 **NEGATIVE**  Review of Systems: A complete review of systems was obtained from the patient. I have reviewed this information and discussed as appropriate with the patient. See HPI as well for other ROS.  Review of Systems  Constitutional: Negative.  HENT:  Negative.  Respiratory: Positive for shortness of breath (w stairs).  Cardiovascular: Negative.  Gastrointestinal: Negative.  Genitourinary: Negative.  Musculoskeletal: Negative.  Skin: Negative.  Neurological: Negative.  Endo/Heme/Allergies: Negative.  Hot flashes  Psychiatric/Behavioral: The patient is nervous/anxious.    Medical History: Past Medical History:  Diagnosis Date   Diabetes mellitus without complication (CMS-HCC)   DVT (deep venous thrombosis) (CMS-HCC)   Glaucoma (increased eye pressure)   Hyperlipidemia   Hypertension   Patient Active Problem List  Diagnosis   Diabetes mellitus with glaucoma (CMS-HCC)   Hyperlipidemia associated with type 2 diabetes mellitus (CMS-HCC)   Essential hypertension   Malignant neoplasm of upper-inner quadrant of right breast in female, estrogen receptor positive (CMS-HCC)   Acute thromboembolism of deep veins of lower extremity (CMS-HCC)   History of DVT (deep vein thrombosis)   Past Surgical History:  Procedure Laterality Date   CESAREAN SECTION N/A  1975   CHOLECYSTECTOMY N/A  40 years ago per pt   HYSTERECTOMY N/A  1980's per pt    Allergies  Allergen Reactions   Codeine Nausea And Vomiting   Current Outpatient Medications on File Prior to Visit  Medication Sig Dispense Refill   albuterol 90 mcg/actuation inhaler Inhale 2 inhalations into the lungs every 6 (six) hours as needed   ALPRAZolam (XANAX) 0.5 MG tablet TAKE 1/2 TABLET(0.25 MG) BY MOUTH DAILY AS NEEDED FOR ANXIETY   aspirin 81 MG EC tablet Take 81 mg by mouth once daily   atorvastatin (LIPITOR) 40 MG tablet Take 40 mg by mouth once daily   dapagliflozin (FARXIGA) 10 mg tablet Take 1 tablet by mouth once daily   escitalopram oxalate (LEXAPRO) 10 MG tablet  Take 10 mg by mouth once daily   glimepiride (AMARYL) 2 MG tablet Take by mouth   insulin GLARGINE (LANTUS SOLOSTAR U-100 INSULIN) pen injector (concentration 100 units/mL) ADMINISTER 10 TO 25 UNITS UNDER  THE SKIN DAILY   losartan (COZAAR) 100 MG tablet Take 1 tablet by mouth once daily   No current facility-administered medications on file prior to visit.   Family History  Problem Relation Age of Onset   Stroke Mother   High blood pressure (Hypertension) Mother    Social History   Tobacco Use  Smoking Status Former   Types: Cigarettes  Smokeless Tobacco Never  Tobacco Comments  Quit smoking 30 years ago per patient    Social History   Socioeconomic History   Marital status: Unknown  Tobacco Use   Smoking status: Former  Types: Cigarettes   Smokeless tobacco: Never   Tobacco comments:  Quit smoking 30 years ago per patient  Vaping Use   Vaping Use: Never used  Substance and Sexual Activity   Alcohol use: Yes  Comment: "wine every now and then"   Drug use: Never   Objective:   Vitals:  04/26/21 1623  BP: (!) 176/82  Pulse: 72  Temp: 36.1 C (97 F)  Weight: 67.4 kg (148 lb 11.2 oz)  Height: 160 cm ($Remov'5\' 3"'bSSGsI$ )   Body mass index is 26.34 kg/m.  Gen: No acute distress. Well nourished and well groomed.  Neurological: Alert and oriented to person, place, and time. Coordination normal.  Head: Normocephalic and atraumatic.  Eyes: Conjunctivae are normal. Pupils are equal, round, and reactive to light. No scleral icterus.  Neck: Normal range of motion. Neck supple. No tracheal deviation or thyromegaly present.  Cardiovascular: Normal rate, regular rhythm, normal heart sounds and intact distal pulses. Exam reveals no gallop and no friction rub. No murmur heard. Breast: Breasts are ptotic and symmetric bilaterally. No palpable masses. + hematoma at biopsy site. No LAD. No nipple retraction or nipple discharge.  Respiratory: Effort normal. No respiratory distress. No chest wall tenderness. Breath sounds normal. No wheezes, rales or rhonchi.  GI: Soft. Bowel sounds are normal. The abdomen is soft and nontender. There is no rebound and no guarding.  Musculoskeletal: Normal  range of motion. Extremities are nontender.  Lymphadenopathy: No cervical, preauricular, postauricular or axillary adenopathy is present Skin: Skin is warm and dry. No rash noted. No diaphoresis. No erythema. No pallor. No clubbing, cyanosis, or edema.  Psychiatric: Normal mood and affect. Behavior is normal. Judgment and thought content normal.   Labs CBC, CMET essentially normal 04/20/21 other than glucose of 154  Assessment and Plan:   Malignant neoplasm of upper-inner quadrant of right breast in female, estrogen receptor positive (CMS-HCC) Pt has a new cT1bN0 right breast cancer. She is a good candidate for breast conservation. I will plan seed localized lumpectomy. I will not plan a nodal biopsy due to age and favorable prognostic panel.   Likely she will just have antihormonal tx, but may need radiation depending on final pathology.   The surgical procedure was described to the patient. I discussed the incision type and location and that we would need radiology involved on with a wire or seed marker and/or sentinel node.   The risks and benefits of the procedure were described to the patient and she wishes to proceed.   We discussed the risks bleeding, infection, damage to other structures, need for further procedures/surgeries. We discussed the risk of seroma. The patient was advised if the  area in the breast in cancer, we may need to go back to surgery for additional tissue to obtain negative margins or for a lymph node biopsy. The patient was advised that these are the most common complications, but that others can occur as well. They were advised against taking aspirin or other anti-inflammatory agents/blood thinners the week before surgery.   History of DVT (deep vein thrombosis) Provoked clot, would not prophylax other than with mobility.   No follow-ups on file.  Milus Height, MD FACS Surgical Oncology, General Surgery, Trauma and [facility]  [place]

## 2021-05-05 ENCOUNTER — Ambulatory Visit (HOSPITAL_COMMUNITY): Payer: Federal, State, Local not specified - PPO | Admitting: Physician Assistant

## 2021-05-05 ENCOUNTER — Ambulatory Visit (HOSPITAL_COMMUNITY)
Admission: RE | Admit: 2021-05-05 | Discharge: 2021-05-05 | Disposition: A | Discharge status: Home / Self Care | Payer: Federal, State, Local not specified - PPO | Attending: General Surgery | Admitting: General Surgery

## 2021-05-05 ENCOUNTER — Encounter (HOSPITAL_COMMUNITY)
Admission: RE | Disposition: A | Discharge status: Home / Self Care | Payer: Self-pay | Source: Home / Self Care | Attending: General Surgery

## 2021-05-05 ENCOUNTER — Encounter (HOSPITAL_COMMUNITY): Payer: Self-pay | Admitting: General Surgery

## 2021-05-05 ENCOUNTER — Ambulatory Visit (HOSPITAL_COMMUNITY): Payer: Federal, State, Local not specified - PPO | Admitting: Certified Registered"

## 2021-05-05 DIAGNOSIS — E1169 Type 2 diabetes mellitus with other specified complication: Secondary | ICD-10-CM | POA: Diagnosis not present

## 2021-05-05 DIAGNOSIS — C50211 Malignant neoplasm of upper-inner quadrant of right female breast: Secondary | ICD-10-CM | POA: Diagnosis present

## 2021-05-05 DIAGNOSIS — H42 Glaucoma in diseases classified elsewhere: Secondary | ICD-10-CM | POA: Insufficient documentation

## 2021-05-05 DIAGNOSIS — Z86718 Personal history of other venous thrombosis and embolism: Secondary | ICD-10-CM | POA: Diagnosis not present

## 2021-05-05 DIAGNOSIS — E785 Hyperlipidemia, unspecified: Secondary | ICD-10-CM | POA: Diagnosis not present

## 2021-05-05 DIAGNOSIS — F419 Anxiety disorder, unspecified: Secondary | ICD-10-CM | POA: Insufficient documentation

## 2021-05-05 DIAGNOSIS — Z170 Estrogen receptor positive status [ER+]: Secondary | ICD-10-CM | POA: Diagnosis not present

## 2021-05-05 DIAGNOSIS — Z79899 Other long term (current) drug therapy: Secondary | ICD-10-CM | POA: Insufficient documentation

## 2021-05-05 DIAGNOSIS — I10 Essential (primary) hypertension: Secondary | ICD-10-CM | POA: Diagnosis not present

## 2021-05-05 DIAGNOSIS — Z7984 Long term (current) use of oral hypoglycemic drugs: Secondary | ICD-10-CM | POA: Diagnosis not present

## 2021-05-05 DIAGNOSIS — Z87891 Personal history of nicotine dependence: Secondary | ICD-10-CM | POA: Insufficient documentation

## 2021-05-05 HISTORY — PX: BREAST LUMPECTOMY WITH RADIOACTIVE SEED LOCALIZATION: SHX6424

## 2021-05-05 LAB — GLUCOSE, CAPILLARY
Glucose-Capillary: 150 mg/dL — ABNORMAL HIGH (ref 70–99)
Glucose-Capillary: 143 mg/dL — ABNORMAL HIGH (ref 70–99)

## 2021-05-05 SURGERY — BREAST LUMPECTOMY WITH RADIOACTIVE SEED LOCALIZATION
Anesthesia: General | Site: Breast | Laterality: Right

## 2021-05-05 MED ORDER — ONDANSETRON HCL 4 MG/2ML IJ SOLN
INTRAMUSCULAR | Status: DC | PRN
  Administered 2021-05-05: 4 mg via INTRAVENOUS

## 2021-05-05 MED ORDER — LACTATED RINGERS IV SOLN: INTRAVENOUS | Status: DC

## 2021-05-05 MED ORDER — CHLORHEXIDINE GLUCONATE 0.12 % MT SOLN: 15.0000 mL | Freq: Once | OROMUCOSAL | Status: DC

## 2021-05-05 MED ORDER — ENSURE PRE-SURGERY PO LIQD: 296.0000 mL | Freq: Once | ORAL | Status: DC

## 2021-05-05 MED ORDER — DEXAMETHASONE SODIUM PHOSPHATE 10 MG/ML IJ SOLN
INTRAMUSCULAR | Status: DC | PRN
  Administered 2021-05-05: 5 mg via INTRAVENOUS

## 2021-05-05 MED ORDER — LIDOCAINE HCL 1 % IJ SOLN
INTRAMUSCULAR | Status: DC | PRN
  Administered 2021-05-05: 40 mL via INTRAMUSCULAR

## 2021-05-05 MED ORDER — BUPIVACAINE-EPINEPHRINE (PF) 0.25% -1:200000 IJ SOLN
INTRAMUSCULAR | Status: AC
  Filled 2021-05-05: qty 30

## 2021-05-05 MED ORDER — CEFAZOLIN SODIUM-DEXTROSE 2-4 GM/100ML-% IV SOLN
2.0000 g | INTRAVENOUS | Status: AC
  Administered 2021-05-05: 09:00:00 2 g via INTRAVENOUS
  Filled 2021-05-05: qty 100

## 2021-05-05 MED ORDER — OXYCODONE HCL 5 MG PO TABS: 2.5000 mg | ORAL_TABLET | Freq: Four times a day (QID) | ORAL | 0 refills | Status: DC | PRN

## 2021-05-05 MED ORDER — LIDOCAINE HCL (PF) 1 % IJ SOLN
INTRAMUSCULAR | Status: AC
  Filled 2021-05-05: qty 30

## 2021-05-05 MED ORDER — PHENYLEPHRINE HCL-NACL 20-0.9 MG/250ML-% IV SOLN
INTRAVENOUS | Status: DC | PRN
  Administered 2021-05-05: 30 ug/min via INTRAVENOUS

## 2021-05-05 MED ORDER — CHLORHEXIDINE GLUCONATE CLOTH 2 % EX PADS: 6.0000 | MEDICATED_PAD | Freq: Once | CUTANEOUS | Status: DC

## 2021-05-05 MED ORDER — PROPOFOL 10 MG/ML IV BOLUS
INTRAVENOUS | Status: DC | PRN
  Administered 2021-05-05: 150 mg via INTRAVENOUS

## 2021-05-05 MED ORDER — ACETAMINOPHEN 500 MG PO TABS
1000.0000 mg | ORAL_TABLET | ORAL | Status: AC
  Administered 2021-05-05: 08:00:00 1000 mg via ORAL
  Filled 2021-05-05: qty 2

## 2021-05-05 MED ORDER — FENTANYL CITRATE (PF) 100 MCG/2ML IJ SOLN: 25.0000 ug | INTRAMUSCULAR | Status: DC | PRN

## 2021-05-05 MED ORDER — PROPOFOL 10 MG/ML IV BOLUS
INTRAVENOUS | Status: AC
  Filled 2021-05-05: qty 40

## 2021-05-05 MED ORDER — FENTANYL CITRATE (PF) 250 MCG/5ML IJ SOLN
INTRAMUSCULAR | Status: DC | PRN
  Administered 2021-05-05: 25 ug via INTRAVENOUS
  Administered 2021-05-05: 50 ug via INTRAVENOUS

## 2021-05-05 MED ORDER — CHLORHEXIDINE GLUCONATE 0.12 % MT SOLN
OROMUCOSAL | Status: AC
  Administered 2021-05-05: 08:00:00 15 mL
  Filled 2021-05-05: qty 15

## 2021-05-05 MED ORDER — LIDOCAINE 2% (20 MG/ML) 5 ML SYRINGE
INTRAMUSCULAR | Status: DC | PRN
  Administered 2021-05-05: 50 mg via INTRAVENOUS

## 2021-05-05 MED ORDER — ONDANSETRON 4 MG PO TBDP: 4.0000 mg | ORAL_TABLET | Freq: Three times a day (TID) | ORAL | 0 refills | Status: DC | PRN

## 2021-05-05 MED ORDER — 0.9 % SODIUM CHLORIDE (POUR BTL) OPTIME
TOPICAL | Status: DC | PRN
  Administered 2021-05-05: 10:00:00 1000 mL

## 2021-05-05 MED ORDER — ORAL CARE MOUTH RINSE: 15.0000 mL | Freq: Once | OROMUCOSAL | Status: DC

## 2021-05-05 MED ORDER — FENTANYL CITRATE (PF) 250 MCG/5ML IJ SOLN
INTRAMUSCULAR | Status: AC
  Filled 2021-05-05: qty 5

## 2021-05-05 MED ORDER — PHENYLEPHRINE 40 MCG/ML (10ML) SYRINGE FOR IV PUSH (FOR BLOOD PRESSURE SUPPORT)
PREFILLED_SYRINGE | INTRAVENOUS | Status: DC | PRN
  Administered 2021-05-05 (×3): 120 ug via INTRAVENOUS

## 2021-05-05 SURGICAL SUPPLY — 41 items
BAG COUNTER SPONGE SURGICOUNT (BAG) ×2 IMPLANT
BINDER BREAST LRG (GAUZE/BANDAGES/DRESSINGS) ×2 IMPLANT
BINDER BREAST XLRG (GAUZE/BANDAGES/DRESSINGS) IMPLANT
BLADE SURG 10 STRL SS (BLADE) ×2 IMPLANT
CANISTER SUCT 3000ML PPV (MISCELLANEOUS) IMPLANT
CHLORAPREP W/TINT 26 (MISCELLANEOUS) ×2 IMPLANT
CLIP VESOCCLUDE LG 6/CT (CLIP) ×2 IMPLANT
CLSR STERI-STRIP ANTIMIC 1/2X4 (GAUZE/BANDAGES/DRESSINGS) ×2 IMPLANT
COVER PROBE W GEL 5X96 (DRAPES) ×2 IMPLANT
COVER SURGICAL LIGHT HANDLE (MISCELLANEOUS) ×2 IMPLANT
DERMABOND ADVANCED (GAUZE/BANDAGES/DRESSINGS) ×1
DERMABOND ADVANCED .7 DNX12 (GAUZE/BANDAGES/DRESSINGS) ×1 IMPLANT
DEVICE DUBIN SPECIMEN MAMMOGRA (MISCELLANEOUS) ×2 IMPLANT
DRAPE CHEST BREAST 15X10 FENES (DRAPES) ×2 IMPLANT
DRSG PAD ABDOMINAL 8X10 ST (GAUZE/BANDAGES/DRESSINGS) ×2 IMPLANT
ELECT COATED BLADE 2.86 ST (ELECTRODE) ×2 IMPLANT
ELECT REM PT RETURN 9FT ADLT (ELECTROSURGICAL) ×2
ELECTRODE REM PT RTRN 9FT ADLT (ELECTROSURGICAL) ×1 IMPLANT
GAUZE SPONGE 4X4 12PLY STRL (GAUZE/BANDAGES/DRESSINGS) ×2 IMPLANT
GAUZE SPONGE 4X4 12PLY STRL LF (GAUZE/BANDAGES/DRESSINGS) ×2 IMPLANT
GLOVE SURG ENC MOIS LTX SZ6 (GLOVE) ×2 IMPLANT
GLOVE SURG UNDER LTX SZ6.5 (GLOVE) ×2 IMPLANT
GOWN STRL REUS W/ TWL LRG LVL3 (GOWN DISPOSABLE) ×1 IMPLANT
GOWN STRL REUS W/TWL 2XL LVL3 (GOWN DISPOSABLE) ×2 IMPLANT
GOWN STRL REUS W/TWL LRG LVL3 (GOWN DISPOSABLE) ×2
KIT BASIN OR (CUSTOM PROCEDURE TRAY) ×2 IMPLANT
KIT MARKER MARGIN INK (KITS) ×2 IMPLANT
LIGHT WAVEGUIDE WIDE FLAT (MISCELLANEOUS) IMPLANT
NEEDLE HYPO 25GX1X1/2 BEV (NEEDLE) ×2 IMPLANT
NS IRRIG 1000ML POUR BTL (IV SOLUTION) IMPLANT
PACK GENERAL/GYN (CUSTOM PROCEDURE TRAY) ×2 IMPLANT
STRIP CLOSURE SKIN 1/2X4 (GAUZE/BANDAGES/DRESSINGS) ×2 IMPLANT
SUT MNCRL AB 4-0 PS2 18 (SUTURE) ×2 IMPLANT
SUT SILK 2 0 SH (SUTURE) IMPLANT
SUT VIC AB 2-0 SH 27 (SUTURE) ×2
SUT VIC AB 2-0 SH 27XBRD (SUTURE) ×1 IMPLANT
SUT VIC AB 3-0 SH 27 (SUTURE) ×2
SUT VIC AB 3-0 SH 27X BRD (SUTURE) ×1 IMPLANT
SYR CONTROL 10ML LL (SYRINGE) ×2 IMPLANT
TOWEL GREEN STERILE (TOWEL DISPOSABLE) ×2 IMPLANT
TOWEL GREEN STERILE FF (TOWEL DISPOSABLE) ×2 IMPLANT

## 2021-05-05 NOTE — Discharge Instructions (Addendum)
[facility] Office Phone Number [phone]  BREAST BIOPSY/ PARTIAL MASTECTOMY: POST OP INSTRUCTIONS  Always review your discharge instruction sheet given to you by the facility where your surgery was performed.  IF YOU HAVE DISABILITY OR FAMILY LEAVE FORMS, YOU MUST BRING THEM TO THE OFFICE FOR PROCESSING.  DO NOT GIVE THEM TO YOUR DOCTOR.  A prescription for pain medication may be given to you upon discharge.  Take your pain medication as prescribed, if needed.  If narcotic pain medicine is not needed, then you may take acetaminophen (Tylenol) or ibuprofen (Advil) as needed. Take your usually prescribed medications unless otherwise directed If you need a refill on your pain medication, please contact your pharmacy.  They will contact our office to request authorization.  Prescriptions will not be filled after 5pm or on week-ends. You should eat very light the first 24 hours after surgery, such as soup, crackers, pudding, etc.  Resume your normal diet the day after surgery. Most patients will experience some swelling and bruising in the breast.  Ice packs and a good support bra will help.  Swelling and bruising can take several days to resolve.  It is common to experience some constipation if taking pain medication after surgery.  Increasing fluid intake and taking a stool softener will usually help or prevent this problem from occurring.  A mild laxative (Milk of Magnesia or Miralax) should be taken according to package directions if there are no bowel movements after 48 hours. Unless discharge instructions indicate otherwise, you may remove your bandages 48 hours after surgery, and you may shower at that time.  You may have steri-strips (small skin tapes) in place directly over the incision.  These strips should be left on the skin for 7-10 days.   Any sutures or staples will be removed at the office during your follow-up visit. ACTIVITIES:  You may resume regular daily activities  (gradually increasing) beginning the next day.  Wearing a good support bra or sports bra (or the breast binder) minimizes pain and swelling.  You may have sexual intercourse when it is comfortable. You may drive when you no longer are taking prescription pain medication, you can comfortably wear a seatbelt, and you can safely maneuver your car and apply brakes. RETURN TO WORK:  __________1 week_______________ You should see your doctor in the office for a follow-up appointment approximately two weeks after your surgery.  Your doctor's nurse will typically make your follow-up appointment when she calls you with your pathology report.  Expect your pathology report 2-3 business days after your surgery.  You may call to check if you do not hear from us after three days.   WHEN TO CALL YOUR DOCTOR: Fever over 101.0 Nausea and/or vomiting. Extreme swelling or bruising. Continued bleeding from incision. Increased pain, redness, or drainage from the incision.  The clinic staff is available to answer your questions during regular business hours.  Please don't hesitate to call and ask to speak to one of the nurses for clinical concerns.  If you have a medical emergency, go to the nearest emergency room or call 911.  A surgeon from [facility] is always on call at the hospital.  For further questions, please visit [email or web address]   

## 2021-05-05 NOTE — Anesthesia Procedure Notes (Signed)
Procedure Name: LMA Insertion Date/Time: 05/05/2021 9:21 AM Performed by: Rande Brunt, CRNA Pre-anesthesia Checklist: Patient identified, Emergency Drugs available, Suction available and Patient being monitored Patient Re-evaluated:Patient Re-evaluated prior to induction Oxygen Delivery Method: Circle System Utilized Preoxygenation: Pre-oxygenation with 100% oxygen Induction Type: IV induction Ventilation: Mask ventilation without difficulty LMA: LMA inserted LMA Size: 4.0 Number of attempts: 1 Airway Equipment and Method: Bite block Placement Confirmation: positive ETCO2 Tube secured with: Tape Dental Injury: Teeth and Oropharynx as per pre-operative assessment

## 2021-05-05 NOTE — Transfer of Care (Signed)
Immediate Anesthesia Transfer of Care Note  Patient: [name]  Procedure(s) Performed: RIGHT BREAST LUMPECTOMY WITH RADIOACTIVE SEED LOCALIZATION (Right: Breast)  Patient Location: PACU  Anesthesia Type:General  Level of Consciousness: awake, drowsy and patient cooperative  Airway & Oxygen Therapy: Patient Spontanous Breathing  Post-op Assessment: Report given to RN, Post -op Vital signs reviewed and stable and Patient moving all extremities  Post vital signs: Reviewed and stable  Last Vitals:  Vitals Value Taken Time  BP    Temp    Pulse 80 05/05/21 1042  Resp 12 05/05/21 1042  SpO2 96 % 05/05/21 1042  Vitals shown include unvalidated device data.  Last Pain:  Vitals:   05/05/21 0728  TempSrc:   PainSc: 0-No pain         Complications: No notable events documented.

## 2021-05-05 NOTE — Op Note (Signed)
Right Breast Radioactive seed localized lumpectomy  Indications: This patient presents with history of right breast cancer, upper inner quadrant, cT1bN0 grade 1 invasive ductal carcinoma, receptors +/+/-  Pre-operative Diagnosis: right breast cancer  Post-operative Diagnosis: Same  Surgeon: Stark Klein   Anesthesia: General endotracheal anesthesia  ASA Class: 3  Procedure Details  The patient was seen in the Holding Room. The risks, benefits, complications, treatment options, and expected outcomes were discussed with the patient. The possibilities of bleeding, infection, the need for additional procedures, failure to diagnose a condition, and creating a complication requiring other procedures or operations were discussed with the patient. The patient concurred with the proposed plan, giving informed consent.  The site of surgery properly noted/marked. The patient was taken to Operating Room # 2, identified, and the procedure verified as right breast seed localized lumpectomy.  The right breast and chest were prepped and draped in standard fashion. A superomedial circumareolar incision was made near the previously placed radioactive seed.  Dissection was carried down around the point of maximum signal intensity. The cautery was used to perform the dissection.   The specimen was inked with the margin marker paint kit.    Specimen radiography confirmed inclusion of the mammographic lesion, the clip, and the seed.  It appeared to be very close to the inferior margin on the specimen mammogram so additional inferior margin was taken.  It also seemed to be close medially during dissection, so additional medial tissue was taken.  The background signal in the breast was zero.  Hemostasis was achieved with cautery.  The cavity was marked with clips on each border other than the anterior border.  The wound was irrigated and closed with 3-0 vicryl interrupted deep dermal sutures and 4-0 monocryl running  subcuticular suture.      Sterile dressings were applied. At the end of the operation, all sponge, instrument, and needle counts were correct.   Findings: Seed, clip in specimen.  Posterior margin is pectoralis, anterior margin skin, medial margin is lateral sternal border.   Estimated Blood Loss:  min         Specimens: right breast tissue with seed, additional inferior margin, additional medial margin.          Complications:  None; patient tolerated the procedure well.         Disposition: PACU - hemodynamically stable.         Condition: stable

## 2021-05-05 NOTE — Interval H&P Note (Signed)
History and Physical Interval Note:  05/05/2021 9:02 AM  Brooke [name]  has presented today for surgery, with the diagnosis of RIGHT BREAST CANCER.  The various methods of treatment have been discussed with the patient and family. After consideration of risks, benefits and other options for treatment, the patient has consented to  Procedure(s): RIGHT BREAST LUMPECTOMY WITH RADIOACTIVE SEED LOCALIZATION (Right) as a surgical intervention.  The patient's history has been reviewed, patient examined, no change in status, stable for surgery.  I have reviewed the patient's chart and labs.  Questions were answered to the patient's satisfaction.     Stark Klein

## 2021-05-06 ENCOUNTER — Other Ambulatory Visit: Payer: Self-pay

## 2021-05-06 ENCOUNTER — Encounter (HOSPITAL_COMMUNITY): Payer: Self-pay | Admitting: General Surgery

## 2021-05-06 DIAGNOSIS — R911 Solitary pulmonary nodule: Secondary | ICD-10-CM

## 2021-05-06 NOTE — Anesthesia Postprocedure Evaluation (Signed)
Anesthesia Post Note  Patient: Brooke [name]  Procedure(s) Performed: RIGHT BREAST LUMPECTOMY WITH RADIOACTIVE SEED LOCALIZATION (Right: Breast)     Patient location during evaluation: PACU Anesthesia Type: General Level of consciousness: awake and alert Pain management: pain level controlled Vital Signs Assessment: post-procedure vital signs reviewed and stable Respiratory status: spontaneous breathing, nonlabored ventilation, respiratory function stable and patient connected to nasal cannula oxygen Cardiovascular status: blood pressure returned to baseline and stable Postop Assessment: no apparent nausea or vomiting Anesthetic complications: no   No notable events documented.  Last Vitals:  Vitals:   05/05/21 1050 05/05/21 1105  BP: (!) 157/71 (!) 152/83  Pulse: 76 73  Resp: 10 18  Temp:  36.7 C  SpO2: 97% 96%    Last Pain:  Vitals:   05/05/21 1105  TempSrc:   PainSc: 0-No pain   Pain Goal:                   Auriel Kist L Roselani Grajeda

## 2021-05-10 NOTE — Telephone Encounter (Signed)
Pt still had CT done on scheduled date on 11/16. Nothing further needed at this time.

## 2021-05-16 LAB — SURGICAL PATHOLOGY

## 2021-05-18 ENCOUNTER — Encounter: Payer: Self-pay | Admitting: *Deleted

## 2021-05-18 ENCOUNTER — Telehealth: Payer: Self-pay | Admitting: *Deleted

## 2021-05-18 NOTE — Telephone Encounter (Signed)
Ordered oncotype per Dr. Feng. Faxed requisition to [facility]. °

## 2021-05-23 ENCOUNTER — Ambulatory Visit (HOSPITAL_COMMUNITY)
Admission: RE | Admit: 2021-05-23 | Discharge: 2021-05-23 | Disposition: A | Discharge status: Home / Self Care | Payer: Federal, State, Local not specified - PPO | Source: Ambulatory Visit | Attending: Internal Medicine | Admitting: Internal Medicine

## 2021-05-23 DIAGNOSIS — R911 Solitary pulmonary nodule: Secondary | ICD-10-CM

## 2021-05-23 LAB — GLUCOSE, CAPILLARY: Glucose-Capillary: 131 mg/dL — ABNORMAL HIGH (ref 70–99)

## 2021-05-23 MED ORDER — FLUDEOXYGLUCOSE F - 18 (FDG) INJECTION
7.5000 | Freq: Once | INTRAVENOUS | Status: AC
  Administered 2021-05-23: 7.36 via INTRAVENOUS

## 2021-05-24 ENCOUNTER — Telehealth: Payer: Self-pay | Admitting: Internal Medicine

## 2021-05-24 DIAGNOSIS — R911 Solitary pulmonary nodule: Secondary | ICD-10-CM

## 2021-05-24 DIAGNOSIS — C50211 Malignant neoplasm of upper-inner quadrant of right female breast: Secondary | ICD-10-CM

## 2021-05-24 DIAGNOSIS — R9389 Abnormal findings on diagnostic imaging of other specified body structures: Secondary | ICD-10-CM

## 2021-05-24 NOTE — Telephone Encounter (Signed)
Case discussed with Dr. Annamaria Boots. Called patient to discuss scheduling bronchoscopy. Discussed risks and benefits. She is agreeable to proceed.

## 2021-05-24 NOTE — Telephone Encounter (Signed)
Documentation-I called Ms Ezelle to discuss PET. She just let her Breast Cancer surgeon's office and has Oncology visit pending with Dr Burr Medico. I will discuss possible bronchoscopy for evaluation of bibasilar inflammation with my colleagues and meanwhile will schedule future CT chest in 3 months for comparison. She agrees.  Order- schedule future CT chest in 3 months no contrast   dx abnormal PET scan, breast cancer

## 2021-05-25 ENCOUNTER — Telehealth: Payer: Self-pay | Admitting: General Practice

## 2021-05-25 NOTE — Telephone Encounter (Signed)
Please schedule the following:   Provider performing procedure:bronchoscopy  Diagnosis: abnormal CT Chest  Which side if for nodule / mass? bilateral  Procedure: Bronchoscopy with BAL and TBBX with C-arm  Has patient been spoken to by Provider and given informed consent? yes  Anesthesia: yes MAC/general  Do you need Fluro? yes  Duration of procedure: 1 hour  Date: 12/9 Friday am or PM  Alternate Date: Dec 21-23 AM or PM is fine  Location: Prefer [facility]. Can do [facility] if no options  Does patient have OSA? no DM? yes Or Latex allergy? No  Medication Restriction: none -  Anticoagulate/Antiplatelet: none  Pre-op Labs Ordered:determined by Anesthesia  Imaging request: none  (If, SuperDimension CT Chest, please have STAT courier sent to ENDO)   Please coordinate Pre-op COVID Testing  I called and scheduled her Bronch for 06/10/21 at [facility] @ 8:30 am. Pt aware of appt date/time and that someone will call her the day prior to give further instructions. She is aware NPO p midnight the night before and someone will need to drive her. She is aware of required covid testing and will go to [place] [facility] on 06/08/21. Will forward to Dr Shearon Stalls to make her aware.

## 2021-05-25 NOTE — Telephone Encounter (Addendum)
[facility] CSW Progress Notes  Call to patient at request of Dr Burr Medico, reports depression and may want counseling.  Called, no answer, left VM w my contact information and request to return my call at her convenience.  Patient returned my call.  She is encouraged by her cancer situation.  However, she also has sarcoidosis and must have a bronchoscopy.  This is anxiety producing for her - "it is more about my lungs versus my cancer, I have been mixing it all up."  "I am probably afraid of what they might find in my lung."  Can use faith and prayer resources.   Suggested scheduling "worry time" as she does have a significant amount of stress/pressure.  Encouraged her to confine worry to a certain time of day and use healthy distraction at other times.  She will call if she has any further needs/concerns.     Edwyna Shell, LCSW Clinical Social Worker Phone:  [phone]

## 2021-05-30 ENCOUNTER — Encounter: Payer: Self-pay | Admitting: *Deleted

## 2021-05-30 ENCOUNTER — Telehealth: Payer: Self-pay | Admitting: *Deleted

## 2021-05-30 DIAGNOSIS — C50211 Malignant neoplasm of upper-inner quadrant of right female breast: Secondary | ICD-10-CM

## 2021-05-30 DIAGNOSIS — Z170 Estrogen receptor positive status [ER+]: Secondary | ICD-10-CM

## 2021-05-30 NOTE — Telephone Encounter (Signed)
Received oncotype results of 23/9%. Patient is aware. Referral placed for Dr. Lisbeth Renshaw

## 2021-06-01 ENCOUNTER — Telehealth: Payer: Self-pay | Admitting: Internal Medicine

## 2021-06-01 ENCOUNTER — Ambulatory Visit
Admission: RE | Admit: 2021-06-01 | Discharge: 2021-06-01 | Disposition: A | Discharge status: Home / Self Care | Payer: Self-pay | Source: Ambulatory Visit | Attending: Radiation Oncology | Admitting: Radiation Oncology

## 2021-06-01 ENCOUNTER — Inpatient Hospital Stay
Admission: RE | Admit: 2021-06-01 | Discharge: 2021-06-01 | Disposition: A | Discharge status: Home / Self Care | Payer: Self-pay | Source: Ambulatory Visit | Attending: Radiation Oncology | Admitting: Radiation Oncology

## 2021-06-01 ENCOUNTER — Other Ambulatory Visit: Payer: Self-pay | Admitting: Radiation Oncology

## 2021-06-01 DIAGNOSIS — Z170 Estrogen receptor positive status [ER+]: Secondary | ICD-10-CM

## 2021-06-01 DIAGNOSIS — C50211 Malignant neoplasm of upper-inner quadrant of right female breast: Secondary | ICD-10-CM

## 2021-06-01 NOTE — Telephone Encounter (Signed)
Called and spoke with patient. Let them know their Bronch is scheduled for 06/10/2021 with Dr. Shearon Stalls at [facility].  Patient was instructed to arrive at hospital at 6:30 am. They were instructed to bring someone with them as they will not be able to drive home from procedure. Patient instructed not to have anything to eat or drink after midnight.   Patient's covid screening is scheduled for  06/07/21  Patient voiced understanding, nothing further needed  Routing to Dr. Shearon Stalls as Juluis Rainier

## 2021-06-03 ENCOUNTER — Encounter (HOSPITAL_COMMUNITY): Payer: Self-pay

## 2021-06-03 ENCOUNTER — Encounter: Payer: Self-pay | Admitting: Hematology

## 2021-06-06 ENCOUNTER — Encounter: Payer: Self-pay | Admitting: *Deleted

## 2021-06-08 ENCOUNTER — Other Ambulatory Visit: Payer: Self-pay | Admitting: Internal Medicine

## 2021-06-08 LAB — SARS CORONAVIRUS 2 (TAT 6-24 HRS): SARS Coronavirus 2: POSITIVE — AB

## 2021-06-08 NOTE — H&P (View-Only) (Signed)
Covid infection  Order- Staff- please send molnupiravir x 5 days  Also please notify Dr Shearon Stalls- she will want to reschedule bronchoscopy after Covid infection resolves.

## 2021-06-08 NOTE — Progress Notes (Signed)
Covid infection  Order- Staff- please send molnupiravir x 5 days  Also please notify Dr Shearon Stalls- she will want to reschedule bronchoscopy after Covid infection resolves.

## 2021-06-09 ENCOUNTER — Telehealth: Payer: Self-pay | Admitting: Internal Medicine

## 2021-06-09 MED ORDER — MOLNUPIRAVIR EUA 200MG CAPSULE: 4.0000 | ORAL_CAPSULE | Freq: Two times a day (BID) | ORAL | 0 refills | Status: AC

## 2021-06-09 NOTE — Progress Notes (Signed)
CY please advise if this is the correct dosing that you wanted.  thanks

## 2021-06-09 NOTE — Telephone Encounter (Signed)
Yes it sounds like she is an incidental covid positive. No need for treatment if she doesn't have symptoms but she should mask because we do presume she could be contagious. We do need to postpone her bronchoscopy. My next availability and based on her window would be Jan 17-19.

## 2021-06-09 NOTE — Addendum Note (Signed)
Addended by: Baird Lyons D on: 06/09/2021 12:14 PM   Modules accepted: Orders

## 2021-06-09 NOTE — Telephone Encounter (Signed)
Called and spoke with patient. She verbalized understanding. She would like to have it done on January 17th. She is aware that she will receive another call in regards to the exact appt time and when she needs to be at the hospital. She is also aware that she will need to make arrangements to have someone to drive her home after the procedure.   Will route to the procedure pool for follow up.

## 2021-06-09 NOTE — Telephone Encounter (Signed)
Called and spoke with patient. She is currently scheduled for a bronch tomorrow with Dr. Shearon Stalls. She tested positive. She was shocked by the results because she does not have any symptoms and hasn't any symptoms for the past 2 weeks. She took another test at home and the test came back negative. She is wondering if the first test is accurate.   She is also wondering since the test is positive, will she need to reschedule her bronch tomorrow.   Dr. Shearon Stalls, can you please advise? Thanks!

## 2021-06-10 ENCOUNTER — Ambulatory Visit (HOSPITAL_COMMUNITY)
Admission: RE | Admit: 2021-06-10 | Payer: Federal, State, Local not specified - PPO | Source: Home / Self Care | Admitting: Internal Medicine

## 2021-06-10 SURGERY — VIDEO BRONCHOSCOPY WITHOUT FLUORO
Anesthesia: General

## 2021-06-14 NOTE — Telephone Encounter (Addendum)
Called and spoke with patient to update her and let her know that I am working on getting this scheduled and will call her back after 06/21/20 with more information. She expressed understanding.  Dr. Shearon Stalls when I called to schedule this they said it has to be done at [facility] and I see that you are at [facility] Jan 17-19. Are you ok with it being at [facility]?        Please schedule the following:   Provider performing procedure:bronchoscopy  Diagnosis: abnormal CT Chest  Which side if for nodule / mass? bilateral  Procedure: Bronchoscopy with BAL and TBBX with C-arm  Has patient been spoken to by Provider and given informed consent? yes  Anesthesia: yes MAC/general  Do you need Fluro? yes  Duration of procedure: 1 hour  Date: 12/9 Friday am or PM  Alternate Date: Dec 21-23 AM or PM is fine  Location: [facility]. Can do [facility] if no options  Does patient have OSA? no DM? yes Or Latex allergy? No  Medication Restriction: none -  Anticoagulate/Antiplatelet: none  Pre-op Labs Ordered:determined by Anesthesia  Imaging request: none  (If, SuperDimension CT Chest, please have STAT courier sent to ENDO)   Please coordinate Pre-op COVID Testing

## 2021-06-15 ENCOUNTER — Other Ambulatory Visit: Payer: Self-pay | Admitting: Internal Medicine

## 2021-06-21 NOTE — Telephone Encounter (Signed)
Yes, fine to do at [facility].

## 2021-06-24 ENCOUNTER — Telehealth: Payer: Self-pay | Admitting: Internal Medicine

## 2021-06-24 NOTE — Telephone Encounter (Signed)
I called central scheduling and endo at [facility] and right now they don't have anything available for Jan 17-19. She said they are currently 10 nurse's down and they have had to close rooms. She said there are 2 slots that are being held and would possibly be available but she wouldn't know until next Friday Jan 13th.   Are there any alternative dates that would work?

## 2021-06-24 NOTE — Telephone Encounter (Signed)
Called and spoke with patient. Let them know their Bronch is scheduled for 07/04/2021 with Dr. Shearon Stalls at [facility].  Patient was instructed to arrive at hospital at 9:30 am. They were instructed to bring someone with them as they will not be able to drive home from procedure. Patient instructed not to have anything to eat or drink after midnight.    Patient's covid screening will be 07/01/2021 at covid testing site.  Patient voiced understanding, nothing further needed  Routing to Dr. Shearon Stalls as Juluis Rainier

## 2021-06-24 NOTE — Telephone Encounter (Signed)
Talked with [facility] endo stafff. Scheduled for 11:45 on Jan 16th. Mandi could you please call her and let her know? Thank you

## 2021-06-27 ENCOUNTER — Telehealth: Payer: Self-pay

## 2021-06-27 NOTE — Telephone Encounter (Addendum)
Spoke w/ patient and verified her identity. Reminded patient of her 9:00am -06/28/21 "in-person" appointment w/ Shona Simpson PA-C. Advised patient to arrive 23min early for check-in. I left my extension [phone] for patient to call if needed. Patient verbalized understanding of the information given.

## 2021-06-27 NOTE — Telephone Encounter (Signed)
Called and spoke with patient. Let them know their Bronch is scheduled for 07/04/2021 with Dr. Shearon Stalls at [facility].  Patient was instructed to arrive at hospital at 9:30 am. They were instructed to bring someone with them as they will not be able to drive home from procedure. Patient instructed not to have anything to eat or drink after midnight.     Patient's covid screening will be 07/01/2021 at covid testing site.   Patient voiced understanding, nothing further needed   Routing to Dr. Shearon Stalls as Juluis Rainier

## 2021-06-27 NOTE — Progress Notes (Signed)
Radiation Oncology         [phone] ________________________________  Name: Brooke [name]        MRN: [id number]  Date of Service: 06/28/2021 DOB: October 06, 1945  YB:FXOVANVB, Real Cons, MD  Brooke Merle, MD     REFERRING PHYSICIAN: Truitt Merle, MD   DIAGNOSIS: The encounter diagnosis was Malignant neoplasm of upper-inner quadrant of right breast in female, estrogen receptor positive ([place]).   HISTORY OF PRESENT ILLNESS: Brooke [name] is a 76 y.o. female originally seen in the multidisciplinary breast clinic for a new diagnosis of right breast cancer. The patient was found to have architectural distortion in the right breast and diagnostic imaging showed a mass in the 2:00 position measuring up to 9 mm. Her right axilla was negative for adenopathy. 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   This visit, the patient has undergone right lumpectomy on 05/05/2021 which showed a grade 1 invasive ductal carcinoma measuring 1.4 cm.  Her inferior margin was less than 1 mm to carcinoma.  An additional excision showed no residual carcinoma in the inferior and medial specimen.  Of note she developed COVID in December 2022. She has been followed by Dr. Shearon [name] in pulmonary medicine for many years given her history of sarcoidosis.  She had a PET scan after CT showed a nodule in the right lung, her PET on 05/23/2021 showed a 12 mm nodule in the right hemidiaphragm with intense metabolic activity up to 5.9 and a focus of consolidation/nodularity in the inferior lingula measuring 1.7 cm with an SUV of 6.9.  4 similar hypermetabolic nodules in the right lung base as well as a 13 mm left upper lobe nodule with SUV of 4.1 and 2 left lung base nodules that were hypermetabolic were also identified.  She is planning to undergo bronchoscopy on 07/04/2021.  Of note her  Oncotype DX score was 23 and no systemic chemotherapy is planned.  She is here to discuss adjuvant radiotherapy to the right breast.     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;  Service: General;  Laterality: Right;   CHOLECYSTECTOMY  06/19/1973   COLONOSCOPY     ESOPHAGOGASTRODUODENOSCOPY     ESOPHAGOGASTRODUODENOSCOPY N/A 08/09/2014   Procedure: ESOPHAGOGASTRODUODENOSCOPY (EGD);  Surgeon: Brooke Mayer, MD;  Location: [facility] ENDOSCOPY;  Service: Endoscopy;  Laterality: N/A;   FOOT SURGERY  06/20/2011   left     FAMILY HISTORY:  Family History  Problem Relation Age of Onset   Hypertension Mother    Colon cancer  Neg Hx      SOCIAL HISTORY:  reports that she quit smoking about 39 years ago. Her smoking use included cigarettes. She has a 0.45 pack-year smoking history. She has never used smokeless tobacco. She reports that she does not drink alcohol and does not use drugs. She is single and lives in [place]. 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 She still enjoys volunteering with local campaigns and assists if candidates are elected once in their seat.       ALLERGIES: Codeine   MEDICATIONS:  Current Outpatient Medications  Medication Sig Dispense Refill   acetaminophen (TYLENOL) 500 MG tablet Take 500 mg by mouth every 6 (six) hours as needed for moderate pain.     albuterol (VENTOLIN HFA) 108 (90 Base) MCG/ACT inhaler INHALE 2 PUFFS BY MOUTH EVERY 6 HOURS AS NEEDED 25.5 g 11   ALPRAZolam (XANAX) 0.5 MG tablet TAKE 1/2 TABLET(0.25 MG) BY MOUTH DAILY AS NEEDED FOR ANXIETY 30 tablet 0   atorvastatin (LIPITOR) 40 MG tablet TAKE 1 TABLET(40 MG) BY MOUTH DAILY 90 tablet 1   BD PEN NEEDLE NANO 2ND GEN 32G X 4 MM MISC USE TO INJECT INSULIN AS DIRECTED 100 each 2   blood glucose meter kit and supplies Dispense based on patient and insurance preference. Use up to four times daily as directed. (FOR ICD-10 E10.9, E11.9). 1 each 0   carboxymethylcellulose (REFRESH PLUS) 0.5 % SOLN Place 1 drop into both eyes daily as needed (dry eyes).     dapagliflozin propanediol (FARXIGA) 10 MG TABS tablet Take 1 tablet (10 mg total) by mouth daily. 90 tablet 1   escitalopram (LEXAPRO) 10 MG tablet TAKE 1 TABLET(10 MG) BY MOUTH DAILY 90 tablet 3   glimepiride (AMARYL) 2 MG tablet TAKE 1 TABLET BY MOUTH DAILY WITH BREAKFAST. PLEASE SCHEDULE OVERDUE APPOINTMENT FOR FURTHER REFILLS 60 tablet 0   LANTUS SOLOSTAR 100 UNIT/ML Solostar Pen ADMINISTER 10 TO 25 UNITS UNDER THE SKIN DAILY (Patient taking differently: Inject 20 Units into the skin at bedtime.) 15 mL 2   latanoprost (XALATAN) 0.005 % ophthalmic solution 1 drop in both eyes at bedtime (Patient not taking: Reported on 06/24/2021)  0   losartan (COZAAR) 100 MG tablet TAKE 1 TABLET BY MOUTH EVERY DAY 90 tablet 1   Multiple Vitamin (MULTIVITAMIN WITH MINERALS) TABS tablet Take 1 tablet by mouth daily.     ondansetron (ZOFRAN ODT) 4 MG disintegrating tablet Take 1 tablet (4 mg total) by mouth every 8 (eight) hours as needed for nausea or vomiting. (Patient not taking: Reported on 06/02/2021) 20 tablet 0   ONETOUCH  ULTRA test strip TEST FOUR TIMES DAILY AS DIRECTED 100 strip 2   No current facility-administered medications for this visit.     REVIEW OF SYSTEMS: On review of systems, the patient reports that she is doing well overall. She's noticed some fullness in her right breast deep to her incision site but no fevers, redness or progressive pain. She is wearing a soft shell style bra. She denies any significant changes in her breathing, or new cough or hemoptysis. Her weight has been stable. No other complaints are noted.     PHYSICAL EXAM:  Wt Readings from Last 3 Encounters:  05/05/21 150 lb (68 kg)  04/28/21 149 lb 6.4 oz (67.8 kg)  04/26/21 151 lb (68.5 kg)   Temp Readings from Last 3 Encounters:  05/05/21 98.1 F (36.7 C)  04/28/21 98.4 F (36.9 C) (  Oral)  04/26/21 98.3 F (36.8 C) (Oral)   BP Readings from Last 3 Encounters:  05/05/21 (!) 152/83  04/28/21 140/90  04/26/21 130/72   Pulse Readings from Last 3 Encounters:  05/05/21 73  04/28/21 81  04/26/21 70    In general this is a well appearing African American female in no acute distress. She's alert and oriented x4 and appropriate throughout the examination. Cardiopulmonary assessment is negative for acute distress and she exhibits normal effort.  Her incision site in the right breast is well-healed without erythema separation or drainage but she has a large fullness deep to the incision site that is somewhat firm but also fluctuant.    ECOG = 1  0 - Asymptomatic (Fully active, able to carry on all predisease activities without restriction)  1 - Symptomatic but completely ambulatory (Restricted in physically strenuous activity but ambulatory and able to carry out work of a light or sedentary nature. For example, light housework, office work)  2 - Symptomatic, <50% in bed during the day (Ambulatory and capable of all self care but unable to carry out any work activities. Up and about more than 50% of waking hours)  3 -  Symptomatic, >50% in bed, but not bedbound (Capable of only limited self-care, confined to bed or chair 50% or more of waking hours)  4 - Bedbound (Completely disabled. Cannot carry on any self-care. Totally confined to bed or chair)  5 - Death   Eustace Pen MM, Creech RH, Tormey DC, et al. [phone]). "Toxicity and response criteria of the [facility] Group". [place] Oncol. 5 (6): 649-55    LABORATORY DATA:  Lab Results  Component Value Date   WBC 5.3 04/20/2021   HGB 13.4 04/20/2021   HCT 40.8 04/20/2021   MCV 89.7 04/20/2021   PLT 286 04/20/2021   Lab Results  Component Value Date   NA 139 04/20/2021   K 4.7 04/20/2021   CL 104 04/20/2021   CO2 26 04/20/2021   Lab Results  Component Value Date   ALT 14 04/20/2021   AST 15 04/20/2021   ALKPHOS 103 04/20/2021   BILITOT 0.4 04/20/2021      RADIOGRAPHY: No results found.     IMPRESSION/PLAN: 1. Stage IA, cT1c, cN0M0 grade 1, ER/PR positive invasive ductal carcinoma of the right breast. Dr. Lisbeth Renshaw discusses the pathology findings and reviews the nature of right breast disease.  Dr. Lisbeth Renshaw discusses that while in some cases adjuvant radiotherapy may be optional depending on T stage, hormonal impact, grade, and the patient's age however he would recommend external radiotherapy to the breast  to reduce risks of local recurrence given her close margin, followed by antiestrogen therapy. We discussed the risks, benefits, short, and long term effects of radiotherapy, as well as the curative intent, and the patient is interested in proceeding. Dr. Lisbeth Renshaw discusses the delivery and logistics of radiotherapy and anticipates a course of 4 weeks of radiotherapy to the right breast. Written consent is obtained and placed in the chart, a copy was provided to the patient. We will postpone her simulation for another two weeks given her seroma and also given the findings in her lung nodules until these can be further  clarified. 2. Hypermetabolic lung nodules.  We will follow-up with the results of her bronchoscopy.  She is aware of the possibility of further discussion of either surgery or radiotherapy if she had an early stage lung cancer. 3. Postoperative Seroma. I suggested the patient wear  a compressive sports style bra and have placed an urgent referral to PT to see if she is a candidate for other compression garments, wedge insert, or exercises to mobilize this fluid prior to proceeding with radiotherapy.  In a visit lasting 45 minutes, greater than 50% of the time was spent face to face reviewing her case, as well as in preparation of, discussing, and coordinating the patient's care.  The above documentation reflects my direct findings during this shared patient visit. Please see the separate note by Dr. Lisbeth Renshaw on this date for the remainder of the patient's plan of care.    Brooke [name], [facility]    **Disclaimer: This note was dictated with voice recognition software. Similar sounding words can inadvertently be transcribed and this note may contain transcription errors which may not have been corrected upon publication of note.**

## 2021-06-28 ENCOUNTER — Other Ambulatory Visit: Payer: Self-pay

## 2021-06-28 ENCOUNTER — Ambulatory Visit
Admission: RE | Admit: 2021-06-28 | Discharge: 2021-06-28 | Disposition: A | Discharge status: Home / Self Care | Payer: Federal, State, Local not specified - PPO | Source: Ambulatory Visit | Attending: Radiation Oncology | Admitting: Radiation Oncology

## 2021-06-28 ENCOUNTER — Encounter: Payer: Self-pay | Admitting: Radiation Oncology

## 2021-06-28 ENCOUNTER — Ambulatory Visit: Payer: Federal, State, Local not specified - PPO | Admitting: Radiation Oncology

## 2021-06-28 VITALS — BP 180/92 | HR 77 | Temp 97.1°F | Resp 18 | Ht 63.0 in | Wt 147.5 lb

## 2021-06-28 DIAGNOSIS — Z79899 Other long term (current) drug therapy: Secondary | ICD-10-CM | POA: Diagnosis not present

## 2021-06-28 DIAGNOSIS — C50211 Malignant neoplasm of upper-inner quadrant of right female breast: Secondary | ICD-10-CM

## 2021-06-28 DIAGNOSIS — R918 Other nonspecific abnormal finding of lung field: Secondary | ICD-10-CM | POA: Diagnosis not present

## 2021-06-28 DIAGNOSIS — Z86718 Personal history of other venous thrombosis and embolism: Secondary | ICD-10-CM | POA: Diagnosis not present

## 2021-06-28 DIAGNOSIS — Z170 Estrogen receptor positive status [ER+]: Secondary | ICD-10-CM

## 2021-06-28 DIAGNOSIS — Z794 Long term (current) use of insulin: Secondary | ICD-10-CM | POA: Insufficient documentation

## 2021-06-28 DIAGNOSIS — G629 Polyneuropathy, unspecified: Secondary | ICD-10-CM | POA: Insufficient documentation

## 2021-06-28 DIAGNOSIS — D869 Sarcoidosis, unspecified: Secondary | ICD-10-CM | POA: Insufficient documentation

## 2021-06-28 DIAGNOSIS — J90 Pleural effusion, not elsewhere classified: Secondary | ICD-10-CM | POA: Diagnosis not present

## 2021-06-28 DIAGNOSIS — Z87891 Personal history of nicotine dependence: Secondary | ICD-10-CM | POA: Insufficient documentation

## 2021-06-28 DIAGNOSIS — Z7984 Long term (current) use of oral hypoglycemic drugs: Secondary | ICD-10-CM | POA: Insufficient documentation

## 2021-06-28 DIAGNOSIS — M96843 Postprocedural seroma of a musculoskeletal structure following other procedure: Secondary | ICD-10-CM | POA: Diagnosis not present

## 2021-06-28 DIAGNOSIS — E119 Type 2 diabetes mellitus without complications: Secondary | ICD-10-CM | POA: Insufficient documentation

## 2021-06-28 NOTE — Progress Notes (Signed)
Identified patient identity and begin nursing interview. Patient reports RT breast swelling w/o pain. No other symptoms reported at this time.  Meaningful use complete. Postmenopausal, NO chances of pregnancy.  BP (!) 180/92 (BP Location: Left Arm, Patient Position: Sitting, Cuff Size: Normal)    Pulse 77    Temp (!) 97.1 F (36.2 C) (Temporal)    Resp 18    Ht 5\' 3"  (1.6 m)    Wt 147 lb 8 oz (66.9 kg)    SpO2 98%    BMI 26.13 kg/m

## 2021-07-01 ENCOUNTER — Other Ambulatory Visit: Payer: Self-pay

## 2021-07-01 ENCOUNTER — Ambulatory Visit: Payer: Federal, State, Local not specified - PPO | Attending: Radiation Oncology

## 2021-07-01 ENCOUNTER — Other Ambulatory Visit: Payer: Self-pay | Admitting: Internal Medicine

## 2021-07-01 DIAGNOSIS — R600 Localized edema: Secondary | ICD-10-CM | POA: Insufficient documentation

## 2021-07-01 DIAGNOSIS — C50211 Malignant neoplasm of upper-inner quadrant of right female breast: Secondary | ICD-10-CM | POA: Diagnosis present

## 2021-07-01 DIAGNOSIS — Z170 Estrogen receptor positive status [ER+]: Secondary | ICD-10-CM | POA: Insufficient documentation

## 2021-07-01 DIAGNOSIS — M25611 Stiffness of right shoulder, not elsewhere classified: Secondary | ICD-10-CM | POA: Diagnosis present

## 2021-07-01 LAB — SARS CORONAVIRUS 2 (TAT 6-24 HRS): SARS Coronavirus 2: NEGATIVE

## 2021-07-01 NOTE — Patient Instructions (Signed)
Patient was instructed today in a home exercise program today for post op shoulder range of motion. These included active assist shoulder flexion in sitting/supine scapular retraction, wall walking with shoulder abduction, and hands behind head external rotation in supine.  She was encouraged to do these twice a day, holding 3 seconds and repeating 5 times to get remainder of her ROM back. She was advised to perform to comfortable stretch only.

## 2021-07-01 NOTE — Therapy (Signed)
[facility] @ [place] [place], [place], [address] Phone: [phone]   Fax:  [phone]  Physical Therapy Evaluation  Patient Details  Name: Brooke [name] MRN: [id number] Date of Birth: 10-07-1945 Referring Provider (PT): Worthy Flank   Encounter Date: 07/01/2021   PT End of Session - 07/01/21 1235     Visit Number 1    Number of Visits 8    Date for PT Re-Evaluation 07/29/21    PT Start Time 0906    PT Stop Time 1004    PT Time Calculation (min) 58 min    Activity Tolerance Patient tolerated treatment well    Behavior During Therapy [facility] for tasks assessed/performed             Past Medical History:  Diagnosis Date   Allergic rhinitis    Anxiety    Diabetes mellitus without complication ([place])    DVT (deep venous thrombosis) ([place])    Erosive gastritis with hemorrhage 08/09/2014   Hypertension    Malignant neoplasm of upper-inner quadrant of right breast in female, estrogen receptor positive ([place]) 04/15/2021   Peripheral neuropathy    uncertain relation to lung disease   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    Pleural effusion    Sarcoidosis     Past Surgical History:  Procedure Laterality Date   ABDOMINAL HYSTERECTOMY     BREAST LUMPECTOMY WITH RADIOACTIVE SEED LOCALIZATION Right 05/05/2021   Procedure: RIGHT BREAST LUMPECTOMY WITH RADIOACTIVE SEED LOCALIZATION;  Surgeon: Stark Klein, MD;  Location: [facility];  Service: General;  Laterality: Right;   CHOLECYSTECTOMY  06/19/1973   COLONOSCOPY     ESOPHAGOGASTRODUODENOSCOPY     ESOPHAGOGASTRODUODENOSCOPY N/A 08/09/2014   Procedure: ESOPHAGOGASTRODUODENOSCOPY (EGD);  Surgeon: Gatha Mayer, MD;  Location: [facility] ENDOSCOPY;  Service: Endoscopy;  Laterality: N/A;   FOOT SURGERY  06/20/2011   left    There were no vitals filed for this visit.    Subjective Assessment - 07/01/21 1237     Subjective I have fluid in my right breast as a result of the  lumpectomy. She has concerns about getting rid of the fluid because she will start radiation soon.  It feels like a hard knot the size of a lemon.  She had me buy a sports bra so I wore it today, but its hard to get on.    Pertinent History She had routine screening mammography on 03/24/21 showing a possible abnormality in the right breast. She underwent right diagnostic mammography and right breast ultrasonography on 03/29/21 showing: 0.9 cm irregular mass at 2 o'clock, no significant right axilla abnormalities.     Biopsy on 04/07/21 showed: invasive ductal carcinoma, grade 1; fibroadenomatoid nodule with calcifications. Prognostic indicators significant for: estrogen receptor, 100% positive and progesterone receptor, 50% positive. Proliferation marker Ki67 at 2%. HER2 negative by FISH.   Right breast lumpectomy performed on 05/26/2021. other medical hx includes DM, hypertension, sarcoidosis    Patient Stated Goals Get rid of right breast swelling.    Currently in Pain? No/denies    Pain Score 0-No pain                [facility] PT Assessment - 07/01/21 0001       Assessment   Medical Diagnosis Right breast Cancer    Referring Provider (PT) Worthy Flank    Onset Date/Surgical Date 05/24/21    Hand Dominance Left    Prior Therapy no      Precautions  Precaution Comments DM,sarcoidosis      Restrictions   Weight Bearing Restrictions No      Balance Screen   Has the patient fallen in the past 6 months No    Has the patient had a decrease in activity level because of a fear of falling?  No    Is the patient reluctant to leave their home because of a fear of falling?  No      Home Environment   Living Environment Private residence    Living Arrangements Alone    Available Help at Discharge Family    Type of [place]      Prior Function   Level of [place] Retired    Doctor, general practice, walks some      Cognition   Overall Cognitive Status  Within Functional Limits for tasks assessed      Observation/Other Assessments   Observations Large moderately firm seroma right breast inner upper quadrant proximal to nipple. incision healed      Posture/Postural Control   Posture/Postural Control No significant limitations      AROM   AROM Assessment Site Shoulder    Right/Left Shoulder Right;Left    Right Shoulder Extension 60 Degrees    Right Shoulder Flexion 150 Degrees    Right Shoulder ABduction 149 Degrees    Right Shoulder Internal Rotation 70 Degrees    Right Shoulder External Rotation 93 Degrees    Left Shoulder Extension 60 Degrees    Left Shoulder Flexion 167 Degrees    Left Shoulder ABduction 174 Degrees    Left Shoulder Internal Rotation 78 Degrees    Left Shoulder External Rotation 101 Degrees      Palpation   Palpation comment no tenderness at seroma, mild tenderness medial aspect of incision at nipple               LYMPHEDEMA/ONCOLOGY QUESTIONNAIRE - 07/01/21 0001       Type   Cancer Type Right breast Cancer      Surgeries   Lumpectomy Date 05/24/21    Sentinel Lymph Node Biopsy Date --    Number Lymph Nodes Removed 0      Treatment   Active Chemotherapy Treatment No    Past Chemotherapy Treatment No    Active Radiation Treatment --   pending   Past Radiation Treatment No    Current Hormone Treatment No    Past Hormone Therapy No      What other symptoms do you have   Are you Having Heaviness or Tightness No    Are you having pitting edema No    Is it Hard or Difficult finding clothes that fit No    Do you have infections No      Right Upper Extremity Lymphedema   10 cm Proximal to Olecranon Process 28.5 cm    Olecranon Process 22.3 cm    10 cm Proximal to Ulnar Styloid Process 19 cm    Just Proximal to Ulnar Styloid Process 13.8 cm    At Base of 2nd Digit 5.3 cm      Left Upper Extremity Lymphedema   10 cm Proximal to Olecranon Process 28.1 cm    Olecranon Process 23.2 cm    10 cm  Proximal to Ulnar Styloid Process 18.8 cm    Just Proximal to Ulnar Styloid Process 13.7 cm    At Base of 2nd Digit 5.1 cm  Katina Dung - 07/01/21 0001     Open a tight or new jar No difficulty    Do heavy household chores (wash walls, wash floors) Moderate difficulty    Carry a shopping bag or briefcase No difficulty    Wash your back Mild difficulty    Use a knife to cut food No difficulty    Recreational activities in which you take some force or impact through your arm, shoulder, or hand (golf, hammering, tennis) Moderate difficulty    During the past week, to what extent has your arm, shoulder or hand problem interfered with your normal social activities with family, friends, neighbors, or groups? Not at all    During the past week, to what extent has your arm, shoulder or hand problem limited your work or other regular daily activities Not at all    Arm, shoulder, or hand pain. None    Tingling (pins and needles) in your arm, shoulder, or hand None    Difficulty Sleeping No difficulty    DASH Score 11.36 %              Objective measurements completed on examination: See above findings.                PT Education - 07/01/21 1235     Education Details 4 post op exs, compression bra and where to get    Person(s) Educated Patient    Methods Explanation;Demonstration;Handout    Comprehension Verbalized understanding                 PT Long Term Goals - 07/01/21 1255       PT LONG TERM GOAL #1   Title pt will restore right shoulder ROM to WNL    Time 4    Period Weeks    Status New    Target Date 07/29/21      PT LONG TERM GOAL #2   Title Pt will have decreased firmness from seroma by 50% or more    Time 4    Period Weeks    Status New    Target Date 07/29/21      PT LONG TERM GOAL #3   Title Pt will be independent in self MLD to the right breast    Time 4    Period Weeks    Status New    Target Date 07/29/21                     Plan - 07/01/21 1245     Clinical Impression Statement Pt is s/p Right breast lumpectomy without SLNB. She is referred for right breast seroma and MLD.  Seroma is moderately firm and about the size of a lemon, most proxmal to the nipple, but a small amount under the breast.. It is non tender.  Shoulder ROM was assessed and is mildly limited on the right.  She was instructed in 4 post op exercises and should be able to restore ROM fairly quickly. She does have a sports bra but it is hard for her to get into.  She was given a script for a compression bra and she will call [employer] to see if it is covered. She was educated how to massage the seroma to soften it, and manual lymphatic drainage was explained in simple terms to her. a half inch foam pad inside TG soft was made and placed in her bra to try and soften the area. She has  a bronchoscopy on Monday. She is likely to have radiation    Personal Factors and Comorbidities Comorbidity 3+    Comorbidities Right breast Cancer,DM, hypertension, sarcoidosis    Examination-Activity Limitations Reach Overhead    Stability/Clinical Decision Making Stable/Uncomplicated    Clinical Decision Making Low    Rehab Potential Excellent    PT Frequency 2x / week    PT Duration 4 weeks    PT Treatment/Interventions ADLs/Self Care Home Management;Therapeutic exercise;Manual techniques;Patient/family education;Manual lymph drainage;Scar mobilization;Passive range of motion    PT Next Visit Plan Did pt get compression bra?, benefit of foam pad ?post op exs going well? initiate R breast MLD and instruct pt.    PT Home Exercise Plan 4 post op exs    Recommended Other Services compression bra    Consulted and Agree with Plan of Care Patient             Patient will benefit from skilled therapeutic intervention in order to improve the following deficits and impairments:  Increased edema, Decreased knowledge of precautions,  Decreased range of motion, Decreased scar mobility  Visit Diagnosis: Malignant neoplasm of upper-inner quadrant of right breast in female, estrogen receptor positive ([place])  Localized edema  Stiffness of right shoulder, not elsewhere classified     Problem List Patient Active Problem List   Diagnosis Date Noted   Malignant neoplasm of upper-inner quadrant of right breast in female, estrogen receptor positive ([place]) 04/15/2021   Adjustment disorder with mixed anxiety and depressed mood 08/18/2020   Routine general medical examination at a health care facility 07/11/2016   Diabetes mellitus with glaucoma ([place]) 07/13/2014   Essential hypertension 07/13/2014   Hyperlipidemia associated with type 2 diabetes mellitus ([place]) 07/13/2014   SARCOIDOSIS, PULMONARY 07/18/2007   Acute thromboembolism of deep veins of lower extremity in remission 07/18/2007   Seasonal and perennial allergic rhinitis 07/18/2007    Claris Pong, PT 07/01/2021, 1:00 PM  [facility] @ [place] [place], [place], [address] Phone: [phone]   Fax:  [phone]  Name: Brooke [name] MRN: [id number] Date of Birth: 10/02/1945

## 2021-07-04 ENCOUNTER — Ambulatory Visit (HOSPITAL_COMMUNITY): Payer: Federal, State, Local not specified - PPO | Admitting: Anesthesiology

## 2021-07-04 ENCOUNTER — Ambulatory Visit (HOSPITAL_COMMUNITY)
Admission: RE | Admit: 2021-07-04 | Discharge: 2021-07-04 | Disposition: A | Discharge status: Home / Self Care | Payer: Federal, State, Local not specified - PPO | Source: Home / Self Care | Attending: Critical Care Medicine | Admitting: Critical Care Medicine

## 2021-07-04 ENCOUNTER — Encounter (HOSPITAL_COMMUNITY): Payer: Self-pay | Admitting: Critical Care Medicine

## 2021-07-04 ENCOUNTER — Ambulatory Visit (HOSPITAL_COMMUNITY): Payer: Federal, State, Local not specified - PPO

## 2021-07-04 ENCOUNTER — Encounter (HOSPITAL_COMMUNITY)
Admission: RE | Disposition: A | Discharge status: Home / Self Care | Payer: Self-pay | Source: Home / Self Care | Attending: Critical Care Medicine

## 2021-07-04 ENCOUNTER — Ambulatory Visit (HOSPITAL_COMMUNITY)
Admission: RE | Admit: 2021-07-04 | Discharge: 2021-07-04 | Disposition: A | Discharge status: Home / Self Care | Payer: Federal, State, Local not specified - PPO | Attending: Critical Care Medicine | Admitting: Critical Care Medicine

## 2021-07-04 DIAGNOSIS — Z8711 Personal history of peptic ulcer disease: Secondary | ICD-10-CM | POA: Diagnosis not present

## 2021-07-04 DIAGNOSIS — Z9889 Other specified postprocedural states: Secondary | ICD-10-CM

## 2021-07-04 DIAGNOSIS — R9389 Abnormal findings on diagnostic imaging of other specified body structures: Secondary | ICD-10-CM | POA: Diagnosis not present

## 2021-07-04 DIAGNOSIS — D869 Sarcoidosis, unspecified: Secondary | ICD-10-CM | POA: Diagnosis not present

## 2021-07-04 DIAGNOSIS — I10 Essential (primary) hypertension: Secondary | ICD-10-CM | POA: Insufficient documentation

## 2021-07-04 DIAGNOSIS — R918 Other nonspecific abnormal finding of lung field: Secondary | ICD-10-CM | POA: Insufficient documentation

## 2021-07-04 DIAGNOSIS — Z170 Estrogen receptor positive status [ER+]: Secondary | ICD-10-CM | POA: Insufficient documentation

## 2021-07-04 DIAGNOSIS — Z794 Long term (current) use of insulin: Secondary | ICD-10-CM | POA: Insufficient documentation

## 2021-07-04 DIAGNOSIS — C50919 Malignant neoplasm of unspecified site of unspecified female breast: Secondary | ICD-10-CM | POA: Diagnosis not present

## 2021-07-04 DIAGNOSIS — Z87891 Personal history of nicotine dependence: Secondary | ICD-10-CM | POA: Diagnosis not present

## 2021-07-04 DIAGNOSIS — E119 Type 2 diabetes mellitus without complications: Secondary | ICD-10-CM | POA: Diagnosis not present

## 2021-07-04 DIAGNOSIS — Z7984 Long term (current) use of oral hypoglycemic drugs: Secondary | ICD-10-CM | POA: Diagnosis not present

## 2021-07-04 DIAGNOSIS — Z86718 Personal history of other venous thrombosis and embolism: Secondary | ICD-10-CM | POA: Insufficient documentation

## 2021-07-04 HISTORY — PX: BRONCHIAL WASHINGS: SHX5105

## 2021-07-04 HISTORY — PX: VIDEO BRONCHOSCOPY: SHX5072

## 2021-07-04 HISTORY — PX: HEMOSTASIS CONTROL: SHX6838

## 2021-07-04 HISTORY — PX: BRONCHIAL BIOPSY: SHX5109

## 2021-07-04 LAB — BODY FLUID CELL COUNT WITH DIFFERENTIAL
Lymphs, Fluid: 54 %
Monocyte-Macrophage-Serous Fluid: 19 % — ABNORMAL LOW (ref 50–90)
Neutrophil Count, Fluid: 27 % — ABNORMAL HIGH (ref 0–25)
Total Nucleated Cell Count, Fluid: 14 cu mm (ref 0–1000)

## 2021-07-04 LAB — GLUCOSE, CAPILLARY: Glucose-Capillary: 108 mg/dL — ABNORMAL HIGH (ref 70–99)

## 2021-07-04 SURGERY — BRONCHOSCOPY, WITH FLUOROSCOPY
Anesthesia: General

## 2021-07-04 MED ORDER — SUGAMMADEX SODIUM 200 MG/2ML IV SOLN
INTRAVENOUS | Status: DC | PRN
  Administered 2021-07-04: 150 mg via INTRAVENOUS

## 2021-07-04 MED ORDER — PROPOFOL 10 MG/ML IV BOLUS
INTRAVENOUS | Status: AC
  Filled 2021-07-04: qty 20

## 2021-07-04 MED ORDER — ESMOLOL HCL 100 MG/10ML IV SOLN
INTRAVENOUS | Status: DC | PRN
  Administered 2021-07-04 (×2): 10 mg via INTRAVENOUS

## 2021-07-04 MED ORDER — FENTANYL CITRATE (PF) 100 MCG/2ML IJ SOLN
INTRAMUSCULAR | Status: DC | PRN
  Administered 2021-07-04: 50 ug via INTRAVENOUS

## 2021-07-04 MED ORDER — ROCURONIUM BROMIDE 100 MG/10ML IV SOLN
INTRAVENOUS | Status: DC | PRN
  Administered 2021-07-04: 40 mg via INTRAVENOUS

## 2021-07-04 MED ORDER — PROPOFOL 10 MG/ML IV BOLUS
INTRAVENOUS | Status: DC | PRN
  Administered 2021-07-04: 50 mg via INTRAVENOUS
  Administered 2021-07-04: 100 mg via INTRAVENOUS

## 2021-07-04 MED ORDER — LIDOCAINE 2% (20 MG/ML) 5 ML SYRINGE
INTRAMUSCULAR | Status: DC | PRN
  Administered 2021-07-04: 60 mg via INTRAVENOUS

## 2021-07-04 MED ORDER — FENTANYL CITRATE (PF) 100 MCG/2ML IJ SOLN
INTRAMUSCULAR | Status: AC
  Filled 2021-07-04: qty 2

## 2021-07-04 MED ORDER — LACTATED RINGERS IV SOLN
INTRAVENOUS | Status: DC
  Administered 2021-07-04: 1000 mL via INTRAVENOUS

## 2021-07-04 MED ORDER — SODIUM CHLORIDE (PF) 0.9 % IJ SOLN
PREFILLED_SYRINGE | INTRAMUSCULAR | Status: DC | PRN
  Administered 2021-07-04: 2 mL

## 2021-07-04 MED ORDER — ONDANSETRON HCL 4 MG/2ML IJ SOLN
INTRAMUSCULAR | Status: DC | PRN
  Administered 2021-07-04: 4 mg via INTRAVENOUS

## 2021-07-04 NOTE — Interval H&P Note (Signed)
History and Physical Interval Note:  07/04/2021 11:53 AM  Brooke [name]  has presented today for surgery, with the diagnosis of abnormal chest xray.  The various methods of treatment have been discussed with the patient and family. After consideration of risks, benefits and other options for treatment, the patient has consented to  Procedure(s): VIDEO BRONCHOSCOPY WITH FLUORO (N/A) as a surgical intervention.  The patient's history has been reviewed, patient examined, no change in status, stable for surgery.  I have reviewed the patient's chart and labs.  Questions were answered to the patient's satisfaction.    No AC or AP meds. NPO. Has a ride home. Infrequently needs albuterol. Understands risks and benefits, including risk of nondiagnosis, bleeding, respiratory failure, pneumothorax.   Consent signed. Bronch with fluoro, BAL, TTBx RLL.  [name]

## 2021-07-04 NOTE — Transfer of Care (Signed)
Immediate Anesthesia Transfer of Care Note  Patient: Brooke [name]  Procedure(s) Performed: VIDEO BRONCHOSCOPY WITH FLUORO BRONCHIAL BIOPSIES BRONCHIAL WASHINGS HEMOSTASIS CONTROL  Patient Location: PACU  Anesthesia Type:General  Level of Consciousness: awake and patient cooperative  Airway & Oxygen Therapy: Patient Spontanous Breathing and Patient connected to face mask oxygen  Post-op Assessment: Report given to RN and Post -op Vital signs reviewed and stable  Post vital signs: Reviewed and stable  Last Vitals:  Vitals Value Taken Time  BP 142/113 07/04/21 1255  Temp 36.1 C 07/04/21 1255  Pulse 93 07/04/21 1259  Resp 18 07/04/21 1259  SpO2 100 % 07/04/21 1259  Vitals shown include unvalidated device data.  Last Pain:  Vitals:   07/04/21 1255  TempSrc: Axillary  PainSc: 0-No pain         Complications: No notable events documented.

## 2021-07-04 NOTE — Discharge Summary (Signed)
Physician Discharge Summary  Patient ID: Brooke [name] MRN: [id number] DOB/AGE: [birth year] 76 y.o.  Admit date: 07/04/2021 Discharge date: 07/04/2021  Admission Diagnoses: sarcoidosis, abnormal chest CT  Discharge Diagnoses:  Sarcoidosis, abnormal chest CT   Discharged Condition: stable  Hospital Course: Admitted for bronch, which was completed uneventfully. CXR after with no pneumothorax, consolidation in area of BAL and transbronchial biopsies.  Consults: None  Significant Diagnostic Studies: bronchoscopy with BAL & transbronchial biopsies from RLL. Sent for cell count with diff, cytology, cultures, pathology.  Treatments: anesthesia per [facility]   Disposition: home with daughter. Attempted to call daughter x 3-- left message. Patient has been given discharge instructions verbally and in her discharge summary.  Discharge Instructions     Discharge   Complete by: As directed    After CXR read   Discharge activity:   Complete by: As directed    Discharge instructions   Complete by: As directed    You may not drive for 24 hours after this procedure. You should have someone stay overnight with you tonight. You will likely couch up some blood. Do not worry unless it is increasing in amount or is more than a tablespoon in amount. You may run a fever the night after your bronchoscopy-- this is not a problem, but can be a common side effect. If you run fevers a few days after this can be a sign of pneumonia and you should call the office.      Allergies as of 07/04/2021       Reactions   Codeine Nausea And Vomiting        Medication List     TAKE these medications    acetaminophen 500 MG tablet Commonly known as: TYLENOL Take 500 mg by mouth every 6 (six) hours as needed for moderate pain.   albuterol 108 (90 Base) MCG/ACT inhaler Commonly known as: VENTOLIN HFA INHALE 2 PUFFS BY MOUTH EVERY 6 HOURS AS NEEDED   ALPRAZolam 0.5 MG tablet Commonly known as: XANAX TAKE  1/2 TABLET(0.25 MG) BY MOUTH DAILY AS NEEDED FOR ANXIETY   atorvastatin 40 MG tablet Commonly known as: LIPITOR TAKE 1 TABLET(40 MG) BY MOUTH DAILY   BD Pen Needle Nano 2nd Gen 32G X 4 MM Misc Generic drug: Insulin Pen Needle USE TO INJECT INSULIN AS DIRECTED   blood glucose meter kit and supplies Dispense based on patient and insurance preference. Use up to four times daily as directed. (FOR ICD-10 E10.9, E11.9).   carboxymethylcellulose 0.5 % Soln Commonly known as: REFRESH PLUS Place 1 drop into both eyes daily as needed (dry eyes).   dapagliflozin propanediol 10 MG Tabs tablet Commonly known as: Farxiga Take 1 tablet (10 mg total) by mouth daily.   escitalopram 10 MG tablet Commonly known as: LEXAPRO TAKE 1 TABLET(10 MG) BY MOUTH DAILY   glimepiride 2 MG tablet Commonly known as: AMARYL TAKE 1 TABLET BY MOUTH DAILY WITH BREAKFAST. PLEASE SCHEDULE OVERDUE APPOINTMENT FOR FURTHER REFILLS   Lantus SoloStar 100 UNIT/ML Solostar Pen Generic drug: insulin glargine ADMINISTER 10 TO 25 UNITS UNDER THE SKIN DAILY What changed: See the new instructions.   latanoprost 0.005 % ophthalmic solution Commonly known as: XALATAN 1 drop in both eyes at bedtime   losartan 100 MG tablet Commonly known as: COZAAR TAKE 1 TABLET BY MOUTH EVERY DAY   multivitamin with minerals Tabs tablet Take 1 tablet by mouth daily.   ondansetron 4 MG disintegrating tablet Commonly known as: Zofran ODT Take 1  tablet (4 mg total) by mouth every 8 (eight) hours as needed for nausea or vomiting.   OneTouch Ultra test strip Generic drug: glucose blood TEST FOUR TIMES DAILY AS DIRECTED         Signed: [name] 07/04/2021, 1:42 PM

## 2021-07-04 NOTE — Anesthesia Procedure Notes (Signed)
Procedure Name: Intubation Date/Time: 07/04/2021 12:06 PM Performed by: Gwyndolyn Saxon, CRNA Pre-anesthesia Checklist: Patient identified, Emergency Drugs available, Suction available and Patient being monitored Patient Re-evaluated:Patient Re-evaluated prior to induction Oxygen Delivery Method: Circle system utilized Preoxygenation: Pre-oxygenation with 100% oxygen Induction Type: IV induction Ventilation: Mask ventilation without difficulty Laryngoscope Size: Miller and 2 Grade View: Grade II Tube type: Oral Tube size: 8.0 mm Number of attempts: 1 Airway Equipment and Method: Patient positioned with wedge pillow Placement Confirmation: ETT inserted through vocal cords under direct vision, positive ETCO2 and breath sounds checked- equal and bilateral Secured at: 20 cm Tube secured with: Tape Dental Injury: Teeth and Oropharynx as per pre-operative assessment

## 2021-07-04 NOTE — Anesthesia Postprocedure Evaluation (Signed)
Anesthesia Post Note  Patient: Brooke [name]  Procedure(s) Performed: VIDEO BRONCHOSCOPY WITH FLUORO BRONCHIAL BIOPSIES BRONCHIAL WASHINGS HEMOSTASIS CONTROL     Patient location during evaluation: PACU Anesthesia Type: General Level of consciousness: awake and alert Pain management: pain level controlled Vital Signs Assessment: post-procedure vital signs reviewed and stable Respiratory status: spontaneous breathing, nonlabored ventilation, respiratory function stable and patient connected to nasal cannula oxygen Cardiovascular status: blood pressure returned to baseline and stable Postop Assessment: no apparent nausea or vomiting Anesthetic complications: no   No notable events documented.  Last Vitals:  Vitals:   07/04/21 1325 07/04/21 1335  BP: (!) 156/70 (!) 151/65  Pulse: 86 90  Resp: 12 13  Temp:    SpO2: 96% 96%    Last Pain:  Vitals:   07/04/21 1335  TempSrc:   PainSc: 0-No pain                 Audry Pili

## 2021-07-04 NOTE — Anesthesia Preprocedure Evaluation (Addendum)
Anesthesia Evaluation  Patient identified by MRN, date of birth, ID band Patient awake    Reviewed: Allergy & Precautions, NPO status , Patient's Chart, lab work & pertinent test results  History of Anesthesia Complications Negative for: history of anesthetic complications  Airway Mallampati: I  TM Distance: >3 FB Neck ROM: Full    Dental  (+) Dental Advisory Given, Teeth Intact   Pulmonary former smoker,   Sarcoidosis    Pulmonary exam normal        Cardiovascular hypertension, Pt. on medications + DVT  Normal cardiovascular exam     Neuro/Psych PSYCHIATRIC DISORDERS Anxiety  Neuromuscular disease    GI/Hepatic Neg liver ROS, PUD,   Endo/Other  diabetes, Type 2, Oral Hypoglycemic Agents, Insulin Dependent  Renal/GU negative Renal ROS     Musculoskeletal negative musculoskeletal ROS (+)   Abdominal   Peds  Hematology negative hematology ROS (+)   Anesthesia Other Findings   Reproductive/Obstetrics  Breast cancer                            Anesthesia Physical Anesthesia Plan  ASA: 2  Anesthesia Plan: General   Post-op Pain Management: Minimal or no pain anticipated   Induction: Intravenous  PONV Risk Score and Plan: 3 and Treatment may vary due to age or medical condition and Ondansetron  Airway Management Planned: Oral ETT  Additional Equipment: None  Intra-op Plan:   Post-operative Plan: Extubation in OR  Informed Consent: I have reviewed the patients History and Physical, chart, labs and discussed the procedure including the risks, benefits and alternatives for the proposed anesthesia with the patient or authorized representative who has indicated his/her understanding and acceptance.     Dental advisory given  Plan Discussed with: CRNA and Anesthesiologist  Anesthesia Plan Comments:        Anesthesia Quick Evaluation

## 2021-07-04 NOTE — Op Note (Signed)
Video Bronchoscopy Procedure Note  Date of Operation: 07/04/2021  Pre-op Diagnosis: sarcoidosis,  abnormal imaging  Post-op Diagnosis: Same  Surgeon: [name], DO  Assistants: none  Anesthesia: per anesthesia records  Meds Given: per [facility]  Operation: Flexible video fiberoptic bronchoscopy and biopsies.  Estimated Blood Loss: <52DP  Complications: none noted  Indications and History: Brooke [name] is 76 y.o. with history of sarcoidosis and breast cancer.  Recommendation was to perform video fiberoptic bronchoscopy with biopsies. The risks, benefits, complications, treatment options and expected outcomes were discussed with the patient.  The possibilities of pneumothorax, pneumonia, reaction to medication, pulmonary aspiration, perforation of a viscus, bleeding, failure to diagnose a condition and creating a complication requiring transfusion or operation were discussed with the patient who freely signed the consent.    Description of Procedure: The patient was seen in the Preoperative Area, was examined and was deemed appropriate to proceed.  The patient was taken to endoscopy, identified as Brooke [name] and the procedure verified as Flexible Video Fiberoptic Bronchoscopy with TTBx and BAL.  A Time Out was held and the above information confirmed.   Heavy sedation was initiated as indicated above by Anesthesia. The video fiberoptic bronchoscope was introduced via the ETT and a general inspection was performed which showed normal distal trachea, normal main carina. The R sided airways were inspected and showed normal RUL, BI, RML and RLL. The L side was then inspected. The LLL, Lingular and LUL airways were normal other than mild cobblestoning  Following airway inspection, BAL was performed in the lateral segment of the right lower lobe with 120 cc instilled and 30 cc returned.  Four transbronchial biopsies were obtained under fluoroscopic guidance in the anterior segment of the  right lower lobe.  There was minimal bleeding with appropriate hemostasis with clot.  2 cc of epinephrine diluted in saline was instilled around the clot.  None of this was suctioned out.  No active bleeding at the completion of the procedure.  There was only clotted blood in the anterior right lower lobe segmental bronchus. The patient tolerated the procedure well. The bronchoscope was removed. There were no obvious complications.   Samples: 1. BAL from RLL for culture- routine, AFB, fungal, cell count with diff, cytology 2. Transbronchial biopsies of RLL for cytology  STAT CXR ordered.  Plans:  We will review the cytology, pathology and microbiology results with the patient when they become available.  Outpatient followup will be with Dr Annamaria Boots, who will be copied on results.   [name], DO 07/04/21 12:49 PM [facility]

## 2021-07-04 NOTE — Discharge Instructions (Addendum)
Follow up with Dr. Annamaria Boots in the office. YOU HAD AN ENDOSCOPIC PROCEDURE TODAY: Refer to the procedure report and other information in the discharge instructions given to you for any specific questions about what was found during the examination. If this information does not answer your questions, please call [place] office at [phone] to clarify.   YOU SHOULD EXPECT: Some feelings of bloating in the abdomen. Passage of more gas than usual. Walking can help get rid of the air that was put into your GI tract during the procedure and reduce the bloating. If you had a lower endoscopy (such as a colonoscopy or flexible sigmoidoscopy) you may notice spotting of blood in your stool or on the toilet paper. Some abdominal soreness may be present for a day or two, also.  DIET: Your first meal following the procedure should be a light meal and then it is ok to progress to your normal diet. A half-sandwich or bowl of soup is an example of a good first meal. Heavy or fried foods are harder to digest and may make you feel nauseous or bloated. Drink plenty of fluids but you should avoid alcoholic beverages for 24 hours. If you had a esophageal dilation, please see attached instructions for diet.    ACTIVITY: Your care partner should take you home directly after the procedure. You should plan to take it easy, moving slowly for the rest of the day. You can resume normal activity the day after the procedure however YOU SHOULD NOT DRIVE, use power tools, machinery or perform tasks that involve climbing or major physical exertion for 24 hours (because of the sedation medicines used during the test).   SYMPTOMS TO REPORT IMMEDIATELY: A gastroenterologist can be reached at any hour. Please call [phone]  for any of the following symptoms:  Following lower endoscopy (colonoscopy, flexible sigmoidoscopy) Excessive amounts of blood in the stool  Significant tenderness, worsening of abdominal pains  Swelling of the  abdomen that is new, acute  Fever of 100 or higher  Following upper endoscopy (EGD, EUS, ERCP, esophageal dilation) Vomiting of blood or coffee ground material  New, significant abdominal pain  New, significant chest pain or pain under the shoulder blades  Painful or persistently difficult swallowing  New shortness of breath  Black, tarry-looking or red, bloody stools  FOLLOW UP:  If any biopsies were taken you will be contacted by phone or by letter within the next 1-3 weeks. Call [phone]  if you have not heard about the biopsies in 3 weeks.  Please also call with any specific questions about appointments or follow up tests.

## 2021-07-05 ENCOUNTER — Encounter: Payer: Self-pay | Admitting: *Deleted

## 2021-07-05 ENCOUNTER — Encounter (HOSPITAL_COMMUNITY): Payer: Self-pay | Admitting: Critical Care Medicine

## 2021-07-05 LAB — CYTOLOGY - NON PAP

## 2021-07-05 LAB — SURGICAL PATHOLOGY

## 2021-07-07 ENCOUNTER — Telehealth: Payer: Self-pay | Admitting: Radiation Oncology

## 2021-07-07 LAB — FUNGUS STAIN

## 2021-07-07 LAB — FUNGAL STAIN REFLEX

## 2021-07-07 NOTE — Telephone Encounter (Signed)
I spoke with the patient and reviewed that she had been to PT and now has a wedge insert to try to help her seroma. This is only slightly improved in size since last week. It is still "about the size of a lemon." We discussed moving her simulation out to 07/20/21 to see if her seroma will continue to resolve. She has also reviewed her biopsy results from her bronchoscopy this week. She is hopeful that Dr. Carlis Abbott and Dr. Annamaria Boots will be pleased and just continue to follow this lesion in the right lung.

## 2021-07-09 LAB — AEROBIC/ANAEROBIC CULTURE W GRAM STAIN (SURGICAL/DEEP WOUND): Culture: NO GROWTH

## 2021-07-11 ENCOUNTER — Ambulatory Visit: Payer: Federal, State, Local not specified - PPO | Admitting: Radiation Oncology

## 2021-07-12 LAB — ACID FAST SMEAR (AFB, MYCOBACTERIA): Acid Fast Smear: NEGATIVE

## 2021-07-18 ENCOUNTER — Encounter: Payer: Self-pay | Admitting: *Deleted

## 2021-07-20 ENCOUNTER — Other Ambulatory Visit: Payer: Self-pay

## 2021-07-20 ENCOUNTER — Ambulatory Visit
Admission: RE | Admit: 2021-07-20 | Discharge: 2021-07-20 | Disposition: A | Discharge status: Home / Self Care | Payer: Federal, State, Local not specified - PPO | Source: Ambulatory Visit | Attending: Radiation Oncology | Admitting: Radiation Oncology

## 2021-07-20 DIAGNOSIS — C50211 Malignant neoplasm of upper-inner quadrant of right female breast: Secondary | ICD-10-CM | POA: Insufficient documentation

## 2021-07-20 DIAGNOSIS — Z510 Encounter for antineoplastic radiation therapy: Secondary | ICD-10-CM | POA: Diagnosis present

## 2021-07-20 DIAGNOSIS — Z170 Estrogen receptor positive status [ER+]: Secondary | ICD-10-CM | POA: Insufficient documentation

## 2021-07-22 ENCOUNTER — Telehealth: Payer: Self-pay | Admitting: Genetic Counselor

## 2021-07-22 NOTE — Telephone Encounter (Signed)
Called Brooke [name] to discuss the letter she sent about why she was charged for genetic testing when she declined genetic testing during [facility].  Discussed that germline genetic testing was not ordered and bill represented Oncotype testing, which was recommended by medical oncology based on treatment plan.

## 2021-07-23 ENCOUNTER — Other Ambulatory Visit: Payer: Self-pay | Admitting: Internal Medicine

## 2021-07-27 DIAGNOSIS — Z510 Encounter for antineoplastic radiation therapy: Secondary | ICD-10-CM | POA: Diagnosis not present

## 2021-07-28 ENCOUNTER — Telehealth: Payer: Self-pay

## 2021-07-28 ENCOUNTER — Ambulatory Visit
Admission: RE | Admit: 2021-07-28 | Discharge: 2021-07-28 | Disposition: A | Discharge status: Home / Self Care | Payer: Federal, State, Local not specified - PPO | Source: Ambulatory Visit | Attending: Radiation Oncology | Admitting: Radiation Oncology

## 2021-07-28 ENCOUNTER — Encounter: Payer: Self-pay | Admitting: *Deleted

## 2021-07-28 ENCOUNTER — Other Ambulatory Visit: Payer: Self-pay

## 2021-07-28 DIAGNOSIS — Z510 Encounter for antineoplastic radiation therapy: Secondary | ICD-10-CM | POA: Diagnosis not present

## 2021-07-28 MED ORDER — LANTUS SOLOSTAR 100 UNIT/ML ~~LOC~~ SOPN: 20.0000 [IU] | PEN_INJECTOR | Freq: Every day | SUBCUTANEOUS | 2 refills | Status: DC

## 2021-07-28 NOTE — Telephone Encounter (Signed)
Pt is calling requesting a refill on: LANTUS SOLOSTAR 100 UNIT/ML Solostar Pen  Pharmacy: [facility], [place] [address] AT [place]  LOV: 02/22/21  Pt CB [phone]

## 2021-07-28 NOTE — Progress Notes (Signed)
Pt here for patient teaching.  Pt given Radiation and You booklet, skin care instructions, and Sonafine.  Patient was advised to obtain over the counter aluminum free deodorant.  Reviewed areas of pertinence such as fatigue, hair loss, skin changes, breast tenderness, and breast swelling . Pt able to give teach back of to pat skin and use unscented/gentle soap,apply Sonafine bid, avoid applying anything to skin within 4 hours of treatment, avoid wearing an under wire bra, and to use an electric razor if they must shave. Pt verbalizes understanding of information given and will contact nursing with any questions or concerns.     Gloriajean Dell. Leonie Green, BSN

## 2021-07-28 NOTE — Telephone Encounter (Signed)
Refill has been sent to the pt's pharmacy  

## 2021-07-29 ENCOUNTER — Telehealth: Payer: Self-pay | Admitting: Hematology

## 2021-07-29 ENCOUNTER — Ambulatory Visit
Admission: RE | Admit: 2021-07-29 | Discharge: 2021-07-29 | Disposition: A | Discharge status: Home / Self Care | Payer: Federal, State, Local not specified - PPO | Source: Ambulatory Visit | Attending: Radiation Oncology | Admitting: Radiation Oncology

## 2021-07-29 DIAGNOSIS — Z170 Estrogen receptor positive status [ER+]: Secondary | ICD-10-CM

## 2021-07-29 DIAGNOSIS — Z510 Encounter for antineoplastic radiation therapy: Secondary | ICD-10-CM | POA: Diagnosis not present

## 2021-07-29 MED ORDER — SONAFINE EX EMUL
1.0000 "application " | Freq: Once | CUTANEOUS | Status: AC
  Administered 2021-07-29: 1 via TOPICAL

## 2021-07-29 NOTE — Telephone Encounter (Signed)
Sch per 2/9 inbasket, pt aware °

## 2021-08-01 ENCOUNTER — Ambulatory Visit
Admission: RE | Admit: 2021-08-01 | Discharge: 2021-08-01 | Disposition: A | Discharge status: Home / Self Care | Payer: Federal, State, Local not specified - PPO | Source: Ambulatory Visit | Attending: Radiation Oncology | Admitting: Radiation Oncology

## 2021-08-01 ENCOUNTER — Other Ambulatory Visit: Payer: Self-pay

## 2021-08-01 ENCOUNTER — Telehealth: Payer: Self-pay

## 2021-08-01 DIAGNOSIS — Z510 Encounter for antineoplastic radiation therapy: Secondary | ICD-10-CM | POA: Diagnosis not present

## 2021-08-01 LAB — FUNGUS CULTURE WITH STAIN

## 2021-08-01 LAB — FUNGAL ORGANISM REFLEX

## 2021-08-01 LAB — FUNGUS CULTURE RESULT

## 2021-08-01 NOTE — Telephone Encounter (Signed)
Pt is stating that the Pharmacy told her that Dr. Sharlet Salina needed to fill out some informations before she will be able to get this insulin or change to something on the approved list for [employer].  Pt CB [phone]

## 2021-08-01 NOTE — Telephone Encounter (Signed)
I will keep a eye out for the form.

## 2021-08-02 ENCOUNTER — Ambulatory Visit
Admission: RE | Admit: 2021-08-02 | Discharge: 2021-08-02 | Disposition: A | Discharge status: Home / Self Care | Payer: Federal, State, Local not specified - PPO | Source: Ambulatory Visit | Attending: Radiation Oncology | Admitting: Radiation Oncology

## 2021-08-02 DIAGNOSIS — Z510 Encounter for antineoplastic radiation therapy: Secondary | ICD-10-CM | POA: Diagnosis not present

## 2021-08-02 NOTE — H&P (Signed)
HPI: Admitted for bronchoscopy to evaluate RLL abnormalities on CT scan. Recently diagnosed with breast cancer.  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    Pleural effusion    Sarcoidosis    Past Surgical History:  Procedure Laterality Date   ABDOMINAL HYSTERECTOMY     BREAST LUMPECTOMY WITH RADIOACTIVE SEED LOCALIZATION Right 05/05/2021   Procedure: RIGHT BREAST LUMPECTOMY WITH RADIOACTIVE SEED LOCALIZATION;  Surgeon: Stark Klein, MD;  Location: [place];  Service: General;  Laterality: Right;   BRONCHIAL BIOPSY  07/04/2021   Procedure: BRONCHIAL BIOPSIES;  Surgeon: [name], DO;  [facility];  Service: Cardiopulmonary;;   BRONCHIAL WASHINGS  07/04/2021   Procedure: BRONCHIAL WASHINGS;  Surgeon: [name], DO;  [facility];  Service: Cardiopulmonary;;   CHOLECYSTECTOMY  06/19/1973   COLONOSCOPY     ESOPHAGOGASTRODUODENOSCOPY     ESOPHAGOGASTRODUODENOSCOPY N/A 08/09/2014   Procedure: ESOPHAGOGASTRODUODENOSCOPY (EGD);  Surgeon: Gatha Mayer, MD;  Location[facility] ENDOSCOPY;  Service: Endoscopy;  Laterality: N/A;   FOOT SURGERY  06/20/2011   left   HEMOSTASIS CONTROL  07/04/2021   Procedure: HEMOSTASIS CONTROL;  Surgeon: [name], DO;  [facility];  Service: Cardiopulmonary;;   VIDEO BRONCHOSCOPY N/A 07/04/2021   Procedure: VIDEO BRONCHOSCOPY WITH FLUORO;  Surgeon: [name], DO;  [facility];  Service: Cardiopulmonary;  Laterality: N/A;   Social History   Socioeconomic History   Marital status: Single    Spouse name: Not on file    Number of children: Not on file   Years of education: Not on file   Highest education level: Not on file  Occupational History   Not on file  Tobacco Use   Smoking status: Former    Packs/day: 0.03    Years: 15.00    Pack years: 0.45    Types: Cigarettes    Quit date: 06/19/1982    Years since quitting: 39.1   Smokeless tobacco: Never  Vaping Use   Vaping Use: Never used  Substance and Sexual Activity   Alcohol use: No   Drug use: No   Sexual activity: Not on file  Other Topics Concern   Not on file  Social History Narrative   Not on file   Social Determinants of Health   Financial Resource Strain: Low Risk    Difficulty of Paying Living Expenses: Not very hard  Food Insecurity: No Food Insecurity   Worried About Running Out of Food in the Last Year: Never true   Ran Out of Food in the Last Year: Never true  Transportation Needs: No Transportation Needs   Lack of Transportation (Medical): No   Lack of Transportation (Non-Medical): No  Physical Activity: Not on file  Stress: Not on file  Social Connections: Not on file  Intimate Partner Violence: Not on file   Family History  Problem Relation Age of Onset   Hypertension Mother    Colon cancer Neg Hx       ROS: + SOB, otherwise negative.  BP (!) 151/65    Pulse 90    Temp (!)  97 F (36.1 C) (Axillary)    Resp 13    Ht 5\' 3"  (1.6 m)    Wt 66.9 kg    SpO2 96%    BMI 26.13 kg/m  Elderly woman sitting up in bed in NAD Waucoma/AT, eyes anicteric Breathing comfortably on RA, no wheezing S1S2, RRR Abd soft, NT No peripheral edema Awake, alert, moving all extremities  Imaging reviewed.   Assessment & plan:  Concern for sarcoidosis in RLL with abnormalities on CT scan  -Planning to proceed with bronch. Discussed with patient per previous documentation-- risks include pneumothorax, respiratory failure, failure to obtain diagnosis.  Patient signed consent to proceed.  [name], DO 08/02/21 4:03 PM [place]  [facility]

## 2021-08-03 ENCOUNTER — Other Ambulatory Visit: Payer: Self-pay

## 2021-08-03 ENCOUNTER — Ambulatory Visit
Admission: RE | Admit: 2021-08-03 | Discharge: 2021-08-03 | Disposition: A | Discharge status: Home / Self Care | Payer: Federal, State, Local not specified - PPO | Source: Ambulatory Visit | Attending: Radiation Oncology | Admitting: Radiation Oncology

## 2021-08-03 DIAGNOSIS — Z510 Encounter for antineoplastic radiation therapy: Secondary | ICD-10-CM | POA: Diagnosis not present

## 2021-08-04 ENCOUNTER — Ambulatory Visit
Admission: RE | Admit: 2021-08-04 | Discharge: 2021-08-04 | Disposition: A | Discharge status: Home / Self Care | Payer: Federal, State, Local not specified - PPO | Source: Ambulatory Visit | Attending: Radiation Oncology | Admitting: Radiation Oncology

## 2021-08-04 DIAGNOSIS — Z510 Encounter for antineoplastic radiation therapy: Secondary | ICD-10-CM | POA: Diagnosis not present

## 2021-08-05 ENCOUNTER — Other Ambulatory Visit: Payer: Self-pay

## 2021-08-05 ENCOUNTER — Ambulatory Visit
Admission: RE | Admit: 2021-08-05 | Discharge: 2021-08-05 | Disposition: A | Discharge status: Home / Self Care | Payer: Federal, State, Local not specified - PPO | Source: Ambulatory Visit | Attending: Radiation Oncology | Admitting: Radiation Oncology

## 2021-08-05 DIAGNOSIS — Z510 Encounter for antineoplastic radiation therapy: Secondary | ICD-10-CM | POA: Diagnosis not present

## 2021-08-07 ENCOUNTER — Encounter: Payer: Self-pay | Admitting: Internal Medicine

## 2021-08-08 ENCOUNTER — Other Ambulatory Visit: Payer: Self-pay

## 2021-08-08 ENCOUNTER — Ambulatory Visit
Admission: RE | Admit: 2021-08-08 | Discharge: 2021-08-08 | Disposition: A | Discharge status: Home / Self Care | Payer: Federal, State, Local not specified - PPO | Source: Ambulatory Visit | Attending: Radiation Oncology | Admitting: Radiation Oncology

## 2021-08-08 DIAGNOSIS — Z510 Encounter for antineoplastic radiation therapy: Secondary | ICD-10-CM | POA: Diagnosis not present

## 2021-08-09 ENCOUNTER — Ambulatory Visit
Admission: RE | Admit: 2021-08-09 | Discharge: 2021-08-09 | Disposition: A | Discharge status: Home / Self Care | Payer: Federal, State, Local not specified - PPO | Source: Ambulatory Visit | Attending: Radiation Oncology | Admitting: Radiation Oncology

## 2021-08-09 DIAGNOSIS — Z510 Encounter for antineoplastic radiation therapy: Secondary | ICD-10-CM | POA: Diagnosis not present

## 2021-08-10 ENCOUNTER — Ambulatory Visit
Admission: RE | Admit: 2021-08-10 | Discharge: 2021-08-10 | Disposition: A | Discharge status: Home / Self Care | Payer: Federal, State, Local not specified - PPO | Source: Ambulatory Visit | Attending: Radiation Oncology | Admitting: Radiation Oncology

## 2021-08-10 ENCOUNTER — Other Ambulatory Visit: Payer: Self-pay

## 2021-08-10 DIAGNOSIS — Z510 Encounter for antineoplastic radiation therapy: Secondary | ICD-10-CM | POA: Diagnosis not present

## 2021-08-11 ENCOUNTER — Ambulatory Visit
Admission: RE | Admit: 2021-08-11 | Discharge: 2021-08-11 | Disposition: A | Discharge status: Home / Self Care | Payer: Federal, State, Local not specified - PPO | Source: Ambulatory Visit | Attending: Radiation Oncology | Admitting: Radiation Oncology

## 2021-08-11 DIAGNOSIS — Z510 Encounter for antineoplastic radiation therapy: Secondary | ICD-10-CM | POA: Diagnosis not present

## 2021-08-12 ENCOUNTER — Other Ambulatory Visit: Payer: Self-pay

## 2021-08-12 ENCOUNTER — Ambulatory Visit: Payer: Federal, State, Local not specified - PPO | Admitting: Radiation Oncology

## 2021-08-12 ENCOUNTER — Ambulatory Visit
Admission: RE | Admit: 2021-08-12 | Discharge: 2021-08-12 | Disposition: A | Discharge status: Home / Self Care | Payer: Federal, State, Local not specified - PPO | Source: Ambulatory Visit | Attending: Radiation Oncology | Admitting: Radiation Oncology

## 2021-08-12 DIAGNOSIS — Z510 Encounter for antineoplastic radiation therapy: Secondary | ICD-10-CM | POA: Diagnosis not present

## 2021-08-15 ENCOUNTER — Other Ambulatory Visit: Payer: Self-pay

## 2021-08-15 ENCOUNTER — Ambulatory Visit
Admission: RE | Admit: 2021-08-15 | Discharge: 2021-08-15 | Disposition: A | Discharge status: Home / Self Care | Payer: Federal, State, Local not specified - PPO | Source: Ambulatory Visit | Attending: Radiation Oncology | Admitting: Radiation Oncology

## 2021-08-15 DIAGNOSIS — Z510 Encounter for antineoplastic radiation therapy: Secondary | ICD-10-CM | POA: Diagnosis not present

## 2021-08-16 ENCOUNTER — Encounter (HOSPITAL_COMMUNITY): Payer: Self-pay

## 2021-08-16 ENCOUNTER — Ambulatory Visit
Admission: RE | Admit: 2021-08-16 | Discharge: 2021-08-16 | Disposition: A | Discharge status: Home / Self Care | Payer: Federal, State, Local not specified - PPO | Source: Ambulatory Visit | Attending: Radiation Oncology | Admitting: Radiation Oncology

## 2021-08-16 DIAGNOSIS — Z510 Encounter for antineoplastic radiation therapy: Secondary | ICD-10-CM | POA: Diagnosis not present

## 2021-08-17 ENCOUNTER — Ambulatory Visit
Admission: RE | Admit: 2021-08-17 | Discharge: 2021-08-17 | Disposition: A | Discharge status: Home / Self Care | Payer: Federal, State, Local not specified - PPO | Source: Ambulatory Visit | Attending: Radiation Oncology | Admitting: Radiation Oncology

## 2021-08-17 ENCOUNTER — Other Ambulatory Visit: Payer: Self-pay

## 2021-08-17 DIAGNOSIS — Z170 Estrogen receptor positive status [ER+]: Secondary | ICD-10-CM | POA: Diagnosis not present

## 2021-08-17 DIAGNOSIS — Z510 Encounter for antineoplastic radiation therapy: Secondary | ICD-10-CM | POA: Insufficient documentation

## 2021-08-17 DIAGNOSIS — C50211 Malignant neoplasm of upper-inner quadrant of right female breast: Secondary | ICD-10-CM | POA: Insufficient documentation

## 2021-08-18 ENCOUNTER — Ambulatory Visit
Admission: RE | Admit: 2021-08-18 | Discharge: 2021-08-18 | Disposition: A | Discharge status: Home / Self Care | Payer: Federal, State, Local not specified - PPO | Source: Ambulatory Visit | Attending: Radiation Oncology | Admitting: Radiation Oncology

## 2021-08-18 ENCOUNTER — Encounter: Payer: Self-pay | Admitting: Internal Medicine

## 2021-08-18 ENCOUNTER — Other Ambulatory Visit: Payer: Self-pay | Admitting: Internal Medicine

## 2021-08-18 DIAGNOSIS — Z510 Encounter for antineoplastic radiation therapy: Secondary | ICD-10-CM | POA: Diagnosis not present

## 2021-08-19 ENCOUNTER — Other Ambulatory Visit: Payer: Self-pay

## 2021-08-19 ENCOUNTER — Ambulatory Visit
Admission: RE | Admit: 2021-08-19 | Discharge: 2021-08-19 | Disposition: A | Discharge status: Home / Self Care | Payer: Federal, State, Local not specified - PPO | Source: Ambulatory Visit | Attending: Radiation Oncology | Admitting: Radiation Oncology

## 2021-08-19 ENCOUNTER — Other Ambulatory Visit: Payer: Self-pay | Admitting: Internal Medicine

## 2021-08-19 DIAGNOSIS — Z510 Encounter for antineoplastic radiation therapy: Secondary | ICD-10-CM | POA: Diagnosis not present

## 2021-08-22 ENCOUNTER — Ambulatory Visit
Admission: RE | Admit: 2021-08-22 | Discharge: 2021-08-22 | Disposition: A | Discharge status: Home / Self Care | Payer: Federal, State, Local not specified - PPO | Source: Ambulatory Visit | Attending: Radiation Oncology | Admitting: Radiation Oncology

## 2021-08-22 ENCOUNTER — Other Ambulatory Visit: Payer: Self-pay

## 2021-08-22 DIAGNOSIS — Z510 Encounter for antineoplastic radiation therapy: Secondary | ICD-10-CM | POA: Diagnosis not present

## 2021-08-22 MED ORDER — TRESIBA FLEXTOUCH 100 UNIT/ML ~~LOC~~ SOPN: 20.0000 [IU] | PEN_INJECTOR | Freq: Every day | SUBCUTANEOUS | 3 refills | Status: DC

## 2021-08-23 ENCOUNTER — Ambulatory Visit
Admission: RE | Admit: 2021-08-23 | Discharge: 2021-08-23 | Disposition: A | Discharge status: Home / Self Care | Payer: Federal, State, Local not specified - PPO | Source: Ambulatory Visit | Attending: Radiation Oncology | Admitting: Radiation Oncology

## 2021-08-23 ENCOUNTER — Encounter: Payer: Self-pay | Admitting: *Deleted

## 2021-08-23 DIAGNOSIS — Z510 Encounter for antineoplastic radiation therapy: Secondary | ICD-10-CM | POA: Diagnosis not present

## 2021-08-23 DIAGNOSIS — C50211 Malignant neoplasm of upper-inner quadrant of right female breast: Secondary | ICD-10-CM

## 2021-08-23 DIAGNOSIS — Z170 Estrogen receptor positive status [ER+]: Secondary | ICD-10-CM

## 2021-08-24 ENCOUNTER — Other Ambulatory Visit: Payer: Self-pay

## 2021-08-24 ENCOUNTER — Ambulatory Visit
Admission: RE | Admit: 2021-08-24 | Discharge: 2021-08-24 | Disposition: A | Discharge status: Home / Self Care | Payer: Federal, State, Local not specified - PPO | Source: Ambulatory Visit | Attending: Radiation Oncology | Admitting: Radiation Oncology

## 2021-08-24 ENCOUNTER — Encounter: Payer: Self-pay | Admitting: Radiation Oncology

## 2021-08-24 DIAGNOSIS — Z510 Encounter for antineoplastic radiation therapy: Secondary | ICD-10-CM | POA: Diagnosis not present

## 2021-08-25 NOTE — Progress Notes (Signed)
? ?                                                                                                                                                          ?  Patient Name: Brooke [name] ?MRN: [id number] ?DOB: 1945/12/08 ?Referring Physician: Vertell Novak (Profile Not Attached) ?Date of Service: 08/24/2021 ?[facility], [place] ? ?                                                      End Of Treatment Note ? 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 ?Cancer Staging:   Stage IA, cT1c, cN0M0 grade 1, ER/PR positive invasive ductal carcinoma of the right breast ? ?Intent: Curative ? ?Radiation Treatment Dates: 07/28/2021 through 08/24/2021 ?Site Technique Total Dose (Gy) Dose per Fx (Gy) Completed Fx Beam Energies  ?Breast, Right: Breast_R 3D 42.56/42.56 2.66 16/16 6X, 10X  ?Breast, Right: Breast_R_Bst 3D 10/10 2.5 4/4 6X, 10X  ? ?Narrative: The patient tolerated radiation therapy relatively well. She developed fatigue and anticipated skin changes in the treatment field.  ? ?Plan: The patient will receive a call in about one month from the radiation oncology department. She will continue follow up with Dr. Burr Medico as well.  ?________________________________________________ ? ? ? ?Carola Rhine, PAC  ?

## 2021-08-26 ENCOUNTER — Telehealth: Payer: Self-pay

## 2021-08-26 ENCOUNTER — Inpatient Hospital Stay: Payer: Federal, State, Local not specified - PPO | Admitting: Hematology

## 2021-08-26 LAB — ACID FAST CULTURE WITH REFLEXED SENSITIVITIES (MYCOBACTERIA): Acid Fast Culture: NEGATIVE

## 2021-08-26 NOTE — Telephone Encounter (Signed)
This nurse reached out to patient per provider request to see if patient is willing to move appointment from today to 3/30.  Patient is in agreement with moving appointment.  This nurse advised of new appointment of 3/30 at 11:20 am.  No further questions or concerns at this time.   ?

## 2021-08-29 ENCOUNTER — Other Ambulatory Visit: Payer: Self-pay

## 2021-08-29 ENCOUNTER — Other Ambulatory Visit: Payer: Self-pay | Admitting: Internal Medicine

## 2021-08-29 ENCOUNTER — Ambulatory Visit
Admission: RE | Admit: 2021-08-29 | Discharge: 2021-08-29 | Disposition: A | Discharge status: Home / Self Care | Payer: Federal, State, Local not specified - PPO | Source: Ambulatory Visit | Attending: Internal Medicine | Admitting: Internal Medicine

## 2021-08-29 DIAGNOSIS — R911 Solitary pulmonary nodule: Secondary | ICD-10-CM

## 2021-09-01 ENCOUNTER — Ambulatory Visit: Payer: Federal, State, Local not specified - PPO | Admitting: Internal Medicine

## 2021-09-01 ENCOUNTER — Other Ambulatory Visit: Payer: Self-pay

## 2021-09-01 ENCOUNTER — Encounter: Payer: Self-pay | Admitting: Internal Medicine

## 2021-09-01 VITALS — BP 122/68 | HR 72 | Resp 18 | Ht 63.0 in | Wt 147.6 lb

## 2021-09-01 DIAGNOSIS — H42 Glaucoma in diseases classified elsewhere: Secondary | ICD-10-CM

## 2021-09-01 DIAGNOSIS — R918 Other nonspecific abnormal finding of lung field: Secondary | ICD-10-CM

## 2021-09-01 DIAGNOSIS — E1139 Type 2 diabetes mellitus with other diabetic ophthalmic complication: Secondary | ICD-10-CM

## 2021-09-01 LAB — POCT GLYCOSYLATED HEMOGLOBIN (HGB A1C): Hemoglobin A1C: 8.5 % — AB (ref 4.0–5.6)

## 2021-09-01 NOTE — Progress Notes (Signed)
? ?  Subjective:  ? ?Patient ID: Brooke [name], female    DOB: 12-27-1945, 76 y.o.   MRN: [id number] ? ?HPI ?The patient is a 76 YO female coming in for follow up. ? ?Review of Systems  ?Constitutional:  Positive for fatigue.  ?HENT: Negative.    ?Eyes: Negative.   ?Respiratory:  Negative for cough, chest tightness and shortness of breath.   ?Cardiovascular:  Negative for chest pain, palpitations and leg swelling.  ?Gastrointestinal:  Negative for abdominal distention, abdominal pain, constipation, diarrhea, nausea and vomiting.  ?Musculoskeletal: Negative.   ?Skin: Negative.   ?Neurological: Negative.   ?Psychiatric/Behavioral: Negative.    ? ?Objective:  ?Physical Exam ?Constitutional:   ?   Appearance: She is well-developed.  ?HENT:  ?   Head: Normocephalic and atraumatic.  ?Cardiovascular:  ?   Rate and Rhythm: Normal rate and regular rhythm.  ?Pulmonary:  ?   Effort: Pulmonary effort is normal. No respiratory distress.  ?   Breath sounds: Normal breath sounds. No wheezing or rales.  ?Abdominal:  ?   General: Bowel sounds are normal. There is no distension.  ?   Palpations: Abdomen is soft.  ?   Tenderness: There is no abdominal tenderness. There is no rebound.  ?Musculoskeletal:  ?   Cervical back: Normal range of motion.  ?Skin: ?   General: Skin is warm and dry.  ?Neurological:  ?   Mental Status: She is alert and oriented to person, place, and time.  ?   Coordination: Coordination normal.  ? ? ?Vitals:  ? 09/01/21 0916  ?BP: 122/68  ?Pulse: 72  ?Resp: 18  ?SpO2: 98%  ?Weight: 147 lb 9.6 oz (67 kg)  ?Height: '5\' 3"'$  (1.6 m)  ? ? ?This visit occurred during the SARS-CoV-2 public health emergency.  Safety protocols were in place, including screening questions prior to the visit, additional usage of staff PPE, and extensive cleaning of exam room while observing appropriate contact time as indicated for disinfecting solutions.  ? ?Assessment & Plan:  ?Visit time 20 minutes in face to face communication with patient  and coordination of care, additional 15 minutes spent in record review, coordination or care, ordering tests, communicating/referring to other healthcare professionals, documenting in medical records all on the same day of the visit for total time 35 minutes spent on the visit.  ? ?

## 2021-09-01 NOTE — Assessment & Plan Note (Signed)
Prior bronchoscopy with biopsy RLL in Jan and reviewed findings with her today no cancer cells found which does not totally exclude malignancy. She had CT chest this week from pulmonary and has not heard about results. Did discuss growth of nodules but more appearance of inflammatory or infectious. Given that she just completed radiation of the right chest wall for breast cancer it makes inflammatory more likely. Will let her pulmonary provider know that she has not gotten results/interpretation of any results thus far to see if they can sit down with her and discuss plan for interval monitoring.  ?

## 2021-09-01 NOTE — Assessment & Plan Note (Signed)
POC HgA1c checked today at 8.5 which is improving. She has struggled with cancer surgery and radiation since our last visit. We will plan to keep medications the same for now since she is improving and will now have more energy to work on diet and exercise. Continue farxiga 10 mg daily and amaryl 2 mg daily and tresiba 20 units daily. No low sugars she will continue to monitor sugar at least daily. Return in 3 months. Mildly uncontrolled.  ?

## 2021-09-02 ENCOUNTER — Telehealth: Payer: Self-pay | Admitting: Internal Medicine

## 2021-09-02 DIAGNOSIS — R918 Other nonspecific abnormal finding of lung field: Secondary | ICD-10-CM

## 2021-09-02 NOTE — Telephone Encounter (Signed)
Called and spoke with pt letting her know the results from [place] per CY. Pt verbalized understanding. Order for HRCT has been placed. Nothing further needed. ?

## 2021-09-02 NOTE — Telephone Encounter (Signed)
Bronchoscopy results were benign without active infection identified. ?Recent CT shows some progression of nodules, favoring inflammatory or infection, but not specific. ?I discussed these results with Brooke [name]. Eventually VATs bx may be recommended. She would prefer to wait, meet with Oncology, and schedule a f/u CT in 4 months to assess nodule progression. ? ?Order- Please schedule HRCT chest in 4 months for dx bilateral lung nodules ?

## 2021-09-14 ENCOUNTER — Other Ambulatory Visit: Payer: Self-pay | Admitting: Internal Medicine

## 2021-09-15 ENCOUNTER — Inpatient Hospital Stay: Payer: Federal, State, Local not specified - PPO | Attending: Hematology | Admitting: Hematology

## 2021-09-15 ENCOUNTER — Encounter: Payer: Self-pay | Admitting: Hematology

## 2021-09-15 ENCOUNTER — Other Ambulatory Visit: Payer: Self-pay

## 2021-09-15 VITALS — BP 147/82 | HR 86 | Temp 98.6°F | Resp 19 | Ht 63.0 in | Wt 148.3 lb

## 2021-09-15 DIAGNOSIS — Z79899 Other long term (current) drug therapy: Secondary | ICD-10-CM | POA: Diagnosis not present

## 2021-09-15 DIAGNOSIS — E2839 Other primary ovarian failure: Secondary | ICD-10-CM

## 2021-09-15 DIAGNOSIS — C50211 Malignant neoplasm of upper-inner quadrant of right female breast: Secondary | ICD-10-CM | POA: Insufficient documentation

## 2021-09-15 DIAGNOSIS — Z170 Estrogen receptor positive status [ER+]: Secondary | ICD-10-CM | POA: Insufficient documentation

## 2021-09-15 DIAGNOSIS — Z923 Personal history of irradiation: Secondary | ICD-10-CM | POA: Diagnosis not present

## 2021-09-15 DIAGNOSIS — D869 Sarcoidosis, unspecified: Secondary | ICD-10-CM | POA: Insufficient documentation

## 2021-09-15 MED ORDER — ANASTROZOLE 1 MG PO TABS: 1.0000 mg | ORAL_TABLET | Freq: Every day | ORAL | 3 refills | Status: DC

## 2021-09-15 NOTE — Progress Notes (Signed)
?Brooke [name]   ?[phone] [phone]   ?Clinic Follow up Note  ? ?Patient Care Team: ?Hoyt Koch, MD as PCP - General (Internal Medicine) ?Stark Klein, MD as Consulting Physician (General Surgery) ?Truitt Merle, MD as Consulting Physician (Hematology) ?Kyung Rudd, MD as Consulting Physician (Radiation Oncology) ?Rockwell Germany, RN as Oncology Nurse Navigator ?Mauro Kaufmann, RN as Oncology Nurse Navigator ? ?Date of Service:  09/15/2021 ? ?CHIEF COMPLAINT: f/u of right breast cancer ? ?CURRENT THERAPY:  ?To start anastrozole ? ?ASSESSMENT & PLAN:  ?Brooke [name] is a 76 y.o. post-menopausal female with  ? ?1. Malignant neoplasm of upper-inner quadrant of right breast, Stage IA, c(T1b, N0), ER+/PR+/HER2-, Grade 1 ?-found on screening mammogram. Biopsy 04/07/21 confirmed IDC, grade 1. ?-right lumpectomy on 05/05/21 by Dr. Barry Dienes showed 1.4 cm IDC. Margins uninvolved. ?-she received adjuvant radiation 2/9-08/24/21 under Dr. Lisbeth Renshaw. ?-Given the strong ER and PR positivity and her h/o blood clots, I recommend adjuvant aromatase inhibitor to reduce her risk of cancer recurrence,  The potential benefit and side effects, which includes but not limited to, hot flash, skin and vaginal dryness, metabolic changes ( increased blood glucose, cholesterol, weight, etc.), slightly in increased risk of cardiovascular disease, cataracts, muscular and joint discomfort, osteopenia and osteoporosis, etc, were discussed with her in great details. She is interested and can start any time. ?-We also discussed the breast cancer surveillance after her surgery. She will continue annual screening mammogram, self exam, and a routine office visit with lab and exam with [place]. ?-I encouraged her to have healthy diet and exercise regularly.  ?  ?2. Bone Health  ?-I recommend obtaining repeat DEXA for new baseline. I ordered for her today. ? ?3. Sarcoidosis, B/l Lung Base Nodularity  ?-nodularity seen  on chest CT on 05/04/21 for interstitial lung disease. ?-PET scan on 05/23/22 showed hypermetabolic foci of consolidation at bilateral lung bases, favoring infectious or inflammatory process. ?-bronchoscopy was performed on 07/04/21 by Dr. Carlis Abbott. Pathology from RLL was negative. ?-nodularity is being followed by Dr. Annamaria Boots. Most recent CT chest on 08/29/21 showed interval increase in nodular consolidations. ?  ?  ?PLAN:  ?-I called in anastrozole for her to start next week  ?-DEXA to be done in next month ?-survivorship in 3 months ?-lab and f/u in 6 months ? ? ?No problem-specific Assessment & Plan notes found for this encounter. ? ? ?SUMMARY OF ONCOLOGIC HISTORY: ?Oncology History Overview Note  ?Cancer Staging ?Malignant neoplasm of upper-inner quadrant of right breast in female, estrogen receptor positive ([place]) ?Staging form: Breast, AJCC 8th Edition ?- Clinical stage from 04/07/2021: Stage IA (cT1b, cN0, cM0, G1, ER+, PR+, HER2-) - Signed by Truitt Merle, MD on 04/19/2021 ? ?  ?Malignant neoplasm of upper-inner quadrant of right breast in female, estrogen receptor positive ([place])  ?03/29/2021 Mammogram  ? Right Diagnostic MM and Right Breast US ? ?There is a 0.6 cm x 0.9 cm x 0.5 cm irregular mass with a spiculated margin in the right breast at 2 o'clock posterior depth 11 cm from the nipple. This irregular mass is hypoechoic with posterior acoustic shadowing. ?No significant abnormalities were seen sonographically in the right axilla. ?  ?04/07/2021 Cancer Staging  ? Staging form: Breast, AJCC 8th Edition ?- Clinical stage from 04/07/2021: Stage IA (cT1b, cN0, cM0, G1, ER+, PR+, HER2-) - Signed by Truitt Merle, MD on 04/19/2021 ?Stage prefix: Initial diagnosis ?Histologic grading system: 3 grade system ? ?  ?04/07/2021 Pathology Results  ?  Diagnosis ?Breast, right, needle core biopsy, 2:00 o'clock 11cmfn mass ?- INVASIVE DUCTAL CARCINOMA ?- FIBROADENOMATOID NODULE WITH CALCIFICATIONS ?- SEE COMMENT ?Microscopic  Comment ?Based on the biopsy, the carcinoma appears Nottingham grade 1 of 3 and measures 0.4 cm in greatest linear extent. ? ?PROGNOSTIC INDICATORS ?Results: ?The tumor cells are EQUIVOCAL for Her2 (2+). Her2 by FISH will be performed and results reported separately. ?Estrogen Receptor: 100%, POSITIVE, STRONG STAINING INTENSITY ?Progesterone Receptor: 50%, POSITIVE, MODERATE STAINING INTENSITY ?Proliferation Marker Ki67: 2% ? ?FLUORESCENCE IN-SITU HYBRIDIZATION ?Results: ?GROUP 4: HER2 **NEGATIVE** ?  ?04/15/2021 Initial Diagnosis  ? Malignant neoplasm of upper-inner quadrant of right breast in female, estrogen receptor positive ([place]) ?  ?05/05/2021 Cancer Staging  ? Staging form: Breast, AJCC 8th Edition ?- Pathologic stage from 05/05/2021: Stage Unknown (pT1c, pNX, cM0, G1, ER+, PR+, HER2-, Oncotype DX score: 23) - Signed by Truitt Merle, MD on 08/26/2021 ?Stage prefix: Initial diagnosis ?Multigene prognostic tests performed: Oncotype DX ?Recurrence score range: Greater than or equal to 11 ?Histologic grading system: 3 grade system ?Residual tumor (R): R0 - None ? ?  ?05/05/2021 Definitive Surgery  ? FINAL MICROSCOPIC DIAGNOSIS:  ? ?A. BREAST, RIGHT, LUMPECTOMY:  ?-  Invasive ductal carcinoma, Nottingham grade 1 of 3, 1.4 cm  ?-  Margins uninvolved by carcinoma (<0.1cm; inferior margin; see part B)  ?-  Previous biopsy site changes present  ?-  See oncology table below  ? ?B. BREAST, RIGHT INFERIOR MARGIN, EXCISION:  ?-  No residual carcinoma identified  ? ?C. BREAST, RIGHT MEDIAL MARGIN, EXCISION:  ?-  No residual carcinoma identified  ?  ?07/28/2021 - 08/24/2021 Radiation Therapy  ? Site Technique Total Dose (Gy) Dose per Fx (Gy) Completed Fx Beam Energies  ?Breast, Right: Breast_R 3D 42.56/42.56 2.66 16/16 6X, 10X  ?Breast, Right: Breast_R_Bst 3D 10/10 2.5 4/4 6X, 10X  ? ?  ? ? ? ?INTERVAL HISTORY:  ?[name] is here for a follow up of breast cancer. She was last seen by me on 04/20/21 in consultation. She  presents to the clinic alone. ?She reports she has done well with surgery and radiation. She notes she still has some residual skin darkening from radiation. ?  ?All other systems were reviewed with the patient and are negative. ? ?MEDICAL HISTORY:  ?Past Medical History:  ?Diagnosis Date  ? Allergic rhinitis   ? Anxiety   ? Diabetes mellitus without complication ([place])   ? DVT (deep venous thrombosis) ([place])   ? Erosive gastritis with hemorrhage 08/09/2014  ? Hypertension   ? Malignant neoplasm of upper-inner quadrant of right breast in female, estrogen receptor positive ([place]) 04/15/2021  ? Peripheral neuropathy   ? uncertain relation to lung disease  ? 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 Pleural effusion   ? Sarcoidosis   ? ? ?SURGICAL HISTORY: ?Past Surgical History:  ?Procedure Laterality Date  ? ABDOMINAL HYSTERECTOMY    ? BREAST LUMPECTOMY WITH RADIOACTIVE SEED LOCALIZATION Right 05/05/2021  ? Procedure: RIGHT BREAST LUMPECTOMY WITH RADIOACTIVE SEED LOCALIZATION;  Surgeon: Stark Klein, MD;  Location: [place];  Service: General;  Laterality: Right;  ? BRONCHIAL BIOPSY  07/04/2021  ? Procedure: BRONCHIAL BIOPSIES;  Surgeon: [name], DO;  [facility];  Service: Cardiopulmonary;;  ? BRONCHIAL WASHINGS  07/04/2021  ? Procedure: BRONCHIAL WASHINGS;  Surgeon: [name], DO;  [facility];  Service: Cardiopulmonary;;  ? CHOLECYSTECTOMY  06/19/1973  ? COLONOSCOPY    ? ESOPHAGOGASTRODUODENOSCOPY    ? ESOPHAGOGASTRODUODENOSCOPY N/A  08/09/2014  ? Procedure: ESOPHAGOGASTRODUODENOSCOPY (EGD);  Surgeon: Gatha Mayer, MD;  Location[facility] ENDOSCOPY;  Service: Endoscopy;  Laterality: N/A;  ? FOOT SURGERY  06/20/2011  ? left  ? HEMOSTASIS CONTROL  07/04/2021  ? Procedure: HEMOSTASIS CONTROL;  Surgeon: [name], DO;  [facility];  Service: Cardiopulmonary;;  ? VIDEO BRONCHOSCOPY N/A 07/04/2021  ? Procedure: VIDEO BRONCHOSCOPY WITH FLUORO;  Surgeon: [name], DO;  [facility];  Service: Cardiopulmonary;  Laterality: N/A;  ? ? ?I have reviewed the social history and family history with the patient and they are unchanged from previous note. ? ?ALLERGIES:  is allergic to codeine. ?

## 2021-09-16 ENCOUNTER — Encounter: Payer: Self-pay | Admitting: Hematology

## 2021-09-16 ENCOUNTER — Other Ambulatory Visit: Payer: Self-pay

## 2021-09-16 DIAGNOSIS — C50211 Malignant neoplasm of upper-inner quadrant of right female breast: Secondary | ICD-10-CM

## 2021-09-16 DIAGNOSIS — E2839 Other primary ovarian failure: Secondary | ICD-10-CM

## 2021-09-22 ENCOUNTER — Other Ambulatory Visit: Payer: Self-pay | Admitting: Internal Medicine

## 2021-09-26 ENCOUNTER — Ambulatory Visit
Admission: RE | Admit: 2021-09-26 | Discharge: 2021-09-26 | Disposition: A | Discharge status: Home / Self Care | Payer: Federal, State, Local not specified - PPO | Source: Ambulatory Visit | Attending: Hematology | Admitting: Hematology

## 2021-09-26 DIAGNOSIS — Z170 Estrogen receptor positive status [ER+]: Secondary | ICD-10-CM

## 2021-09-26 NOTE — Progress Notes (Signed)
?  Radiation Oncology         [phone] ?________________________________ ? ?Name: Brooke [name] MRN: [id number]  ?Date of Service: 09/26/2021  DOB: 06-Oct-1945 ? ?Post Treatment Telephone Note ? ?Diagnosis:   Stage IA, cT1c, cN0M0 grade 1, ER/PR positive invasive ductal carcinoma of the right breast ? ?Intent: Curative ? ?Radiation Treatment Dates: 07/28/2021 through 08/24/2021 ?Site Technique Total Dose (Gy) Dose per Fx (Gy) Completed Fx Beam Energies  ?Breast, Right: Breast_R 3D 42.56/42.56 2.66 16/16 6X, 10X  ?Breast, Right: Breast_R_Bst 3D 10/10 2.5 4/4 6X, 10X  ? ?Narrative: The patient tolerated radiation therapy relatively well. She developed fatigue and anticipated skin changes in the treatment field. She's still having some hyperpigmentation. She's going to start antiestrogen in the near future but wants to see how she's feeling for a few more weeks before doing so. ? ? ?Impression/Plan: ?1. Stage IA, cT1c, cN0M0 grade 1, ER/PR positive invasive ductal carcinoma of the right breast. The patient has been doing well since completion of radiotherapy. We discussed that we would be happy to continue to follow her as needed, but she will also continue to follow up with Dr. Burr Medico in medical oncology. She was counseled on skin care as well as measures to avoid sun exposure to this area.  ?2. Survivorship. We discussed the importance of survivorship evaluation and encouraged her to attend her upcoming visit with that clinic. ? ? ? ? ? ?Carola Rhine, PAC  ? ? ? ? ?

## 2021-10-07 ENCOUNTER — Telehealth: Payer: Self-pay | Admitting: Internal Medicine

## 2021-10-07 NOTE — Telephone Encounter (Signed)
Spoke with pt who states that a bill here name was sent to Medicare Part B which the pt doesn't even have and pt wanted to know how to get the bill to the right insurance. Pt was given billing phone number. Pt stated understanding. Pt thought she had been referred to Dr. Carlis Abbott when it was a bill for patient interaction in the hospital. Nothing further needed at this time.   ?

## 2021-11-30 ENCOUNTER — Other Ambulatory Visit: Payer: Self-pay | Admitting: Internal Medicine

## 2021-12-06 ENCOUNTER — Telehealth: Payer: Self-pay | Admitting: *Deleted

## 2021-12-06 NOTE — Telephone Encounter (Signed)
Rec'd fax PA pt need approval for Farxiga 10 mg. This PA can not do on cover-my-meds. Called [phone] spoke w/ Angelica gave her pt information. PA was approve starting 11/06/21 - 11/26/22. Faxing approval to pharmacy...Johny Chess

## 2021-12-09 ENCOUNTER — Telehealth: Payer: Self-pay | Admitting: *Deleted

## 2021-12-11 ENCOUNTER — Encounter: Payer: Self-pay | Admitting: Nurse Practitioner

## 2021-12-12 ENCOUNTER — Inpatient Hospital Stay: Payer: Federal, State, Local not specified - PPO | Attending: Nurse Practitioner | Admitting: Nurse Practitioner

## 2021-12-12 ENCOUNTER — Encounter: Payer: Self-pay | Admitting: Nurse Practitioner

## 2021-12-12 ENCOUNTER — Other Ambulatory Visit: Payer: Self-pay

## 2021-12-12 VITALS — BP 160/73 | HR 72 | Temp 98.4°F | Resp 16 | Ht 63.0 in | Wt 151.7 lb

## 2021-12-12 DIAGNOSIS — Z79899 Other long term (current) drug therapy: Secondary | ICD-10-CM | POA: Diagnosis not present

## 2021-12-12 DIAGNOSIS — C50211 Malignant neoplasm of upper-inner quadrant of right female breast: Secondary | ICD-10-CM | POA: Diagnosis present

## 2021-12-12 DIAGNOSIS — M2550 Pain in unspecified joint: Secondary | ICD-10-CM | POA: Insufficient documentation

## 2021-12-12 DIAGNOSIS — M810 Age-related osteoporosis without current pathological fracture: Secondary | ICD-10-CM | POA: Insufficient documentation

## 2021-12-12 DIAGNOSIS — Z170 Estrogen receptor positive status [ER+]: Secondary | ICD-10-CM | POA: Insufficient documentation

## 2021-12-12 DIAGNOSIS — N951 Menopausal and female climacteric states: Secondary | ICD-10-CM | POA: Insufficient documentation

## 2021-12-12 DIAGNOSIS — Z86718 Personal history of other venous thrombosis and embolism: Secondary | ICD-10-CM | POA: Diagnosis not present

## 2021-12-12 DIAGNOSIS — D869 Sarcoidosis, unspecified: Secondary | ICD-10-CM | POA: Diagnosis not present

## 2021-12-21 ENCOUNTER — Other Ambulatory Visit: Payer: Self-pay | Admitting: Internal Medicine

## 2021-12-27 ENCOUNTER — Ambulatory Visit
Admission: RE | Admit: 2021-12-27 | Discharge: 2021-12-27 | Disposition: A | Discharge status: Home / Self Care | Payer: Federal, State, Local not specified - PPO | Source: Ambulatory Visit | Attending: Internal Medicine | Admitting: Internal Medicine

## 2021-12-27 DIAGNOSIS — R918 Other nonspecific abnormal finding of lung field: Secondary | ICD-10-CM

## 2021-12-30 ENCOUNTER — Telehealth: Payer: Self-pay | Admitting: Internal Medicine

## 2021-12-30 NOTE — Telephone Encounter (Signed)
Called patient back and left voicemail for patient to call office back in regards to ct results.

## 2021-12-30 NOTE — Telephone Encounter (Signed)
Patient called the answering service at 5:46pm returning a call to get her CT results.  Please advise.

## 2021-12-30 NOTE — Telephone Encounter (Signed)
Called and left voicemail for patient about her CT scan results. Told patient if she had questions regarding results to call office back. Nothing further needed

## 2021-12-30 NOTE — Telephone Encounter (Signed)
Spoke with patient regarding CT chest results. They verbalized understanding. No further questions.  Nothing further needed at this time.

## 2022-01-09 ENCOUNTER — Other Ambulatory Visit: Payer: Self-pay | Admitting: Internal Medicine

## 2022-01-13 ENCOUNTER — Telehealth: Payer: Self-pay | Admitting: Internal Medicine

## 2022-01-13 MED ORDER — ONETOUCH ULTRA VI STRP: ORAL_STRIP | 2 refills | Status: DC

## 2022-01-13 NOTE — Telephone Encounter (Signed)
Refill has been sent to the pt's pharmacy  

## 2022-01-13 NOTE — Telephone Encounter (Signed)
Caller & Relationship to patient: Kaleyah Labreck  Call back number: [phone]  Date of last office visit: 09/01/21  Date of next office visit:   Medication(s) to be refilled:  [facility] ULTRA test strip  Preferred Pharmacy:  [facility] STORE [place], [place] [facility] Phone:  [phone]  Fax:  [phone]

## 2022-02-13 ENCOUNTER — Other Ambulatory Visit: Payer: Self-pay | Admitting: Hematology

## 2022-03-08 ENCOUNTER — Other Ambulatory Visit: Payer: Self-pay | Admitting: Internal Medicine

## 2022-03-16 ENCOUNTER — Other Ambulatory Visit: Payer: Self-pay

## 2022-03-16 DIAGNOSIS — Z170 Estrogen receptor positive status [ER+]: Secondary | ICD-10-CM

## 2022-03-17 ENCOUNTER — Inpatient Hospital Stay: Payer: Federal, State, Local not specified - PPO | Attending: Hematology | Admitting: Hematology

## 2022-03-17 ENCOUNTER — Inpatient Hospital Stay: Payer: Federal, State, Local not specified - PPO

## 2022-03-17 ENCOUNTER — Encounter: Payer: Self-pay | Admitting: Hematology

## 2022-03-17 VITALS — BP 149/76 | HR 82 | Temp 98.5°F | Resp 16 | Ht 63.0 in | Wt 147.2 lb

## 2022-03-17 DIAGNOSIS — C50211 Malignant neoplasm of upper-inner quadrant of right female breast: Secondary | ICD-10-CM | POA: Insufficient documentation

## 2022-03-17 DIAGNOSIS — Z79811 Long term (current) use of aromatase inhibitors: Secondary | ICD-10-CM | POA: Insufficient documentation

## 2022-03-17 DIAGNOSIS — Z79899 Other long term (current) drug therapy: Secondary | ICD-10-CM | POA: Diagnosis not present

## 2022-03-17 DIAGNOSIS — Z923 Personal history of irradiation: Secondary | ICD-10-CM | POA: Diagnosis not present

## 2022-03-17 DIAGNOSIS — Z170 Estrogen receptor positive status [ER+]: Secondary | ICD-10-CM | POA: Diagnosis not present

## 2022-03-17 DIAGNOSIS — D869 Sarcoidosis, unspecified: Secondary | ICD-10-CM | POA: Diagnosis not present

## 2022-03-17 DIAGNOSIS — M2550 Pain in unspecified joint: Secondary | ICD-10-CM | POA: Diagnosis not present

## 2022-03-17 DIAGNOSIS — M810 Age-related osteoporosis without current pathological fracture: Secondary | ICD-10-CM | POA: Diagnosis not present

## 2022-03-17 LAB — CBC WITH DIFFERENTIAL (CANCER CENTER ONLY)
Abs Immature Granulocytes: 0.03 10*3/uL (ref 0.00–0.07)
Basophils Absolute: 0.1 10*3/uL (ref 0.0–0.1)
Basophils Relative: 1 %
Eosinophils Absolute: 0.3 10*3/uL (ref 0.0–0.5)
Eosinophils Relative: 5 %
HCT: 39.1 % (ref 36.0–46.0)
Hemoglobin: 13.1 g/dL (ref 12.0–15.0)
Immature Granulocytes: 1 %
Lymphocytes Relative: 17 %
Lymphs Abs: 0.9 10*3/uL (ref 0.7–4.0)
MCH: 29.8 pg (ref 26.0–34.0)
MCHC: 33.5 g/dL (ref 30.0–36.0)
MCV: 89.1 fL (ref 80.0–100.0)
Monocytes Absolute: 0.6 10*3/uL (ref 0.1–1.0)
Monocytes Relative: 11 %
Neutro Abs: 3.2 10*3/uL (ref 1.7–7.7)
Neutrophils Relative %: 65 %
Platelet Count: 286 10*3/uL (ref 150–400)
RBC: 4.39 MIL/uL (ref 3.87–5.11)
RDW: 13.3 % (ref 11.5–15.5)
WBC Count: 4.9 10*3/uL (ref 4.0–10.5)
nRBC: 0 % (ref 0.0–0.2)

## 2022-03-17 LAB — CMP (CANCER CENTER ONLY)
ALT: 18 U/L (ref 0–44)
AST: 19 U/L (ref 15–41)
Albumin: 4.2 g/dL (ref 3.5–5.0)
Alkaline Phosphatase: 107 U/L (ref 38–126)
Anion gap: 4 — ABNORMAL LOW (ref 5–15)
BUN: 20 mg/dL (ref 8–23)
CO2: 30 mmol/L (ref 22–32)
Calcium: 9.2 mg/dL (ref 8.9–10.3)
Chloride: 103 mmol/L (ref 98–111)
Creatinine: 0.84 mg/dL (ref 0.44–1.00)
GFR, Estimated: >60 mL/min (ref >60)
Glucose, Bld: 193 mg/dL — ABNORMAL HIGH (ref 70–99)
Potassium: 4.6 mmol/L (ref 3.5–5.1)
Sodium: 137 mmol/L (ref 135–145)
Total Bilirubin: 0.5 mg/dL (ref 0.3–1.2)
Total Protein: 7.2 g/dL (ref 6.5–8.1)

## 2022-03-17 MED ORDER — TAMOXIFEN CITRATE 20 MG PO TABS: 20.0000 mg | ORAL_TABLET | Freq: Every day | ORAL | 2 refills | Status: DC

## 2022-03-17 NOTE — Progress Notes (Signed)
Brooke [name]   [phone] Fax:(336) [phone]   Clinic Follow up Note   Patient Care Team: Hoyt Koch, MD as PCP - General (Internal Medicine) Stark Klein, MD as Consulting Physician (General Surgery) Truitt Merle, MD as Consulting Physician (Hematology) Kyung Rudd, MD as Consulting Physician (Radiation Oncology) Rockwell Germany, RN as Oncology Nurse Navigator Mauro Kaufmann, RN as Oncology Nurse Navigator Alla Feeling, NP as Nurse Practitioner (Nurse Practitioner)  Date of Service:  03/17/2022  CHIEF COMPLAINT: f/u of right breast cancer  CURRENT THERAPY:  Antiestrogen therapy, starting 09/2021, will switch to tamoxifen 03/2022  ASSESSMENT & PLAN:  Brooke [name] is a 76 y.o. post-menopausal female with   1. Malignant neoplasm of upper-inner quadrant of right breast, Stage IA, c(T1b, N0), ER+/PR+/HER2-, Grade 1 -found on screening mammogram. S/p right lumpectomy on 05/05/21 by Dr. Barry Dienes showed 1.4 cm IDC. Margins uninvolved. -s/p adjuvant radiation 2/9-08/24/21 under Dr. Lisbeth Renshaw. -she started anastrozole in 09/2021. She developed joint pains, requiring her to take extra strength tylenol BID. I recommend switching her to tamoxifen. She does have h/o a single instance of DVT ~20 years ago. She remains active, and I do not feel she is at high risk for recurrent DVT. She will continue ASA. I will call in for her today to start soon. -she is otherwise doing well. Labs reviewed, WNL except BG 193. Physical exam was unremarkable except for some muscular tenderness from lifting. There is no clinical concern for recurrence. -she will be due for mammogram early next month; order is in place. -phone visit in 3 months to see how she is tolerating tamoxifen. F/u in 6 months.   2. Bone Health  -DEXA on 09/22/21 showed osteoporosis, T-score of -3 at right femoral neck. -we discussed the role of zometa, and potential AEs. She will take to her dentist.    3.  Sarcoidosis, B/l Lung Base Nodularity  -nodularity seen on chest CT on 05/04/21 for interstitial lung disease. -bronchoscopy was performed on 07/04/21 by Dr. Carlis Abbott. Pathology from [facility] was negative. -nodularity is being followed by Dr. Annamaria Boots. Most recent CT chest on 12/27/21 was overall stable.     PLAN:  -she will stop anastrozole due to arthralgia. Will switch to tamoxifen, called in today  -mammogram due next month, order in place -phone visit in 3 months -lab and f/u in 6 months   No problem-specific Assessment & Plan notes found for this encounter.   SUMMARY OF ONCOLOGIC HISTORY: Oncology History Overview Note  Cancer Staging Malignant neoplasm of upper-inner quadrant of right breast in female, estrogen receptor positive ([place]) Staging form: Breast, AJCC 8th Edition - Clinical stage from 04/07/2021: Stage IA (cT1b, cN0, cM0, G1, ER+, PR+, HER2-) - Signed by Truitt Merle, MD on 04/19/2021    Malignant neoplasm of upper-inner quadrant of right breast in female, estrogen receptor positive ([place])  03/29/2021 Mammogram   Right Diagnostic MM and Right Breast US  There is a 0.6 cm x 0.9 cm x 0.5 cm irregular mass with a spiculated margin in the right breast at 2 o'clock posterior depth 11 cm from the nipple. This irregular mass is hypoechoic with posterior acoustic shadowing. No significant abnormalities were seen sonographically in the right axilla.   04/07/2021 Cancer Staging   Staging form: Breast, AJCC 8th Edition - Clinical stage from 04/07/2021: Stage IA (cT1b, cN0, cM0, G1, ER+, PR+, HER2-) - Signed by Truitt Merle, MD on 04/19/2021 Stage prefix: Initial diagnosis Histologic grading system: 3  grade system   04/07/2021 Pathology Results   Diagnosis Breast, right, needle core biopsy, 2:00 o'clock 11cmfn mass - INVASIVE DUCTAL CARCINOMA - FIBROADENOMATOID NODULE WITH CALCIFICATIONS - SEE COMMENT Microscopic Comment Based on the biopsy, the carcinoma appears Nottingham grade 1 of 3  and measures 0.4 cm in greatest linear extent.  PROGNOSTIC INDICATORS Results: The tumor cells are EQUIVOCAL for Her2 (2+). Her2 by FISH will be performed and results reported separately. Estrogen Receptor: 100%, POSITIVE, STRONG STAINING INTENSITY Progesterone Receptor: 50%, POSITIVE, MODERATE STAINING INTENSITY Proliferation Marker Ki67: 2%  FLUORESCENCE IN-SITU HYBRIDIZATION Results: GROUP 4: HER2 **NEGATIVE**   04/15/2021 Initial Diagnosis   Malignant neoplasm of upper-inner quadrant of right breast in female, estrogen receptor positive ([place])   05/05/2021 Cancer Staging   Staging form: Breast, AJCC 8th Edition - Pathologic stage from 05/05/2021: Stage Unknown (pT1c, pNX, cM0, G1, ER+, PR+, HER2-, Oncotype DX score: 23) - Signed by Truitt Merle, MD on 08/26/2021 Stage prefix: Initial diagnosis Multigene prognostic tests performed: Oncotype DX Recurrence score range: Greater than or equal to 11 Histologic grading system: 3 grade system Residual tumor (R): R0 - None   05/05/2021 Definitive Surgery   FINAL MICROSCOPIC DIAGNOSIS:   A. BREAST, RIGHT, LUMPECTOMY:  -  Invasive ductal carcinoma, Nottingham grade 1 of 3, 1.4 cm  -  Margins uninvolved by carcinoma (<0.1cm; inferior margin; see part B)  -  Previous biopsy site changes present  -  See oncology table below   B. BREAST, RIGHT INFERIOR MARGIN, EXCISION:  -  No residual carcinoma identified   C. BREAST, RIGHT MEDIAL MARGIN, EXCISION:  -  No residual carcinoma identified    07/28/2021 - 08/24/2021 Radiation Therapy   Site Technique Total Dose (Gy) Dose per Fx (Gy) Completed Fx Beam Energies  Breast, Right: Breast_R 3D 42.56/42.56 2.66 16/16 6X, 10X  Breast, Right: Breast_R_Bst 3D 10/10 2.5 4/4 6X, 10X     10/17/2021 -  Anti-estrogen oral therapy   Adjuvant anti-estrogen therapy with Anastrozole   12/12/2021 Survivorship   SCP delivered by Cira Rue, NP      INTERVAL HISTORY:  Brooke [name] is here for a follow up  of breast cancer. She was last seen by me on 09/15/21 with survivorship in the interim. She presents to the clinic alone. She reports she has joint pain from anastrozole, requiring her to take extra strength tylenol twice a day. She describes the pain as 6/10. She also reports some tenderness to her right breast but notes she exacerbated it with lifting.   All other systems were reviewed with the patient and are negative.  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    Pleural effusion    Sarcoidosis     SURGICAL HISTORY: Past Surgical History:  Procedure Laterality Date   ABDOMINAL HYSTERECTOMY     BREAST LUMPECTOMY WITH RADIOACTIVE SEED LOCALIZATION Right 05/05/2021   Procedure: RIGHT BREAST LUMPECTOMY WITH RADIOACTIVE SEED LOCALIZATION;  Surgeon: Stark Klein, MD;  Location: [place];  Service: General;  Laterality: Right;   BRONCHIAL BIOPSY  07/04/2021   Procedure: BRONCHIAL  BIOPSIES;  Surgeon: [name], DO;  Location: [facility];  Service: Cardiopulmonary;;   BRONCHIAL WASHINGS  07/04/2021   Procedure: BRONCHIAL WASHINGS;  Surgeon: [name], DO;  Location: [facility];  Service: Cardiopulmonary;;   CHOLECYSTECTOMY  06/19/1973   COLONOSCOPY     ESOPHAGOGASTRODUODENOSCOPY     ESOPHAGOGASTRODUODENOSCOPY N/A 08/09/2014   Procedure: ESOPHAGOGASTRODUODENOSCOPY (EGD);  Surgeon: Gatha Mayer, MD;  Location: [facility] END[facility]COPY;  Service: Endoscopy;  Laterality: N/A;   FOOT SURGERY  06/20/2011   left   HEMOSTASIS CONTROL  07/04/2021   Procedure: HEMOSTASIS CONTROL;   Surgeon: [name], DO;  Location: [facility];  Service: Cardiopulmonary;;   VIDEO BRONCHOSCOPY N/A 07/04/2021   Procedure: VIDEO BRONCHOSCOPY WITH FLUORO;  Surgeon: [name], DO;  Location: [facility];  Service: Cardiopulmonary;  Laterality: N/A;    I have reviewed the social history and family history with the patient and they are unchanged from previous note.  ALLERGIES:  is allergic to codeine.  MEDICATIONS:  Current Outpatient Medications  Medication Sig Dispense Refill   tamoxifen (NOLVADEX) 20 MG tablet Take 1 tablet (20 mg total) by mouth daily. 30 tablet 2   acetaminophen (TYLENOL) 500 MG tablet Take 500 mg by mouth every 6 (six) hours as needed for moderate pain.     albuterol (VENTOLIN HFA) 108 (90 Base) MCG/ACT inhaler INHALE 2 PUFFS BY MOUTH EVERY 6 HOURS AS NEEDED 25.5 g 11   ALPRAZolam (XANAX) 0.5 MG tablet TAKE 1/2 TABLET(0.25 MG) BY MOUTH DAILY AS NEEDED FOR ANXIETY 30 tablet 0   anastrozole (ARIMIDEX) 1 MG tablet TAKE 1 TABLET(1 MG) BY MOUTH DAILY 30 tablet 3   atorvastatin (LIPITOR) 40 MG tablet TAKE 1 TABLET(40 MG) BY MOUTH DAILY 90 tablet 1   BD PEN NEEDLE NANO 2ND GEN 32G X 4 MM MISC USE TO INJECT INSULIN AS DIRECTED 100 each 2   blood glucose meter kit and supplies Dispense based on patient and insurance preference. Use up to four times daily as directed. (FOR ICD-10 E10.9, E11.9). 1 each 0   carboxymethylcellulose (REFRESH PLUS) 0.5 % SOLN Place 1 drop into both eyes daily as needed (dry eyes).     escitalopram (LEXAPRO) 10 MG tablet Take 1 tablet (10 mg total) by mouth daily. Annual appt due in Sept must see provider for future refills 90 tablet 0   FARXIGA 10 MG TABS tablet TAKE 1 TABLET(10 MG) BY MOUTH DAILY 90 tablet 1   glimepiride (AMARYL) 2 MG tablet TAKE 1 TABLET BY MOUTH DAILY WITH BREAKFAST 90 tablet 0   glucose blood (ONETOUCH ULTRA) test strip TEST FOUR TIMES DAILY AS DIRECTED 100 strip 2   insulin degludec (TRESIBA FLEXTOUCH) 100 UNIT/ML  FlexTouch Pen Inject 20 Units into the skin daily. 18 mL 3   latanoprost (XALATAN) 0.005 % ophthalmic solution 1 drop in both eyes at bedtime  0   losartan (COZAAR) 100 MG tablet TAKE 1 TABLET BY MOUTH EVERY DAY 90 tablet 1   Multiple Vitamin (MULTIVITAMIN WITH MINERALS) TABS tablet Take 1 tablet by mouth daily.     No current facility-administered medications for this visit.    PHYSICAL EXAMINATION: ECOG PERFORMANCE STATUS: 1 - Symptomatic but completely ambulatory  Vitals:   03/17/22 1032  BP: (!) 149/76  Pulse: 82  Resp: 16  Temp: 98.5 F (36.9 C)  SpO2: 97%   Wt Readings from Last 3 Encounters:  03/17/22 147 lb 3.2 oz (66.8 kg)  12/12/21 151 lb 11.2 oz (68.8  kg)  09/15/21 148 lb 4.8 oz (67.3 kg)     GENERAL:alert, no distress and comfortable SKIN: skin color, texture, turgor are normal, no rashes or significant lesions EYES: normal, Conjunctiva are pink and non-injected, sclera clear  NECK: supple, thyroid normal size, non-tender, without nodularity LYMPH:  no palpable lymphadenopathy in the cervical, axillary LUNGS: clear to auscultation and percussion with normal breathing effort HEART: regular rate & rhythm and no murmurs and no lower extremity edema ABDOMEN:abdomen soft, non-tender and normal bowel sounds Musculoskeletal:no cyanosis of digits and no clubbing  NEURO: alert & oriented x 3 with fluent speech, no focal motor/sensory deficits BREAST: No palpable mass, nodules or adenopathy bilaterally. Breast exam benign.   LABORATORY DATA:  I have reviewed the data as listed    Latest Ref Rng & Units 03/17/2022   10:16 AM 04/20/2021    8:41 AM 02/22/2021   11:29 AM  CBC  WBC 4.0 - 10.5 K/uL 4.9  5.3  5.6   Hemoglobin 12.0 - 15.0 g/dL 13.1  13.4  14.2   Hematocrit 36.0 - 46.0 % 39.1  40.8  43.6   Platelets 150 - 400 K/uL 286  286  302.0         Latest Ref Rng & Units 03/17/2022   10:16 AM 04/20/2021    8:41 AM 02/22/2021   11:29 AM  CMP  Glucose 70 - 99 mg/dL 193   154  141   BUN 8 - 23 mg/dL _0 Creatinine 0.44 - 1.00 mg/dL 0.84  0.92  0.88   Sodium 135 - 145 mmol/L 137  139  135   Potassium 3.5 - 5.1 mmol/L 4.6  4.7  4.8   Chloride 98 - 111 mmol/L 103  104  99   CO2 22 - 32 mmol/L _1 Calcium 8.9 - 10.3 mg/dL 9.2  8.8  9.5   Total Protein 6.5 - 8.1 g/dL 7.2  7.0  7.4   Total Bilirubin 0.3 - 1.2 mg/dL 0.5  0.4  0.5   Alkaline Phos 38 - 126 U/L 107  103  92   AST 15 - 41 U/L _2 ALT 0 - 44 U/L _3 RADIOGRAPHIC STUDIES: I have personally reviewed the radiological images as listed and agreed with the findings in the report. No results found.    No orders of the defined types were placed in this encounter.  All questions were answered. The patient knows to call the clinic with any problems, questions or concerns. No barriers to learning was detected. The total time spent in the appointment was 30 minutes.     Truitt Merle, MD 03/17/2022   I, Wilburn Mylar, am acting as scribe for Truitt Merle, MD.   I have reviewed the above documentation for accuracy and completeness, and I agree with the above.

## 2022-03-20 ENCOUNTER — Other Ambulatory Visit: Payer: Self-pay | Admitting: Internal Medicine

## 2022-03-27 ENCOUNTER — Other Ambulatory Visit: Payer: Self-pay | Admitting: Internal Medicine

## 2022-04-10 ENCOUNTER — Other Ambulatory Visit: Payer: Self-pay | Admitting: Internal Medicine

## 2022-04-25 LAB — HM DIABETES EYE EXAM

## 2022-04-26 NOTE — Progress Notes (Signed)
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 History pleural effusion, complicated by DVT, allergic rhinitis, DM2 Office Spirometry 10/26/16-mild restriction of exhaled volume. 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 Complicated by  pleural effusion, DVT, allergic rhinitis, DM2, HTN,, Hyperlipidemia, R Breast Cancer, Anxiety/ Depression,  -albuterol hfa,  Covid vax-5 Phizer Flu vax-had Pending lumpectomy -----Patient has recently been diagnosed with breast cancer, feels like breathing is good. Breathing stable with little routine cough or wheeze. Chronic mild DOE- not worse.  We reviewed CXR. Shallow inspiration with prominent markings. Discussed relation to her previously identified "shrinking lung" pattern attributed in past to possible seronegative Sarcoid. We idscussed and will get HRCT for clear documentation before she starts Rx for breast cancer. Albuterol seems to help dyspnea some.  PFT 02/20/20- mild DLCO deficit CXR 02/20/20-  IMPRESSION: 1. Diminishing pulmonary insufflation. 2. Stable bibasilar and left apical scarring   . 04/27/22- 76 year old female former smoker previously followed for atypical/seronegative Sarcoid with bilateral lower zone lung volume loss, peripheral neuropathy. Complicated by  pleural effusion, DVT, allergic rhinitis, DM2, HTN,, Hyperlipidemia,  R Breast Cancer, Anxiety/ Depression, Glaucoma, -albuterol hfa,  Covid vax-5 Phizer Flu vax-had ------Pt states since getting flu shot she has been feeling "bad" and experiencing SOB Describes malaise since flu vax.  "Grabbing" episodes of shortness of breath, easy fatigue, occ dry cough, marginal help from rescue inhaler. No fever, chills, bleeding, adenopathy. Discussed CT? Pneumonitis RUL. She has finished XRT for breast but continues tamoxifen. HRCT chest 12/1221- MPRESSION: 1. Bibasilar pleuroparenchymal chronic appearing volume loss with traction bronchiectasis/bronchiolectasis, overall similar to 08/29/2021, with decreased nodular components in the right lower lobe and lingula. Findings may be due to sarcoid or organizing pneumonia. Findings are suggestive of an alternative diagnosis (not UIP) per consensus guidelines: Diagnosis of Idiopathic Pulmonary Fibrosis: An Official ATS/ERS/JRS/ALAT Clinical Practice Guideline. [place], Iss 5, [phone], Feb 17 2017. 2. New peripheral peribronchovascular ground-glass in the right upper lobe, likely infectious/inflammatory etiology. 3. Air trapping is indicative of small airways disease. 4. Liver margin may be minimally irregular, raising suspicion for cirrhosis. 5. Aortic atherosclerosis (ICD10-I70.0). Coronary artery calcification.   ROS-see HPI   + = positive Constitutional:   No-   weight loss, +night sweats, fevers, chills, fatigue, lassitude. HEENT:   No-  headaches, difficulty swallowing, tooth/dental problems, sore throat,       No-  sneezing, itching, ear ache, +nasal congestion, post nasal drip,  CV:  No-   chest pain, orthopnea, PND, swelling in lower extremities, anasarca,                                                                              dizziness, palpitations Resp: +shortness of breath with exertion or at rest.              No-   productive cough,  No non-productive cough,  No- coughing up of blood.  No-   change in color of mucus.  No- wheezing.   Skin: No-   rash or lesions. GI:  No-   heartburn, indigestion, abdominal pain, nausea, vomiting,  GU: . MS:  No-   joint pain or swelling.   . Neuro-     nothing unusual Psych:  No- change in mood or affect. No depression or anxiety.  No memory loss.  OBJ- Physical Exam      General- Alert, Oriented, Affect-appropriate, Distress- none acute. +Looks well. Skin- rash-none, lesions- none, excoriation- none Lymphadenopathy- none Head- atraumatic            Eyes- Gross vision intact, PERRLA, conjunctivae and secretions clear            Ears- Hearing, canals-normal            Nose- Clear, no-Septal dev, mucus, polyps, erosion, perforation             Throat- Mallampati II , mucosa clear , drainage- none, tonsils- atrophic Neck- flexible , trachea midline, no stridor , thyroid nl, carotid no bruit Chest - symmetrical excursion , unlabored           Heart/CV- RRR , no murmur , no gallop  , no rub, nl s1 s2                           - JVD- none , edema- none, stasis changes- none, varices- none           Lung-, Clear/+ decreased in bases, wheeze- none, cough+light ,   dullness-none, rub- none            Chest wall-  Abd-  Br/ Gen/ Rectal- Not done, not indicated Extrem- cyanosis- none, clubbing, none, atrophy- none, strength- nl Neuro- grossly intact to observation

## 2022-04-27 ENCOUNTER — Ambulatory Visit (INDEPENDENT_AMBULATORY_CARE_PROVIDER_SITE_OTHER): Payer: Federal, State, Local not specified - PPO | Admitting: Internal Medicine

## 2022-04-27 ENCOUNTER — Encounter: Payer: Self-pay | Admitting: Internal Medicine

## 2022-04-27 VITALS — BP 132/68 | HR 78 | Ht 63.0 in | Wt 147.2 lb

## 2022-04-27 DIAGNOSIS — J209 Acute bronchitis, unspecified: Secondary | ICD-10-CM

## 2022-04-27 DIAGNOSIS — D869 Sarcoidosis, unspecified: Secondary | ICD-10-CM | POA: Diagnosis not present

## 2022-04-27 LAB — C-REACTIVE PROTEIN: CRP: 2.9 mg/dL (ref 0.5–20.0)

## 2022-04-27 LAB — SEDIMENTATION RATE: Sed Rate: 31 mm/hr — ABNORMAL HIGH (ref 0–30)

## 2022-04-27 MED ORDER — TRELEGY ELLIPTA 200-62.5-25 MCG/ACT IN AEPB: 1.0000 | INHALATION_SPRAY | Freq: Every day | RESPIRATORY_TRACT | 0 refills | Status: DC

## 2022-04-27 NOTE — Patient Instructions (Signed)
Order- lab- CRP, Angiotensin converting enzyme level, Sedimentation rate           Dx sarcoid  Order- sample Trelegy 200    inhale 1 puf, then rinse mouth, one time daily  You can stil; use your albuterol rescue inhaler as before, when needed

## 2022-04-28 LAB — ANGIOTENSIN CONVERTING ENZYME: Angiotensin-Converting Enzyme: 26 U/L (ref 9–67)

## 2022-05-01 ENCOUNTER — Encounter: Payer: Self-pay | Admitting: Internal Medicine

## 2022-05-01 NOTE — Assessment & Plan Note (Signed)
Suggestive of viral illness. Less likely due to flu vax. Plan - conservative- fluids, continue routine meds. Labs to check for signs of inflammation.

## 2022-05-01 NOTE — Assessment & Plan Note (Signed)
Chronic restrictive pattern with basilar scarring, suspected in past to be burned-out sarcoid.

## 2022-05-08 ENCOUNTER — Other Ambulatory Visit: Payer: Self-pay | Admitting: Internal Medicine

## 2022-05-10 LAB — HM MAMMOGRAPHY

## 2022-05-16 ENCOUNTER — Encounter: Payer: Self-pay | Admitting: Hematology

## 2022-05-30 ENCOUNTER — Other Ambulatory Visit: Payer: Self-pay | Admitting: Internal Medicine

## 2022-06-20 ENCOUNTER — Telehealth: Payer: Self-pay | Admitting: Hematology

## 2022-06-20 ENCOUNTER — Other Ambulatory Visit: Payer: Self-pay | Admitting: Hematology

## 2022-06-20 ENCOUNTER — Ambulatory Visit (HOSPITAL_BASED_OUTPATIENT_CLINIC_OR_DEPARTMENT_OTHER): Payer: Self-pay | Admitting: Hematology

## 2022-06-20 DIAGNOSIS — Z170 Estrogen receptor positive status [ER+]: Secondary | ICD-10-CM

## 2022-06-20 DIAGNOSIS — C50211 Malignant neoplasm of upper-inner quadrant of right female breast: Secondary | ICD-10-CM

## 2022-06-20 NOTE — Progress Notes (Signed)
Called twice for a virtual visit and did not get hold of her. I left her a message to call back and reschedule.  Brooke [name]  06/20/2022

## 2022-06-20 NOTE — Telephone Encounter (Signed)
Left patient a voicemail regarding appointment change  

## 2022-06-21 ENCOUNTER — Telehealth: Payer: Federal, State, Local not specified - PPO | Admitting: Hematology

## 2022-06-22 ENCOUNTER — Telehealth: Payer: Federal, State, Local not specified - PPO | Admitting: Hematology

## 2022-06-28 ENCOUNTER — Other Ambulatory Visit: Payer: Self-pay | Admitting: Internal Medicine

## 2022-06-30 ENCOUNTER — Other Ambulatory Visit: Payer: Self-pay | Admitting: Internal Medicine

## 2022-07-06 ENCOUNTER — Other Ambulatory Visit: Payer: Self-pay | Admitting: Internal Medicine

## 2022-07-17 ENCOUNTER — Other Ambulatory Visit: Payer: Self-pay | Admitting: Internal Medicine

## 2022-07-17 NOTE — Telephone Encounter (Signed)
Ok to pcp please °

## 2022-08-04 ENCOUNTER — Encounter: Payer: Self-pay | Admitting: Internal Medicine

## 2022-08-04 ENCOUNTER — Other Ambulatory Visit: Payer: Self-pay | Admitting: Internal Medicine

## 2022-08-11 ENCOUNTER — Other Ambulatory Visit: Payer: Self-pay | Admitting: Internal Medicine

## 2022-08-16 ENCOUNTER — Telehealth: Payer: Self-pay | Admitting: Hematology

## 2022-08-16 NOTE — Telephone Encounter (Signed)
Per Dr Burr Medico being out of office reached out to reschedule patient, patient aware of date and time of appointment.

## 2022-08-18 ENCOUNTER — Telehealth: Payer: Self-pay | Admitting: Internal Medicine

## 2022-08-18 MED ORDER — ALBUTEROL SULFATE HFA 108 (90 BASE) MCG/ACT IN AERS: 2.0000 | INHALATION_SPRAY | Freq: Four times a day (QID) | RESPIRATORY_TRACT | 11 refills | Status: DC | PRN

## 2022-08-18 NOTE — Telephone Encounter (Signed)
Spoke with patient. Advised refill for ventolin was sent to pharmacy on 08/11/22. Nothing further needed.

## 2022-08-18 NOTE — Telephone Encounter (Signed)
Patient states [facility] does not have the RX for Ventolin inhaler. Pharmacy is [facility]. Patient phone number is [phone].

## 2022-08-18 NOTE — Telephone Encounter (Signed)
Called and spoke with patient and went over what medication she was needing refilled. Verified pharmacy. Medication sent in

## 2022-08-18 NOTE — Telephone Encounter (Signed)
Patient called to request a new script for her Albuterol inhaler be sent to [facility] on [address].  Please advise.  CB# [phone]

## 2022-09-05 ENCOUNTER — Other Ambulatory Visit: Payer: Self-pay | Admitting: Internal Medicine

## 2022-09-08 ENCOUNTER — Telehealth: Payer: Self-pay | Admitting: *Deleted

## 2022-09-08 ENCOUNTER — Other Ambulatory Visit: Payer: Self-pay

## 2022-09-08 ENCOUNTER — Telehealth: Payer: Self-pay

## 2022-09-08 NOTE — Telephone Encounter (Signed)
Pt called with frustration around her f/u being r/s "4 times and some of them I wasn't notified of, they were just moved" Validated pt feelings, provided emotional support. Pt request to switch provider. Informed pt Dr. Lindi Adie has availability on 4/8. Pt agree to this date and relate either of times available with work with her schedule.  Pt appreciative of active listening and response to frustration. Encourage pt to call with needs. Received verbal understanding.

## 2022-09-08 NOTE — Telephone Encounter (Signed)
Pt called upset that her appt that was scheduled on 09/13/2022 was changed again to a later date.  Pt stated she's been reschedule 4 times already and feels that it's disrespectful to keep rescheduling her appts.  Informed pt that Dr. Burr Medico is out of the office the week of 09/13/2022 which is probably why her appt was rescheduled.  Informed pt that if she needs to be seen on that particular day, this RN can get her scheduled with one of Dr. Ernestina Penna APPs.  Pt stated she does not want to see the APPs but would rather see Dr. Burr Medico.  Pt stated she will keep the new appt that is scheduled on 09/22/2022 but will be switching her care to another provider.  Notified Dr. Burr Medico of the pt's call and concerns.

## 2022-09-13 ENCOUNTER — Ambulatory Visit: Payer: Federal, State, Local not specified - PPO | Admitting: Hematology

## 2022-09-13 ENCOUNTER — Ambulatory Visit: Payer: Federal, State, Local not specified - PPO | Admitting: Internal Medicine

## 2022-09-13 ENCOUNTER — Other Ambulatory Visit: Payer: Federal, State, Local not specified - PPO

## 2022-09-13 ENCOUNTER — Telehealth: Payer: Self-pay | Admitting: Internal Medicine

## 2022-09-13 NOTE — Telephone Encounter (Signed)
Prescription Request  09/13/2022  LOV: Visit date not found  What is the name of the medication or equipment? FARXIGA 10 MG TABS tablet   Have you contacted your pharmacy to request a refill? No   Which pharmacy would you like this sent to?  [facility] [address], [address] [place] [address] Phone: [phone] Fax: [phone]     Patient notified that their request is being sent to the clinical staff for review and that they should receive a response within 2 business days.   Please advise at Mobile [phone] (mobile)   Next OV is 09/20/22.

## 2022-09-14 ENCOUNTER — Other Ambulatory Visit: Payer: Self-pay | Admitting: *Deleted

## 2022-09-14 MED ORDER — DAPAGLIFLOZIN PROPANEDIOL 10 MG PO TABS: ORAL_TABLET | ORAL | 1 refills | Status: DC

## 2022-09-14 NOTE — Telephone Encounter (Signed)
New prescription sent to patient pharmacy on file 

## 2022-09-15 ENCOUNTER — Telehealth: Payer: Self-pay | Admitting: Hematology and Oncology

## 2022-09-15 NOTE — Telephone Encounter (Signed)
Scheduled appointment per scheduling message. Patient is aware of the made appointment. 

## 2022-09-18 ENCOUNTER — Other Ambulatory Visit: Payer: Self-pay | Admitting: Internal Medicine

## 2022-09-20 ENCOUNTER — Ambulatory Visit: Payer: Federal, State, Local not specified - PPO | Admitting: Internal Medicine

## 2022-09-20 ENCOUNTER — Ambulatory Visit: Payer: Federal, State, Local not specified - PPO | Admitting: Hematology

## 2022-09-20 ENCOUNTER — Other Ambulatory Visit: Payer: Self-pay | Admitting: Internal Medicine

## 2022-09-20 ENCOUNTER — Other Ambulatory Visit: Payer: Federal, State, Local not specified - PPO

## 2022-09-20 ENCOUNTER — Encounter: Payer: Self-pay | Admitting: Internal Medicine

## 2022-09-20 VITALS — BP 160/82 | HR 67 | Temp 97.8°F | Ht 63.0 in | Wt 146.1 lb

## 2022-09-20 DIAGNOSIS — E1169 Type 2 diabetes mellitus with other specified complication: Secondary | ICD-10-CM | POA: Diagnosis not present

## 2022-09-20 DIAGNOSIS — E785 Hyperlipidemia, unspecified: Secondary | ICD-10-CM | POA: Diagnosis not present

## 2022-09-20 DIAGNOSIS — E1139 Type 2 diabetes mellitus with other diabetic ophthalmic complication: Secondary | ICD-10-CM | POA: Diagnosis not present

## 2022-09-20 DIAGNOSIS — Z0000 Encounter for general adult medical examination without abnormal findings: Secondary | ICD-10-CM

## 2022-09-20 DIAGNOSIS — I10 Essential (primary) hypertension: Secondary | ICD-10-CM

## 2022-09-20 DIAGNOSIS — F331 Major depressive disorder, recurrent, moderate: Secondary | ICD-10-CM

## 2022-09-20 DIAGNOSIS — Z170 Estrogen receptor positive status [ER+]: Secondary | ICD-10-CM

## 2022-09-20 DIAGNOSIS — C50211 Malignant neoplasm of upper-inner quadrant of right female breast: Secondary | ICD-10-CM

## 2022-09-20 DIAGNOSIS — H42 Glaucoma in diseases classified elsewhere: Secondary | ICD-10-CM

## 2022-09-20 LAB — CBC
HCT: 41.2 % (ref 36.0–46.0)
Hemoglobin: 13.7 g/dL (ref 12.0–15.0)
MCHC: 33.3 g/dL (ref 30.0–36.0)
MCV: 85.6 fl (ref 78.0–100.0)
Platelets: 259 10*3/uL (ref 150.0–400.0)
RBC: 4.81 Mil/uL (ref 3.87–5.11)
RDW: 15.7 % — ABNORMAL HIGH (ref 11.5–15.5)
WBC: 4.8 10*3/uL (ref 4.0–10.5)

## 2022-09-20 LAB — COMPREHENSIVE METABOLIC PANEL
ALT: 12 U/L (ref 0–35)
AST: 16 U/L (ref 0–37)
Albumin: 4.1 g/dL (ref 3.5–5.2)
Alkaline Phosphatase: 82 U/L (ref 39–117)
BUN: 17 mg/dL (ref 6–23)
CO2: 30 mEq/L (ref 19–32)
Calcium: 9 mg/dL (ref 8.4–10.5)
Chloride: 101 mEq/L (ref 96–112)
Creatinine, Ser: 0.99 mg/dL (ref 0.40–1.20)
GFR: 54.39 mL/min — ABNORMAL LOW (ref >60.00)
Glucose, Bld: 131 mg/dL — ABNORMAL HIGH (ref 70–99)
Potassium: 4.5 mEq/L (ref 3.5–5.1)
Sodium: 138 mEq/L (ref 135–145)
Total Bilirubin: 0.4 mg/dL (ref 0.2–1.2)
Total Protein: 6.9 g/dL (ref 6.0–8.3)

## 2022-09-20 LAB — LIPID PANEL
Cholesterol: 158 mg/dL (ref 0–200)
HDL: 51.3 mg/dL (ref >39.00)
LDL Cholesterol: 87 mg/dL (ref 0–99)
NonHDL: 106.78
Total CHOL/HDL Ratio: 3
Triglycerides: 98 mg/dL (ref 0.0–149.0)
VLDL: 19.6 mg/dL (ref 0.0–40.0)

## 2022-09-20 LAB — MICROALBUMIN / CREATININE URINE RATIO
Creatinine,U: 91.8 mg/dL
Microalb Creat Ratio: 0.8 mg/g (ref 0.0–30.0)
Microalb, Ur: 0.7 mg/dL (ref 0.0–1.9)

## 2022-09-20 LAB — HEMOGLOBIN A1C: Hgb A1c MFr Bld: 10 % — ABNORMAL HIGH (ref 4.6–6.5)

## 2022-09-20 NOTE — Assessment & Plan Note (Signed)
Flu shot yearly. Covid-19 counseled. Pneumonia complete. Shingrix due at pharmacy. Tetanus due at pharmacy. Colonoscopy aged out. Mammogram aged out, pap smear aged out and dexa complete. Counseled about sun safety and mole surveillance. Counseled about the dangers of distracted driving. Given 10 year screening recommendations.

## 2022-09-20 NOTE — Progress Notes (Signed)
   Subjective:   Patient ID: Brooke [name], female    DOB: 19-Dec-1945, 77 y.o.   MRN: [id number]  HPI The patient is here for physical.  PMH, [facility], social history reviewed and updated  Review of Systems  Constitutional: Negative.   HENT: Negative.    Eyes: Negative.   Respiratory:  Negative for cough, chest tightness and shortness of breath.   Cardiovascular:  Negative for chest pain, palpitations and leg swelling.  Gastrointestinal:  Negative for abdominal distention, abdominal pain, constipation, diarrhea, nausea and vomiting.  Musculoskeletal: Negative.   Skin: Negative.   Neurological: Negative.   Psychiatric/Behavioral: Negative.      Objective:  Physical Exam Constitutional:      Appearance: She is well-developed.  HENT:     Head: Normocephalic and atraumatic.  Cardiovascular:     Rate and Rhythm: Normal rate and regular rhythm.  Pulmonary:     Effort: Pulmonary effort is normal. No respiratory distress.     Breath sounds: Normal breath sounds. No wheezing or rales.  Abdominal:     General: Bowel sounds are normal. There is no distension.     Palpations: Abdomen is soft.     Tenderness: There is no abdominal tenderness. There is no rebound.  Musculoskeletal:     Cervical back: Normal range of motion.  Skin:    General: Skin is warm and dry.     Comments: Foot exam  Neurological:     Mental Status: She is alert and oriented to person, place, and time.     Coordination: Coordination normal.     Vitals:   09/20/22 0915 09/20/22 0918  BP: (!) 160/82 (!) 160/82  Pulse: 67   Temp: 97.8 F (36.6 C)   TempSrc: Oral   SpO2: 98%   Weight: 146 lb 2 oz (66.3 kg)   Height: 5\' 3"  (1.6 m)     Assessment & Plan:

## 2022-09-20 NOTE — Assessment & Plan Note (Signed)
Taking tamoxifen and less body aches. Continue and sees oncology yearly.

## 2022-09-20 NOTE — Assessment & Plan Note (Signed)
BP is moderately high today she has been out of meds for 4-5 days. Previously at goal on losartan 100 mg daily. This was refilled a few days ago she was advised to fill. Checking CMP and adjust as needed.

## 2022-09-20 NOTE — Patient Instructions (Signed)
We will check everything today.

## 2022-09-20 NOTE — Assessment & Plan Note (Signed)
Foot exam done, checking HgA1c microalbumin to creatinine ratio and CMP and lipid panel. Adjust as needed she is taking tresiba 20 units daily, amaryl 2 mg daily and farxiga 10 mg daily. Is on statin and ARB.

## 2022-09-20 NOTE — Assessment & Plan Note (Addendum)
This is moderate and in remission. Taking lexapro 10 mg daily and rare alprazolam. Satisfied with control and we will continue at current dosing.

## 2022-09-20 NOTE — Assessment & Plan Note (Signed)
Checking lipid panel and adjust lipitor 40 mg daily as needed for LDL goal <100.  ?

## 2022-09-23 NOTE — Progress Notes (Signed)
Patient Care Team: Myrlene Broker, MD as PCP - General (Internal Medicine) Almond Lint, MD as Consulting Physician (General Surgery) Malachy Mood, MD as Consulting Physician (Hematology) Dorothy Puffer, MD as Consulting Physician (Radiation Oncology) Donnelly Angelica, RN as Oncology Nurse Navigator Pershing Proud, RN as Oncology Nurse Navigator Pollyann Samples, NP as Nurse Practitioner (Nurse Practitioner)  DIAGNOSIS: No diagnosis found.  SUMMARY OF ONCOLOGIC HISTORY: Oncology History Overview Note  Cancer Staging Malignant neoplasm of upper-inner quadrant of right breast in female, estrogen receptor positive (HCC) Staging form: Breast, AJCC 8th Edition - Clinical stage from 04/07/2021: Stage IA (cT1b, cN0, cM0, G1, ER+, PR+, HER2-) - Signed by Malachy Mood, MD on 04/19/2021    Malignant neoplasm of upper-inner quadrant of right breast in female, estrogen receptor positive  03/29/2021 Mammogram   Right Diagnostic MM and Right Breast US  There is a 0.6 cm x 0.9 cm x 0.5 cm irregular mass with a spiculated margin in the right breast at 2 o'clock posterior depth 11 cm from the nipple. This irregular mass is hypoechoic with posterior acoustic shadowing. No significant abnormalities were seen sonographically in the right axilla.   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  PROGNOSTIC INDICATORS Results: The tumor cells are EQUIVOCAL for Her2  (2+). Her2 by FISH will be performed and results reported separately. Estrogen Receptor: 100%, POSITIVE, STRONG STAINING INTENSITY Progesterone Receptor: 50%, POSITIVE, MODERATE STAINING INTENSITY Proliferation Marker Ki67: 2%  FLUORESCENCE IN-SITU HYBRIDIZATION Results: GROUP 4: HER2 **NEGATIVE**   04/15/2021 Initial Diagnosis   Malignant neoplasm of upper-inner quadrant of right breast in female, estrogen receptor positive (HCC)   05/05/2021 Cancer Staging   Staging form: Breast, AJCC 8th Edition - Pathologic stage from 05/05/2021: Stage Unknown (pT1c, pNX, cM0, G1, ER+, PR+, HER2-, Oncotype DX score: 23) - Signed by Malachy Mood, MD on 08/26/2021 Stage prefix: Initial diagnosis Multigene prognostic tests performed: Oncotype DX Recurrence score range: Greater than or equal to 11 Histologic grading system: 3 grade system Residual tumor (R): R0 - None   05/05/2021 Definitive Surgery   FINAL MICROSCOPIC DIAGNOSIS:   A. BREAST, RIGHT, LUMPECTOMY:  -  Invasive ductal carcinoma, Nottingham grade 1 of 3, 1.4 cm  -  Margins uninvolved by carcinoma (<0.1cm; inferior margin; see part B)  -  Previous biopsy site changes present  -  See oncology table below   B. BREAST, RIGHT INFERIOR MARGIN, EXCISION:  -  No residual carcinoma identified   C. BREAST, RIGHT MEDIAL MARGIN, EXCISION:  -  No residual carcinoma identified    07/28/2021 - 08/24/2021 Radiation Therapy   Site Technique Total Dose (Gy) Dose per Fx (Gy) Completed Fx Beam Energies  Breast, Right: Breast_R 3D 42.56/42.56 2.66 16/16 6X, 10X  Breast, Right: Breast_R_Bst 3D 10/10 2.5 4/4 6X, 10X     10/17/2021 -  Anti-estrogen oral therapy   Adjuvant anti-estrogen therapy with Anastrozole   12/12/2021 Survivorship   SCP delivered by Santiago Glad, NP  CHIEF COMPLIANT: Follow-up on Tamoxifen/ Establish oncology care with Dr. Pamelia HoitGudena   INTERVAL HISTORY: Brooke [name] is a 77 y.o with right breast cancer. Currently on tamoxifen. She  presents to the clinic for a follow-up.    ALLERGIES:  is allergic to codeine.  MEDICATIONS:  Current Outpatient Medications  Medication Sig Dispense Refill   albuterol (VENTOLIN HFA) 108 (90 Base) MCG/ACT inhaler Inhale 2 puffs into the lungs every 6 (six) hours as needed. 25.5 g 11   ALPRAZolam (XANAX) 0.5 MG tablet TAKE 1/2 TABLET(0.25 MG) BY MOUTH DAILY AS NEEDED FOR ANXIETY 30 tablet 1   atorvastatin (LIPITOR) 40 MG tablet TAKE 1 TABLET(40 MG) BY MOUTH DAILY 90 tablet 1   BD PEN NEEDLE NANO 2ND GEN 32G X 4 MM MISC USE TO INJECT INSULIN AS DIRECTED 100 each 2   blood glucose meter kit and supplies Dispense based on patient and insurance preference. Use up to four times daily as directed. (FOR ICD-10 E10.9, E11.9). 1 each 0   carboxymethylcellulose (REFRESH PLUS) 0.5 % SOLN Place 1 drop into both eyes daily as needed (dry eyes).     dapagliflozin propanediol (FARXIGA) 10 MG TABS tablet TAKE 1 TABLET(10 MG) BY MOUTH DAILY 90 tablet 1   escitalopram (LEXAPRO) 10 MG tablet TAKE 1 TABLET BY MOUTH EVERY DAY. DUE FOR ANNUAL APPOINTMENT IN [place]. NEED APPOINTMENT FOR FURTHER REFILLS 90 tablet 3   Fluticasone-Umeclidin-Vilant (TRELEGY ELLIPTA) 200-62.5-25 MCG/ACT AEPB Inhale 1 puff into the lungs daily. 1 each 0   glimepiride (AMARYL) 2 MG tablet TAKE 1 TABLET BY MOUTH DAILY WITH BREAKFAST 90 tablet 0   glucose blood (ONETOUCH ULTRA) test strip TEST FOUR TIMES DAILY AS DIRECTED 100 strip 2   latanoprost (XALATAN) 0.005 % ophthalmic solution 1 drop in both eyes at bedtime  0   losartan (COZAAR) 100 MG tablet TAKE 1 TABLET BY MOUTH EVERY DAY 90 tablet 1   Multiple Vitamin (MULTIVITAMIN WITH MINERALS) TABS tablet Take 1 tablet by mouth daily.     tamoxifen (NOLVADEX) 20 MG tablet TAKE 1 TABLET(20 MG) BY MOUTH DAILY 30 tablet 2   TRESIBA FLEXTOUCH 100 UNIT/ML FlexTouch Pen ADMINISTER 20 UNITS UNDER THE SKIN DAILY 18 mL 0   No current facility-administered medications for this visit.     PHYSICAL EXAMINATION: ECOG PERFORMANCE STATUS: {CHL ONC ECOG [id number]}  There were no vitals filed for this visit. There were no vitals filed for this visit.  BREAST:*** No palpable masses or nodules in either right or left breasts. No palpable axillary supraclavicular or infraclavicular adenopathy no breast tenderness or nipple discharge. (exam performed in the presence of a chaperone)  LABORATORY DATA:  I have reviewed the data as listed    Latest Ref Rng & Units 09/20/2022    9:48 AM 03/17/2022   10:16 AM 04/20/2021    8:41 AM  CMP  Glucose 70 - 99 mg/dL 161131  [id number]  [id number]   BUN 6 - 23 mg/dL 17  20  15    Creatinine 0.40 - 1.20 mg/dL 4.090.99  8.110.84  9.140.92   Sodium 135 - 145 mEq/L 138  137  139   Potassium 3.5 - 5.1 mEq/L 4.5  4.6  4.7   Chloride 96 - 112 mEq/L 101  103  104   CO2 19 - 32 mEq/L 30  30  26    Calcium 8.4 - 10.5 mg/dL 9.0  9.2  8.8   Total Protein 6.0 - 8.3 g/dL 6.9  7.2  7.0  Total Bilirubin 0.2 - 1.2 mg/dL 0.4  0.5  0.4   Alkaline Phos 39 - 117 U/L 82  107  103   AST 0 - 37 U/L 16  19  15    ALT 0 - 35 U/L 12  18  14      Lab Results  Component Value Date   WBC 4.8 09/20/2022   HGB 13.7 09/20/2022   HCT 41.2 09/20/2022   MCV 85.6 09/20/2022   PLT 259.0 09/20/2022   NEUTROABS 3.2 03/17/2022    ASSESSMENT & PLAN:  No problem-specific Assessment & Plan notes found for this encounter.    No orders of the defined types were placed in this encounter.  The patient has a good understanding of the overall plan. she agrees with it. she will call with any problems that may develop before the next visit here. Total time spent: 30 mins including face to face time and time spent for planning, charting and co-ordination of care   Sherlyn Lick, CMA 09/23/22    I Janan Ridge am acting as a Neurosurgeon for [employer]  ***

## 2022-09-25 ENCOUNTER — Inpatient Hospital Stay: Payer: Federal, State, Local not specified - PPO | Admitting: Hematology

## 2022-09-25 ENCOUNTER — Inpatient Hospital Stay: Payer: Federal, State, Local not specified - PPO

## 2022-09-25 ENCOUNTER — Inpatient Hospital Stay
Payer: Federal, State, Local not specified - PPO | Attending: Hematology and Oncology | Admitting: Hematology and Oncology

## 2022-09-25 VITALS — BP 155/80 | HR 67 | Temp 97.2°F | Resp 18 | Ht 63.0 in | Wt 145.9 lb

## 2022-09-25 DIAGNOSIS — E119 Type 2 diabetes mellitus without complications: Secondary | ICD-10-CM | POA: Insufficient documentation

## 2022-09-25 DIAGNOSIS — C50211 Malignant neoplasm of upper-inner quadrant of right female breast: Secondary | ICD-10-CM | POA: Diagnosis not present

## 2022-09-25 DIAGNOSIS — Z79899 Other long term (current) drug therapy: Secondary | ICD-10-CM | POA: Insufficient documentation

## 2022-09-25 DIAGNOSIS — Z170 Estrogen receptor positive status [ER+]: Secondary | ICD-10-CM

## 2022-09-25 DIAGNOSIS — M810 Age-related osteoporosis without current pathological fracture: Secondary | ICD-10-CM

## 2022-09-25 NOTE — Assessment & Plan Note (Addendum)
05/05/2021: Right lumpectomy: Dr. Donell Beers: Grade 1 IDC 1.4 cm, margins negative, ER 100%, PR 50%, Ki67 2%, HER2 2+ by IHC, FISH negative, Oncotype DX score 23 07/27/2021: Completed radiation (Patient switched providers from Dr.Feng to myself)  Current treatment: Anastrozole started 10/17/2021, switched to tamoxifen November 2023 due to joint pain and stiffness  Tamoxifen toxicities: Denies any arthralgias or myalgias. Unfortunately patient's diabetes is not under control with A1c of 10. 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 I discussed with her that she needs to get her diabetes under control so that her coronary artery disease can be lowered and only then she can receive tamoxifen. Therefore I discontinue tamoxifen at this time and we will reassess her in 6 months to see if she gets better with her glucose control and then we can think about starting back on the tamoxifen. I discussed with her about Greggory Keen as another option to improve her glycemic control and also the importance of watching her diet (especially icecreams).  Breast cancer surveillance: Mammogram 05/10/2022: Benign breast density category B  Return to clinic in 6 months for follow-up to see if he can resume tamoxifen

## 2022-09-26 ENCOUNTER — Other Ambulatory Visit: Payer: Self-pay | Admitting: Internal Medicine

## 2022-09-26 MED ORDER — GLIMEPIRIDE 4 MG PO TABS: 4.0000 mg | ORAL_TABLET | Freq: Every day | ORAL | 1 refills | Status: DC

## 2022-09-26 MED ORDER — ATORVASTATIN CALCIUM 80 MG PO TABS: 80.0000 mg | ORAL_TABLET | Freq: Every day | ORAL | 3 refills | Status: DC

## 2022-10-09 ENCOUNTER — Inpatient Hospital Stay: Payer: Federal, State, Local not specified - PPO

## 2022-10-09 VITALS — BP 192/87 | HR 65 | Temp 100.0°F | Resp 18

## 2022-10-09 DIAGNOSIS — C50211 Malignant neoplasm of upper-inner quadrant of right female breast: Secondary | ICD-10-CM | POA: Diagnosis not present

## 2022-10-09 DIAGNOSIS — M810 Age-related osteoporosis without current pathological fracture: Secondary | ICD-10-CM

## 2022-10-09 DIAGNOSIS — Z170 Estrogen receptor positive status [ER+]: Secondary | ICD-10-CM

## 2022-10-09 LAB — CMP (CANCER CENTER ONLY)
ALT: 14 U/L (ref 0–44)
AST: 18 U/L (ref 15–41)
Albumin: 3.9 g/dL (ref 3.5–5.0)
Alkaline Phosphatase: 69 U/L (ref 38–126)
Anion gap: 7 (ref 5–15)
BUN: 16 mg/dL (ref 8–23)
CO2: 28 mmol/L (ref 22–32)
Calcium: 9 mg/dL (ref 8.9–10.3)
Chloride: 103 mmol/L (ref 98–111)
Creatinine: 0.97 mg/dL (ref 0.44–1.00)
GFR, Estimated: 59 mL/min — ABNORMAL LOW (ref >60)
Glucose, Bld: 165 mg/dL — ABNORMAL HIGH (ref 70–99)
Potassium: 4.2 mmol/L (ref 3.5–5.1)
Sodium: 138 mmol/L (ref 135–145)
Total Bilirubin: 0.4 mg/dL (ref 0.3–1.2)
Total Protein: 6.7 g/dL (ref 6.5–8.1)

## 2022-10-09 LAB — CBC WITH DIFFERENTIAL (CANCER CENTER ONLY)
Abs Immature Granulocytes: 0.01 10*3/uL (ref 0.00–0.07)
Basophils Absolute: 0 10*3/uL (ref 0.0–0.1)
Basophils Relative: 1 %
Eosinophils Absolute: 0.1 10*3/uL (ref 0.0–0.5)
Eosinophils Relative: 3 %
HCT: 40.6 % (ref 36.0–46.0)
Hemoglobin: 13.1 g/dL (ref 12.0–15.0)
Immature Granulocytes: 0 %
Lymphocytes Relative: 28 %
Lymphs Abs: 1.1 10*3/uL (ref 0.7–4.0)
MCH: 28.7 pg (ref 26.0–34.0)
MCHC: 32.3 g/dL (ref 30.0–36.0)
MCV: 89 fL (ref 80.0–100.0)
Monocytes Absolute: 0.4 10*3/uL (ref 0.1–1.0)
Monocytes Relative: 9 %
Neutro Abs: 2.4 10*3/uL (ref 1.7–7.7)
Neutrophils Relative %: 59 %
Platelet Count: 257 10*3/uL (ref 150–400)
RBC: 4.56 MIL/uL (ref 3.87–5.11)
RDW: 14.6 % (ref 11.5–15.5)
WBC Count: 4.1 10*3/uL (ref 4.0–10.5)
nRBC: 0 % (ref 0.0–0.2)

## 2022-10-09 MED ORDER — DENOSUMAB 60 MG/ML ~~LOC~~ SOSY
60.0000 mg | PREFILLED_SYRINGE | Freq: Once | SUBCUTANEOUS | Status: AC
  Administered 2022-10-09: 60 mg via SUBCUTANEOUS
  Filled 2022-10-09: qty 1

## 2022-10-09 NOTE — Progress Notes (Signed)
Pt. Here for Prolia injection.  Blood pressure readings are elevated as charted.  Pt. States she's nervous and stressed due to a phone call stating her close friend was in the hospital and had an emergency surgery.  Denies any headaches, dizziness, chest pressure, numbness, nor tingling.  Dr. Pamelia Hoit nurse states ok to receive injection, recheck blood pressure at home and contact PCP if it remains elevated.  Pt. Made aware.  States she will go home now to take her 12 noon medication before going to the hospital to see her friend.

## 2022-10-09 NOTE — Patient Instructions (Signed)
Denosumab Injection (Osteoporosis) What is this medication? DENOSUMAB (den oh SUE mab) prevents and treats osteoporosis. It works by making your bones stronger and less likely to break (fracture). It is a monoclonal antibody. This medicine may be used for other purposes; ask your health care provider or pharmacist if you have questions. COMMON BRAND NAME(S): Prolia What should I tell my care team before I take this medication? They need to know if you have any of these conditions: Dental or gum disease, or plan to have dental surgery or a tooth pulled Infection Kidney disease Low levels of calcium or vitamin D in your blood On dialysis Poor nutrition Skin conditions Thyroid disease, or have had thyroid or parathyroid surgery Trouble absorbing minerals in your stomach or intestine An unusual or allergic reaction to denosumab, other medications, foods, dyes, or preservatives Pregnant or trying to get pregnant Breastfeeding How should I use this medication? This medication is injected under the skin. It is given by your care team in a hospital or clinic setting. A special MedGuide will be given to you before each treatment. Be sure to read this information carefully each time. Talk to your care team about the use of this medication in children. Special care may be needed. Overdosage: If you think you have taken too much of this medicine contact a poison control center or emergency room at once. NOTE: This medicine is only for you. Do not share this medicine with others. What if I miss a dose? Keep appointments for follow-up doses. It is important not to miss your dose. Call your care team if you are unable to keep an appointment. What may interact with this medication? Do not take this medication with any of the following: Other medications that contain denosumab This medication may also interact with the following: Medications that lower your chance of fighting infection Steroid  medications, such as prednisone or cortisone This list may not describe all possible interactions. Give your health care provider a list of all the medicines, herbs, non-prescription drugs, or dietary supplements you use. Also tell them if you smoke, drink alcohol, or use illegal drugs. Some items may interact with your medicine. What should I watch for while using this medication? Your condition will be monitored carefully while you are receiving this medication. You may need blood work while taking this medication. This medication may increase your risk of getting an infection. Call your care team for advice if you get a fever, chills, sore throat, or other symptoms of a cold or flu. Do not treat yourself. Try to avoid being around people who are sick. Tell your dentist and dental surgeon that you are taking this medication. You should not have major dental surgery while on this medication. See your dentist to have a dental exam and fix any dental problems before starting this medication. Take good care of your teeth while on this medication. Make sure you see your dentist for regular follow-up appointments. You should make sure you get enough calcium and vitamin D while you are taking this medication. Discuss the foods you eat and the vitamins you take with your care team. Talk to your care team if you are pregnant or think you might be pregnant. This medication can cause serious birth defects if taken during pregnancy and for 5 months after the last dose. You will need a negative pregnancy test before starting this medication. Contraception is recommended while taking this medication and for 5 months after the last dose. Your care   team can help you find the option that works for you. Talk to your care team before breastfeeding. Changes to your treatment plan may be needed. What side effects may I notice from receiving this medication? Side effects that you should report to your care team as soon as  possible: Allergic reactions--skin rash, itching, hives, swelling of the face, lips, tongue, or throat Infection--fever, chills, cough, sore throat, wounds that don't heal, pain or trouble when passing urine, general feeling of discomfort or being unwell Low calcium level--muscle pain or cramps, confusion, tingling, or numbness in the hands or feet Osteonecrosis of the jaw--pain, swelling, or redness in the mouth, numbness of the jaw, poor healing after dental work, unusual discharge from the mouth, visible bones in the mouth Severe bone, joint, or muscle pain Skin infection--skin redness, swelling, warmth, or pain Side effects that usually do not require medical attention (report these to your care team if they continue or are bothersome): Back pain Headache Joint pain Muscle pain Pain in the hands, arms, legs, or feet Runny or stuffy nose Sore throat This list may not describe all possible side effects. Call your doctor for medical advice about side effects. You may report side effects to FDA at 1-800-FDA-1088. Where should I keep my medication? This medication is given in a hospital or clinic. It will not be stored at home. NOTE: This sheet is a summary. It may not cover all possible information. If you have questions about this medicine, talk to your doctor, pharmacist, or health care provider.  2023 Elsevier/Gold Standard (2021-10-17 00:00:00)  

## 2022-10-10 ENCOUNTER — Other Ambulatory Visit: Payer: Self-pay | Admitting: Hematology

## 2022-10-24 LAB — HM DIABETES EYE EXAM

## 2022-10-25 ENCOUNTER — Encounter: Payer: Self-pay | Admitting: Internal Medicine

## 2022-10-25 LAB — HM DIABETES EYE EXAM

## 2022-10-26 ENCOUNTER — Encounter: Payer: Self-pay | Admitting: Internal Medicine

## 2022-12-07 ENCOUNTER — Other Ambulatory Visit: Payer: Self-pay | Admitting: Internal Medicine

## 2022-12-07 IMAGING — CT CT CHEST HIGH RESOLUTION
2 of 7 series · 14 of 36 positions shown, 17 images · non-contrast
Comparison: CT chest angio dated April 18, 2005

CLINICAL DATA: Interstitial lung disease

EXAM:
CT CHEST WITHOUT CONTRAST
TECHNIQUE: Multidetector CT imaging of the chest was performed following the
standard protocol without intravenous contrast. High resolution
imaging of the lungs, as well as inspiratory and expiratory imaging,
was performed.

[Series 4: high resolution · axial · 0.59mm/px · z∈[-290,-56]mm · 11 of 281 slices shown, 14 images]
[im 24/281  mediastinal]
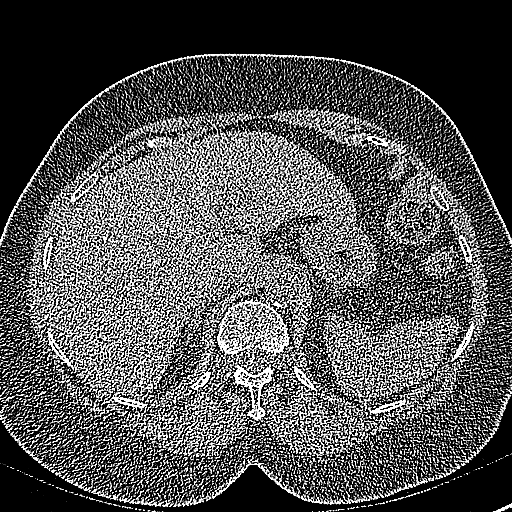
[im 24/281  lung]
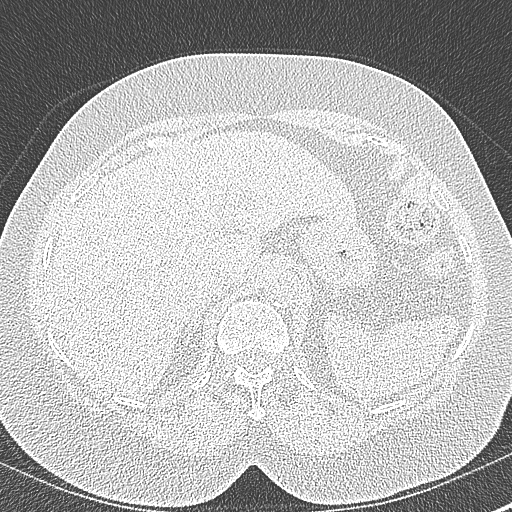
[im 47/281  lung]
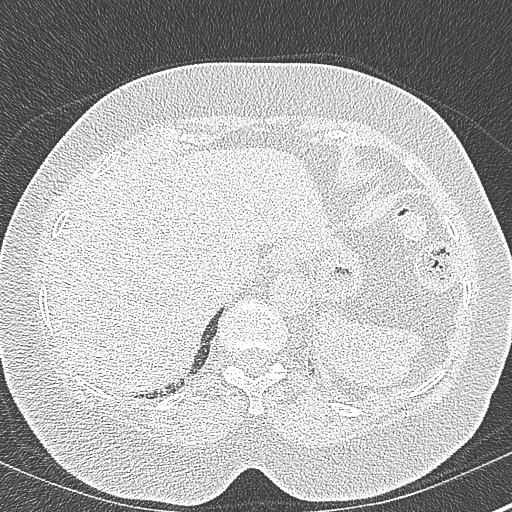
[im 71/281  lung]
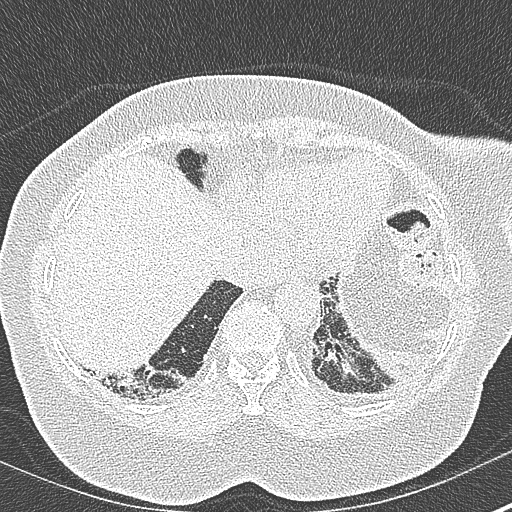
[im 94/281  lung]
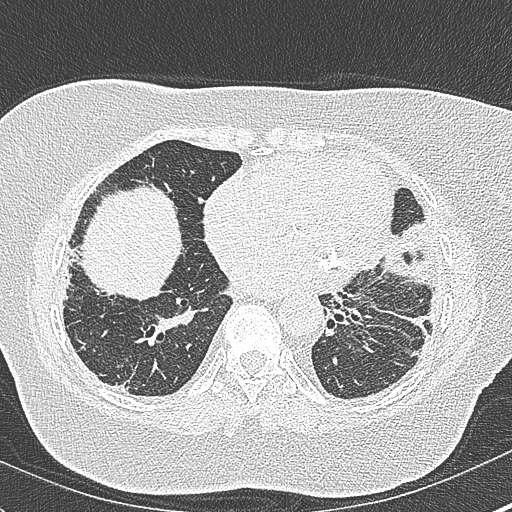
[im 117/281  mediastinal]
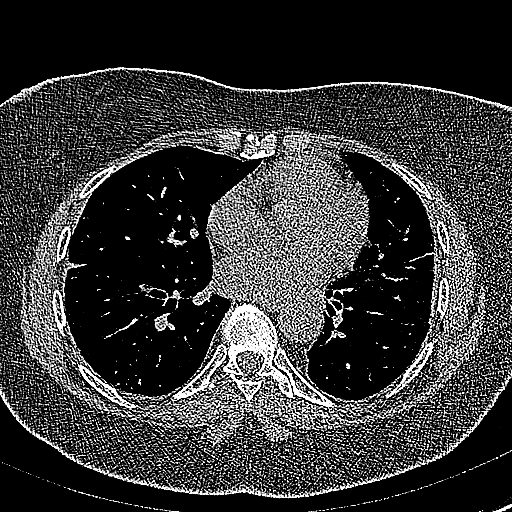
[im 117/281  lung]
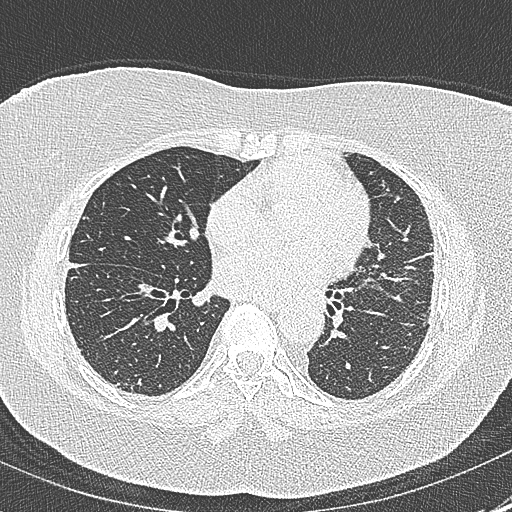
[im 141/281  lung]
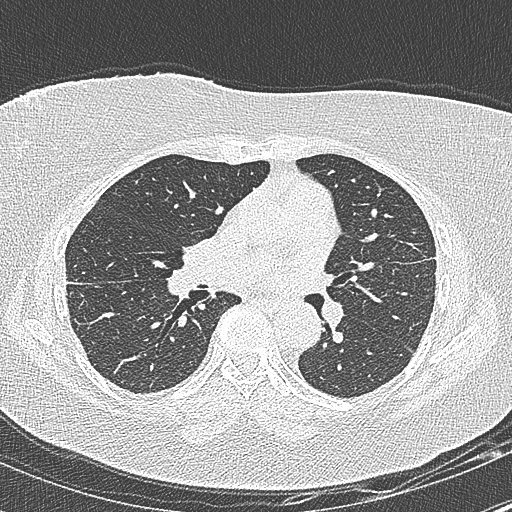
[im 164/281  lung]
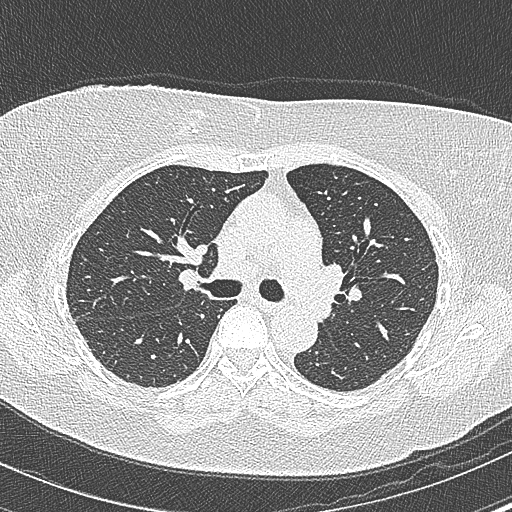
[im 187/281  lung]
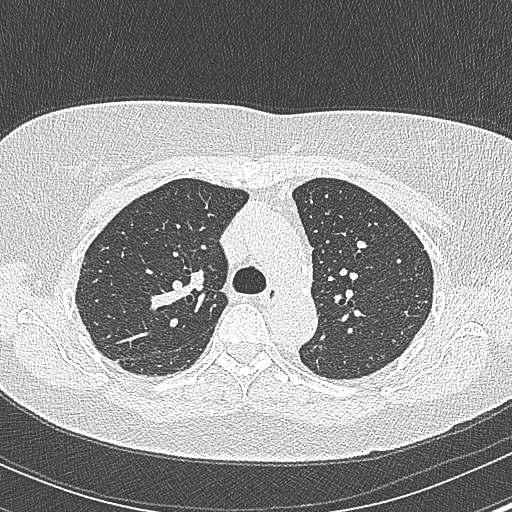
[im 211/281  mediastinal]
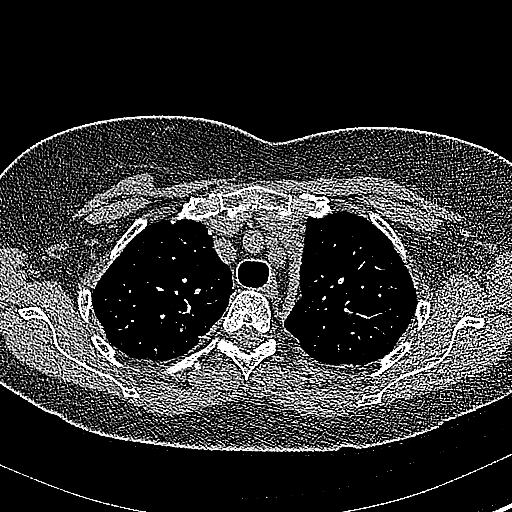
[im 211/281  lung]
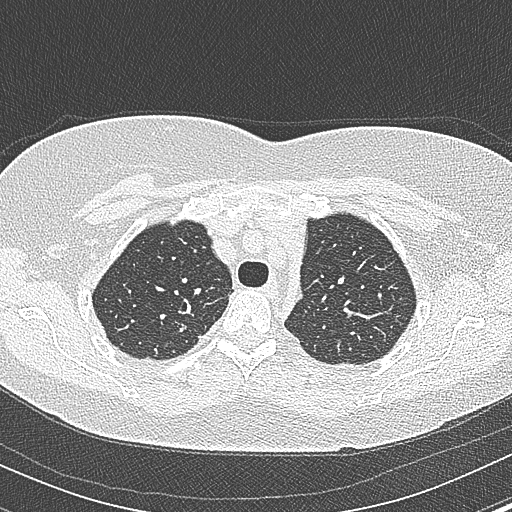
[im 234/281  lung]
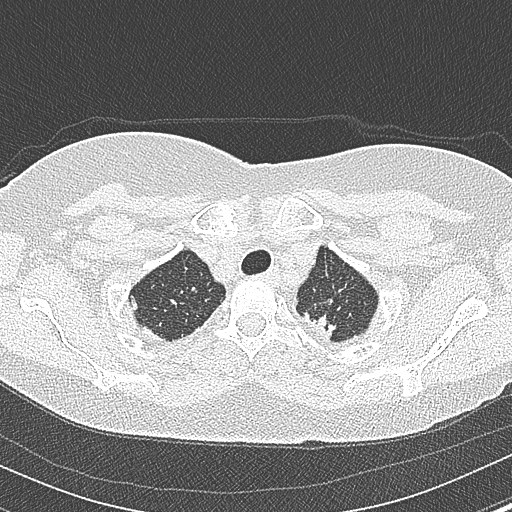
[im 257/281  lung]
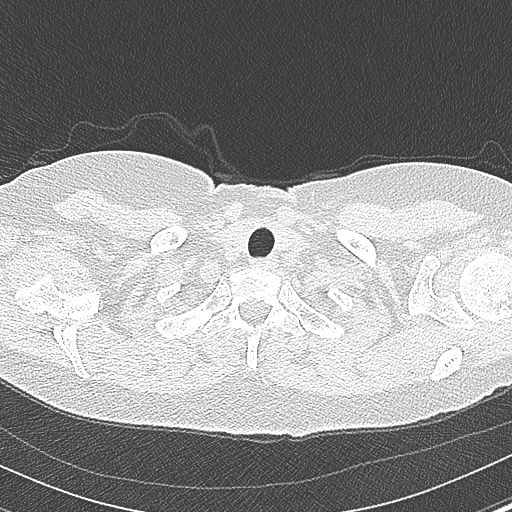

[Series 6: coronal · coronal · 0.59mm/px · 3 of 100 slices shown]
[im 20/100  lung]
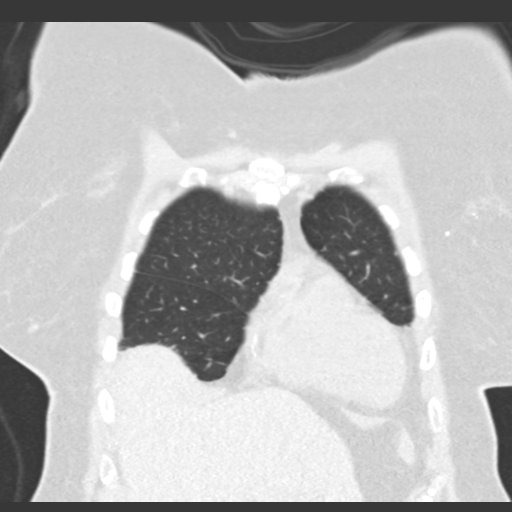
[im 40/100  lung]
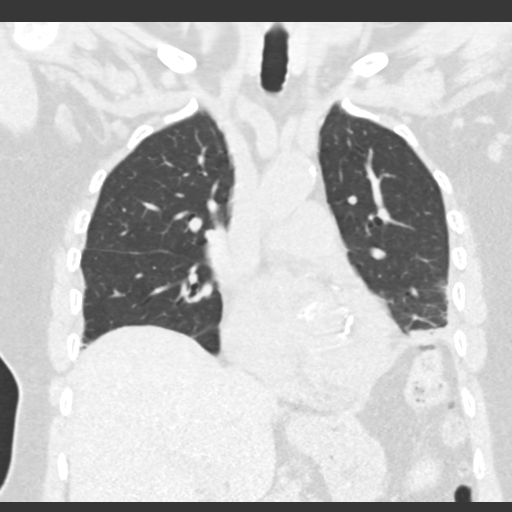
[im 60/100  lung]
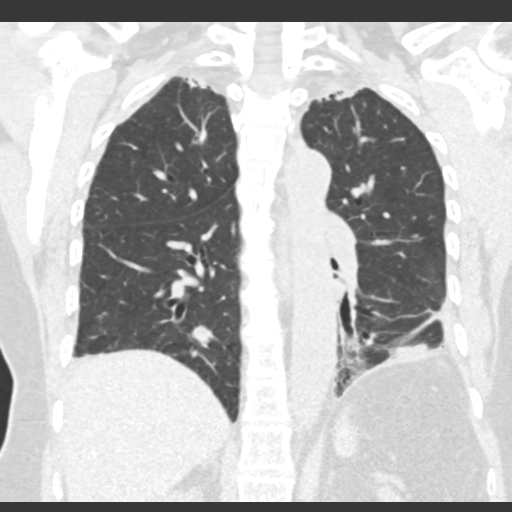

[14 of 36 positions shown; findings below may reference images not displayed]

FINDINGS: Cardiovascular: No pericardial effusion. Three-vessel coronary
artery calcifications. Mitral annular calcifications.
Atherosclerotic disease of the thoracic aorta.

Mediastinum/Nodes: Small hiatal hernia. Thyroid is unremarkable. No
pathologically enlarged lymph nodes seen in the chest.

Lungs/Pleura: Central airways are patent. No significant air
trapping. Biapical pleuroparenchymal scarring. No consolidation,
pleural effusion or pneumothorax. Bilateral linear opacities most
prominent in the lower lungs lower lungs with scattered areas of
bronchiectasis. No honeycomb change. Nodular opacity of the right
lower lobe measuring 11 mm in mean diameter on series 4, image 204.

Upper Abdomen: No acute abnormality.

Musculoskeletal: No chest wall mass or suspicious bone lesions
identified.
IMPRESSION: 1. Lower lung predominant linear opacities with scattered areas of
bronchiectasis, differential considerations include fibrotic
sarcoidosis or sequela of prior infection given areas of scarring
correlate with consolidations seen on April 18, 2005 prior CT.
Findings are suggestive of an alternative diagnosis (not UIP) per
consensus guidelines: Diagnosis of Idiopathic Pulmonary Fibrosis: An
Official ATS/ERS/JRS/ALAT Clinical Practice Guideline. Am J Respir
Crit Care Med Vol 198, Qlf 5, ppe22-e[DATE].
2. Nodular opacity of the right lower lobe measuring 11 mm in mean
diameter, possibly an area of focal fibrosis. Consider one of the
following in 3 months for both low-risk and high-risk individuals:
(a) repeat chest CT, (b) follow-up PET-CT, or (c) tissue sampling.
This recommendation follows the consensus statement: Guidelines for
Management of Incidental Pulmonary Nodules Detected on CT Images:
3. Three-vessel coronary artery calcifications, recommend ASCVD risk
assessment.
4. Aortic Atherosclerosis ([id number].A).

## 2022-12-25 ENCOUNTER — Telehealth: Payer: Self-pay | Admitting: Internal Medicine

## 2022-12-25 NOTE — Telephone Encounter (Signed)
Patient said she brought in paper work over a month ago that was needed for her to continue getting dapagliflozin propanediol (FARXIGA) 10 MG TABS tablet. She was told that it was not received and she can't get a refill of it.  She also said she was told she would get a dosage increase of glimepiride (AMARYL) from 4 mg to 8 mg. She would like to know if that can be done.  Patient would like a call back at [phone].

## 2022-12-26 ENCOUNTER — Other Ambulatory Visit (HOSPITAL_COMMUNITY): Payer: Self-pay

## 2022-12-26 ENCOUNTER — Other Ambulatory Visit: Payer: Self-pay

## 2022-12-26 ENCOUNTER — Encounter: Payer: Self-pay | Admitting: Hematology and Oncology

## 2022-12-26 ENCOUNTER — Telehealth: Payer: Self-pay

## 2022-12-26 DIAGNOSIS — E1139 Type 2 diabetes mellitus with other diabetic ophthalmic complication: Secondary | ICD-10-CM

## 2022-12-26 MED ORDER — DAPAGLIFLOZIN PROPANEDIOL 10 MG PO TABS
ORAL_TABLET | ORAL | 1 refills | Status: DC
Start: 2022-12-26 — End: 2022-12-26

## 2022-12-26 MED ORDER — DAPAGLIFLOZIN PROPANEDIOL 10 MG PO TABS
ORAL_TABLET | ORAL | 1 refills | Status: DC
Start: 2022-12-26 — End: 2023-04-02

## 2022-12-26 NOTE — Telephone Encounter (Signed)
Patient Advocate Encounter  Prior Authorization for Brooke [name] 10mg  has been approved with [employer].    PA# [id number] Effective dates: 11/26/22 through 12/26/23

## 2022-12-26 NOTE — Telephone Encounter (Signed)
Patient needs a PA on FARXIGA mediation

## 2022-12-26 NOTE — Telephone Encounter (Signed)
I do not see documentation from provider about dosage increase for glimepride. I will send in refill with current dosage until provider comes back in office

## 2023-01-05 ENCOUNTER — Other Ambulatory Visit: Payer: Self-pay | Admitting: Internal Medicine

## 2023-02-06 IMAGING — DX DG CHEST 1V PORT
1 series · 1 of 1 positions shown · non-contrast
Comparison: Chest radiographs February 20, 2020.

CLINICAL DATA: Status post bronchoscopy.

EXAM:
PORTABLE CHEST 1 VIEW

[chest ap]
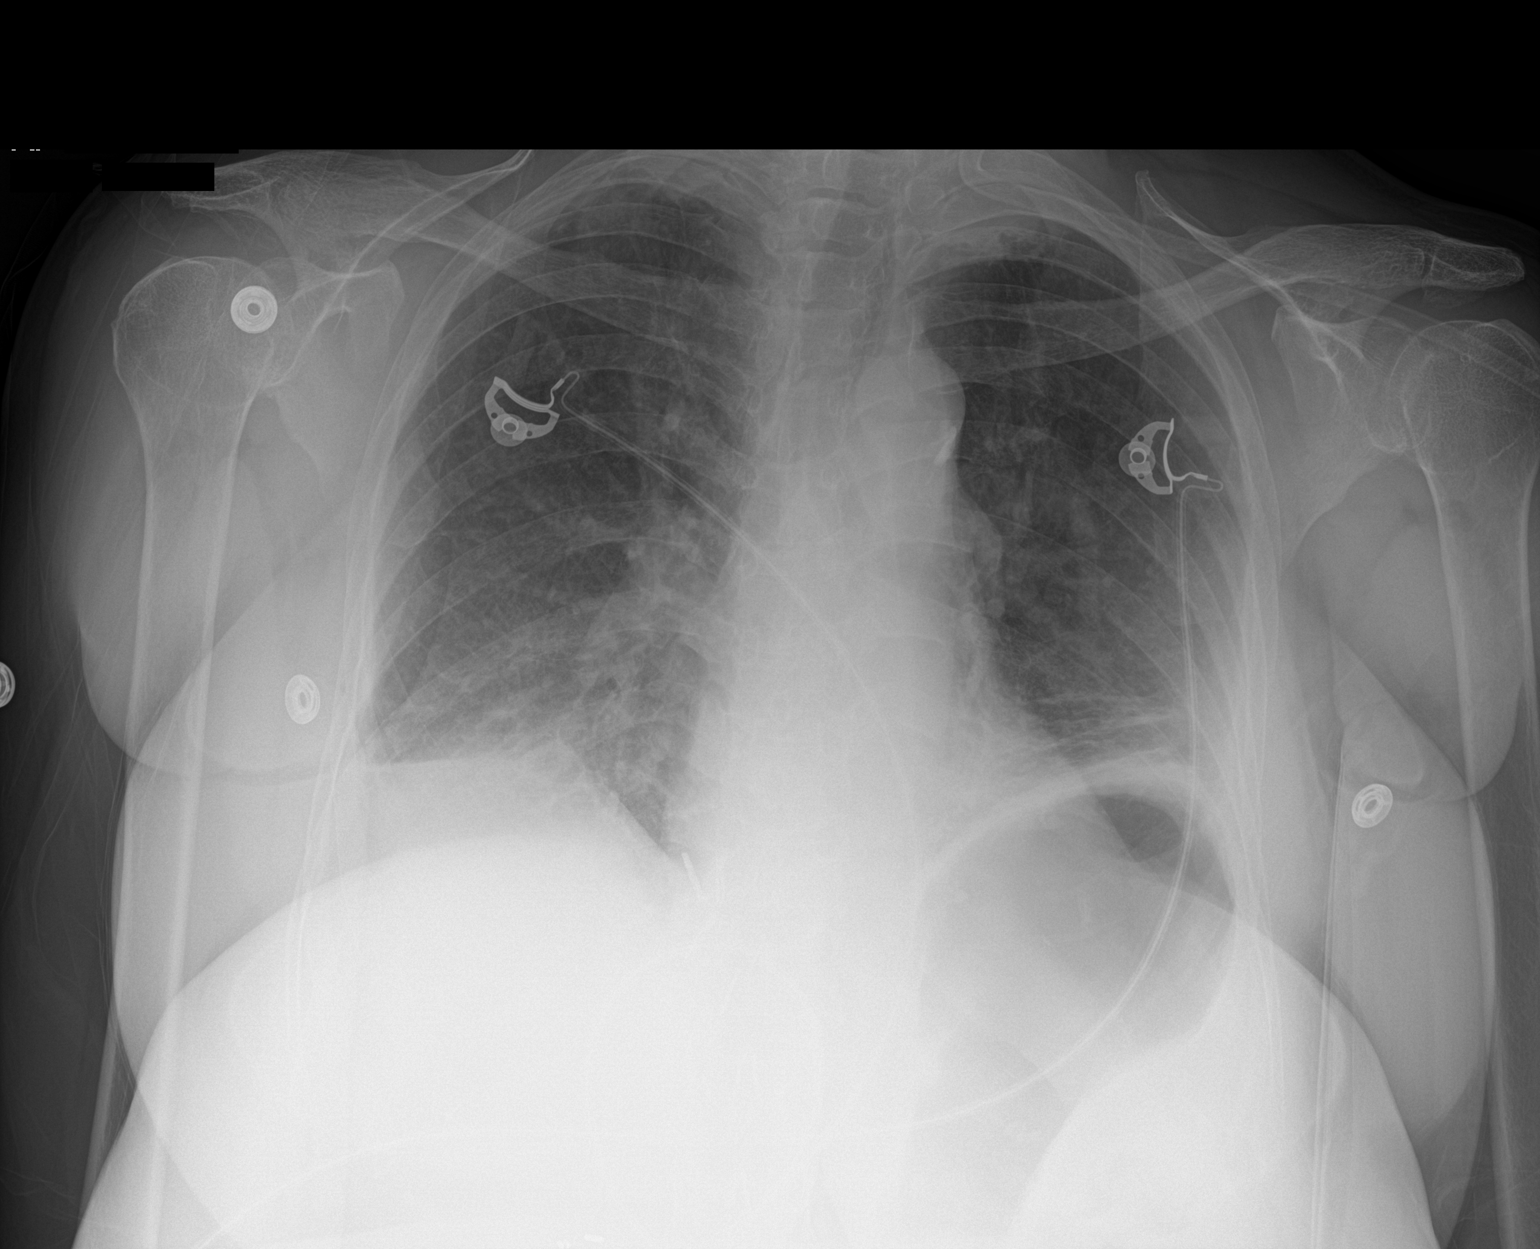

[1 of 1 positions shown; findings below may reference images not displayed]

FINDINGS: Monitoring leads overlie the patient. Stable cardiac and mediastinal
contours. Bibasilar patchy areas of consolidation. Possible trace
bilateral pleural effusions. No definite pneumothorax. Thoracic
spine degenerative changes.
IMPRESSION: Persistent bilateral mid and lower lung patchy areas of
consolidation.

## 2023-02-23 ENCOUNTER — Encounter: Payer: Self-pay | Admitting: Pharmacist

## 2023-02-26 ENCOUNTER — Other Ambulatory Visit: Payer: Self-pay | Admitting: Internal Medicine

## 2023-02-27 ENCOUNTER — Encounter: Payer: Self-pay | Admitting: Internal Medicine

## 2023-02-27 ENCOUNTER — Ambulatory Visit: Payer: Federal, State, Local not specified - PPO | Admitting: Internal Medicine

## 2023-02-27 VITALS — BP 160/84 | HR 67 | Temp 98.0°F | Ht 63.0 in | Wt 145.0 lb

## 2023-02-27 DIAGNOSIS — I10 Essential (primary) hypertension: Secondary | ICD-10-CM | POA: Diagnosis not present

## 2023-02-27 DIAGNOSIS — E1139 Type 2 diabetes mellitus with other diabetic ophthalmic complication: Secondary | ICD-10-CM | POA: Diagnosis not present

## 2023-02-27 DIAGNOSIS — H42 Glaucoma in diseases classified elsewhere: Secondary | ICD-10-CM | POA: Diagnosis not present

## 2023-02-27 LAB — POCT GLYCOSYLATED HEMOGLOBIN (HGB A1C): HbA1c POC (<> result, manual entry): 8.3 % (ref 4.0–5.6)

## 2023-02-27 MED ORDER — LOSARTAN POTASSIUM-HCTZ 100-12.5 MG PO TABS: 1.0000 | ORAL_TABLET | Freq: Every day | ORAL | 3 refills | Status: DC

## 2023-02-27 NOTE — Progress Notes (Signed)
Subjective:   Patient ID: Brooke [name], female    DOB: [birth year], 77 y.o.   MRN: [id number]  HPI The patient is a 77 YO female coming in for high BP and diabetes follow up.  Review of Systems  Constitutional: Negative.   HENT: Negative.    Eyes: Negative.   Respiratory:  Negative for cough, chest tightness and shortness of breath.   Cardiovascular:  Negative for chest pain, palpitations and leg swelling.  Gastrointestinal:  Negative for abdominal distention, abdominal pain, constipation, diarrhea, nausea and vomiting.  Musculoskeletal: Negative.   Skin: Negative.   Neurological: Negative.   Psychiatric/Behavioral: Negative.      Objective:  Physical Exam Constitutional:      Appearance: She is well-developed.  HENT:     Head: Normocephalic and atraumatic.  Cardiovascular:     Rate and Rhythm: Normal rate and regular rhythm.  Pulmonary:     Effort: Pulmonary effort is normal. No respiratory distress.     Breath sounds: Normal breath sounds. No wheezing or rales.  Abdominal:     General: Bowel sounds are normal. There is no distension.     Palpations: Abdomen is soft.     Tenderness: There is no abdominal tenderness. There is no rebound.  Musculoskeletal:     Cervical back: Normal range of motion.  Skin:    General: Skin is warm and dry.  Neurological:     Mental Status: She is alert and oriented to person, place, and time.     Coordination: Coordination normal.     Vitals:   02/27/23 0908 02/27/23 0911  BP: (!) 160/84 (!) 160/84  Pulse: 67   Temp: 98 F (36.7 C)   TempSrc: Oral   SpO2: 99%   Weight: 145 lb (65.8 kg)   Height: 5\' 3"  (1.6 m)     Assessment & Plan:

## 2023-02-27 NOTE — Patient Instructions (Signed)
We will leave the sugar medicine the same.  We will change the blood pressure from losartan 100 mg daily to losartan/hydrochlorothiazide 100/12.5 mg daily so get the new one and get rid of the old one.

## 2023-03-01 ENCOUNTER — Encounter: Payer: Self-pay | Admitting: Internal Medicine

## 2023-03-02 NOTE — Assessment & Plan Note (Signed)
BP moderately high and she has been taking medication. Will stop losartan and change to losartan/hydrochlorothiazide 100/12.5 mg daily. Follow up 1-2 months and adjust as needed then.

## 2023-03-02 NOTE — Assessment & Plan Note (Signed)
POC hgA1c done and stable at goal. Continue farxiga 10 mg daily and amaryl 4 mg daily. No low sugars. Reminded about yearly eye exam.

## 2023-03-05 ENCOUNTER — Other Ambulatory Visit: Payer: Self-pay | Admitting: Internal Medicine

## 2023-03-13 ENCOUNTER — Other Ambulatory Visit: Payer: Self-pay | Admitting: Internal Medicine

## 2023-03-22 ENCOUNTER — Encounter: Payer: Self-pay | Admitting: Internal Medicine

## 2023-03-23 MED ORDER — LOSARTAN POTASSIUM 100 MG PO TABS: 100.0000 mg | ORAL_TABLET | Freq: Every day | ORAL | 3 refills | Status: DC

## 2023-03-23 MED ORDER — AMLODIPINE BESYLATE 5 MG PO TABS: 5.0000 mg | ORAL_TABLET | Freq: Every day | ORAL | 3 refills | Status: DC

## 2023-04-02 ENCOUNTER — Other Ambulatory Visit: Payer: Self-pay | Admitting: Internal Medicine

## 2023-04-02 DIAGNOSIS — E1139 Type 2 diabetes mellitus with other diabetic ophthalmic complication: Secondary | ICD-10-CM

## 2023-04-04 ENCOUNTER — Telehealth: Payer: Self-pay | Admitting: Hematology and Oncology

## 2023-04-04 NOTE — Telephone Encounter (Signed)
Rescheduled appointments per provider PAL. Patient is aware of the changes made to her upcoming appointments.

## 2023-04-10 ENCOUNTER — Inpatient Hospital Stay: Payer: Federal, State, Local not specified - PPO

## 2023-04-10 ENCOUNTER — Inpatient Hospital Stay: Payer: Federal, State, Local not specified - PPO | Admitting: Hematology and Oncology

## 2023-04-30 ENCOUNTER — Ambulatory Visit: Payer: Federal, State, Local not specified - PPO | Admitting: Internal Medicine

## 2023-05-03 ENCOUNTER — Other Ambulatory Visit: Payer: Self-pay | Admitting: *Deleted

## 2023-05-03 DIAGNOSIS — M810 Age-related osteoporosis without current pathological fracture: Secondary | ICD-10-CM

## 2023-05-04 ENCOUNTER — Inpatient Hospital Stay: Payer: Federal, State, Local not specified - PPO | Attending: Hematology and Oncology

## 2023-05-04 ENCOUNTER — Inpatient Hospital Stay: Payer: Federal, State, Local not specified - PPO | Admitting: Hematology and Oncology

## 2023-05-04 VITALS — BP 140/74 | HR 78 | Temp 97.3°F | Resp 18 | Ht 63.0 in | Wt 147.5 lb

## 2023-05-04 DIAGNOSIS — E119 Type 2 diabetes mellitus without complications: Secondary | ICD-10-CM | POA: Insufficient documentation

## 2023-05-04 DIAGNOSIS — Z170 Estrogen receptor positive status [ER+]: Secondary | ICD-10-CM | POA: Insufficient documentation

## 2023-05-04 DIAGNOSIS — Z79899 Other long term (current) drug therapy: Secondary | ICD-10-CM | POA: Diagnosis not present

## 2023-05-04 DIAGNOSIS — C50211 Malignant neoplasm of upper-inner quadrant of right female breast: Secondary | ICD-10-CM | POA: Diagnosis present

## 2023-05-04 DIAGNOSIS — Z923 Personal history of irradiation: Secondary | ICD-10-CM | POA: Diagnosis not present

## 2023-05-04 DIAGNOSIS — M810 Age-related osteoporosis without current pathological fracture: Secondary | ICD-10-CM

## 2023-05-04 LAB — CBC WITH DIFFERENTIAL (CANCER CENTER ONLY)
Abs Immature Granulocytes: 0.01 10*3/uL (ref 0.00–0.07)
Basophils Absolute: 0.1 10*3/uL (ref 0.0–0.1)
Basophils Relative: 1 %
Eosinophils Absolute: 0.2 10*3/uL (ref 0.0–0.5)
Eosinophils Relative: 5 %
HCT: 40.8 % (ref 36.0–46.0)
Hemoglobin: 13.2 g/dL (ref 12.0–15.0)
Immature Granulocytes: 0 %
Lymphocytes Relative: 31 %
Lymphs Abs: 1.4 10*3/uL (ref 0.7–4.0)
MCH: 29.5 pg (ref 26.0–34.0)
MCHC: 32.4 g/dL (ref 30.0–36.0)
MCV: 91.3 fL (ref 80.0–100.0)
Monocytes Absolute: 0.5 10*3/uL (ref 0.1–1.0)
Monocytes Relative: 11 %
Neutro Abs: 2.4 10*3/uL (ref 1.7–7.7)
Neutrophils Relative %: 52 %
Platelet Count: 250 10*3/uL (ref 150–400)
RBC: 4.47 MIL/uL (ref 3.87–5.11)
RDW: 13.6 % (ref 11.5–15.5)
WBC Count: 4.5 10*3/uL (ref 4.0–10.5)
nRBC: 0 % (ref 0.0–0.2)

## 2023-05-04 LAB — CMP (CANCER CENTER ONLY)
ALT: 17 U/L (ref 0–44)
AST: 20 U/L (ref 15–41)
Albumin: 3.9 g/dL (ref 3.5–5.0)
Alkaline Phosphatase: 80 U/L (ref 38–126)
Anion gap: 4 — ABNORMAL LOW (ref 5–15)
BUN: 15 mg/dL (ref 8–23)
CO2: 31 mmol/L (ref 22–32)
Calcium: 8.8 mg/dL — ABNORMAL LOW (ref 8.9–10.3)
Chloride: 103 mmol/L (ref 98–111)
Creatinine: 0.79 mg/dL (ref 0.44–1.00)
GFR, Estimated: >60 mL/min (ref >60)
Glucose, Bld: 144 mg/dL — ABNORMAL HIGH (ref 70–99)
Potassium: 4 mmol/L (ref 3.5–5.1)
Sodium: 138 mmol/L (ref 135–145)
Total Bilirubin: 0.4 mg/dL (ref <1.2)
Total Protein: 6.8 g/dL (ref 6.5–8.1)

## 2023-05-04 MED ORDER — TAMOXIFEN CITRATE 20 MG PO TABS: 20.0000 mg | ORAL_TABLET | Freq: Every day | ORAL | 3 refills | Status: DC

## 2023-05-04 NOTE — Assessment & Plan Note (Addendum)
05/05/2021: Right lumpectomy: Dr. Donell Beers: Grade 1 IDC 1.4 cm, margins negative, ER 100%, PR 50%, Ki67 2%, HER2 2+ by IHC, FISH negative, Oncotype DX score 23 07/27/2021: Completed radiation (Patient switched providers from Dr.Feng to myself)   Current treatment: Anastrozole started 10/17/2021, switched to tamoxifen November 2023 due to joint pain and stiffness   Tamoxifen toxicities: Denies any arthralgias or myalgias. Unfortunately patient's diabetes is not under control with A1c of 10. 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 I discussed with her that she needs to get her diabetes under control so that her coronary artery disease can be lowered and only then she can receive tamoxifen. Therefore I discontinue tamoxifen at this time and we will reassess her in 6 months to see if she gets better with her glucose control and then we can think about starting back on the tamoxifen. I discussed with her about Greggory Keen as another option to improve her glycemic control and also the importance of watching her diet (especially icecreams).   Breast cancer surveillance: Mammogram 05/10/2022: Benign breast density category B Breast exam 05/04/2023: Benign   She changed her diet significantly and her hemoglobin A1c had decreased from 10-8.  She is making a lot of changes to her diet and open to bring the A1c down to 6.  Therefore we can now start back on tamoxifen 20 mg daily.  I sent a new prescription today.  Return to clinic in 1 year for follow-up

## 2023-05-04 NOTE — Progress Notes (Signed)
Patient Care Team: Myrlene Broker, MD as PCP - General (Internal Medicine) Almond Lint, MD as Consulting Physician (General Surgery) Malachy Mood, MD as Consulting Physician (Hematology) Dorothy Puffer, MD as Consulting Physician (Radiation Oncology) Donnelly Angelica, RN as Oncology Nurse Navigator Pershing Proud, RN as Oncology Nurse Navigator Pollyann Samples, NP as Nurse Practitioner (Nurse Practitioner)  DIAGNOSIS:  Encounter Diagnosis  Name Primary?   Malignant neoplasm of upper-inner quadrant of right breast in female, estrogen receptor positive (HCC) Yes    SUMMARY OF ONCOLOGIC HISTORY: Oncology History Overview Note  Cancer Staging Malignant neoplasm of upper-inner quadrant of right breast in female, estrogen receptor positive (HCC) Staging form: Breast, AJCC 8th Edition - Clinical stage from 04/07/2021: Stage IA (cT1b, cN0, cM0, G1, ER+, PR+, HER2-) - Signed by Malachy Mood, MD on 04/19/2021    Malignant neoplasm of upper-inner quadrant of right breast in female, estrogen receptor positive (HCC)  03/29/2021 Mammogram   Right Diagnostic MM and Right Breast US  There is a 0.6 cm x 0.9 cm x 0.5 cm irregular mass with a spiculated margin in the right breast at 2 o'clock posterior depth 11 cm from the nipple. This irregular mass is hypoechoic with posterior acoustic shadowing. No significant abnormalities were seen sonographically in the right axilla.   04/07/2021 Cancer Staging   Staging form: Breast, AJCC 8th Edition - Clinical stage from 04/07/2021: Stage IA (cT1b, cN0, cM0, G1, ER+, PR+, HER2-) - Signed by Malachy Mood, MD on 04/19/2021 Stage prefix: Initial diagnosis Histologic grading system: 3 grade system   04/07/2021 Pathology Results   Diagnosis Breast, right, needle core biopsy, 2:00 o'clock 11cmfn mass - INVASIVE DUCTAL CARCINOMA - FIBROADENOMATOID NODULE WITH CALCIFICATIONS - SEE COMMENT Microscopic Comment Based on the biopsy, the carcinoma appears  Nottingham grade 1 of 3 and measures 0.4 cm in greatest linear extent.  PROGNOSTIC INDICATORS Results: The tumor cells are EQUIVOCAL for Her2 (2+). Her2 by FISH will be performed and results reported separately. Estrogen Receptor: 100%, POSITIVE, STRONG STAINING INTENSITY Progesterone Receptor: 50%, POSITIVE, MODERATE STAINING INTENSITY Proliferation Marker Ki67: 2%  FLUORESCENCE IN-SITU HYBRIDIZATION Results: GROUP 4: HER2 **NEGATIVE**   04/15/2021 Initial Diagnosis   Malignant neoplasm of upper-inner quadrant of right breast in female, estrogen receptor positive (HCC)   05/05/2021 Cancer Staging   Staging form: Breast, AJCC 8th Edition - Pathologic stage from 05/05/2021: Stage Unknown (pT1c, pNX, cM0, G1, ER+, PR+, HER2-, Oncotype DX score: 23) - Signed by Malachy Mood, MD on 08/26/2021 Stage prefix: Initial diagnosis Multigene prognostic tests performed: Oncotype DX Recurrence score range: Greater than or equal to 11 Histologic grading system: 3 grade system Residual tumor (R): R0 - None   05/05/2021 Definitive Surgery   FINAL MICROSCOPIC DIAGNOSIS:   A. BREAST, RIGHT, LUMPECTOMY:  -  Invasive ductal carcinoma, Nottingham grade 1 of 3, 1.4 cm  -  Margins uninvolved by carcinoma (<0.1cm; inferior margin; see part B)  -  Previous biopsy site changes present  -  See oncology table below   B. BREAST, RIGHT INFERIOR MARGIN, EXCISION:  -  No residual carcinoma identified   C. BREAST, RIGHT MEDIAL MARGIN, EXCISION:  -  No residual carcinoma identified    07/28/2021 - 08/24/2021 Radiation Therapy   Site Technique Total Dose (Gy) Dose per Fx (Gy) Completed Fx Beam Energies  Breast, Right: Breast_R 3D 42.56/42.56 2.66 16/16 6X, 10X  Breast, Right: Breast_R_Bst 3D 10/10 2.5 4/4 6X, 10X     10/17/2021 -  Anti-estrogen oral therapy  Adjuvant anti-estrogen therapy with Anastrozole   12/12/2021 Survivorship   SCP delivered by Santiago Glad, NP     CHIEF COMPLIANT: Follow-up to discuss  restarting tamoxifen  HISTORY OF PRESENT ILLNESS:   History of Present Illness   The patient, with a history of breast cancer and diabetes, presents for a follow-up visit. She reports significant dietary changes, including abstaining from ice cream and milkshakes, which has resulted in a decrease in her A1C from 10 to 8.3. She expresses a goal to further reduce her A1C. She has been adherent to her prescribed medication regimen, including Tamoxifen for breast cancer treatment. She has also been diligent in scheduling and attending her mammogram appointments, with the next one due in the following month. The patient also mentions a personal goal to reduce her A1C to 6 and acknowledges the importance of physical activity, such as walking, in achieving this goal.         ALLERGIES:  is allergic to codeine.  MEDICATIONS:  Current Outpatient Medications  Medication Sig Dispense Refill   tamoxifen (NOLVADEX) 20 MG tablet Take 1 tablet (20 mg total) by mouth daily. 90 tablet 3   albuterol (VENTOLIN HFA) 108 (90 Base) MCG/ACT inhaler Inhale 2 puffs into the lungs every 6 (six) hours as needed. 25.5 g 11   ALPRAZolam (XANAX) 0.5 MG tablet TAKE 1/2 TABLET(0.25 MG) BY MOUTH DAILY AS NEEDED FOR ANXIETY 30 tablet 1   amLODipine (NORVASC) 5 MG tablet Take 1 tablet (5 mg total) by mouth daily. 90 tablet 3   atorvastatin (LIPITOR) 80 MG tablet Take 1 tablet (80 mg total) by mouth daily. 90 tablet 3   BD PEN NEEDLE NANO 2ND GEN 32G X 4 MM MISC USE TO INJECT INSULIN AS DIRECTED 100 each 2   blood glucose meter kit and supplies Dispense based on patient and insurance preference. Use up to four times daily as directed. (FOR ICD-10 E10.9, E11.9). 1 each 0   carboxymethylcellulose (REFRESH PLUS) 0.5 % SOLN Place 1 drop into both eyes daily as needed (dry eyes).     escitalopram (LEXAPRO) 10 MG tablet TAKE 1 TABLET BY MOUTH EVERY DAY. DUE FOR ANNUAL APPOINTMENT IN [place]. NEED APPOINTMENT FOR FURTHER REFILLS 90  tablet 3   FARXIGA 10 MG TABS tablet TAKE 1 TABLET(10 MG) BY MOUTH DAILY 90 tablet 1   glimepiride (AMARYL) 4 MG tablet TAKE 1 TABLET(4 MG) BY MOUTH DAILY WITH BREAKFAST 90 tablet 1   glucose blood (ONETOUCH ULTRA) test strip USE TO TEST AS DIRECTED FOUR TIMES DAILY 100 strip 2   latanoprost (XALATAN) 0.005 % ophthalmic solution 1 drop in both eyes at bedtime  0   losartan (COZAAR) 100 MG tablet Take 1 tablet (100 mg total) by mouth daily. 90 tablet 3   Multiple Vitamin (MULTIVITAMIN WITH MINERALS) TABS tablet Take 1 tablet by mouth daily.     TRESIBA FLEXTOUCH 100 UNIT/ML FlexTouch Pen ADMINISTER 20 UNITS UNDER THE SKIN DAILY 18 mL 0   No current facility-administered medications for this visit.    PHYSICAL EXAMINATION: ECOG PERFORMANCE STATUS: 1 - Symptomatic but completely ambulatory  Vitals:   05/04/23 0922  BP: (!) 140/74  Pulse: 78  Resp: 18  Temp: (!) 97.3 F (36.3 C)  SpO2: 96%   Filed Weights   05/04/23 0922  Weight: 147 lb 8 oz (66.9 kg)     LABORATORY DATA:  I have reviewed the data as listed    Latest Ref Rng & Units 05/04/2023  9:11 AM 10/09/2022    8:52 AM 09/20/2022    9:48 AM  CMP  Glucose 70 - 99 mg/dL 829  562  130   BUN 8 - 23 mg/dL 15  16  17    Creatinine 0.44 - 1.00 mg/dL 8.65  7.84  6.96   Sodium 135 - 145 mmol/L 138  138  138   Potassium 3.5 - 5.1 mmol/L 4.0  4.2  4.5   Chloride 98 - 111 mmol/L 103  103  101   CO2 22 - 32 mmol/L 31  28  30    Calcium 8.9 - 10.3 mg/dL 8.8  9.0  9.0   Total Protein 6.5 - 8.1 g/dL 6.8  6.7  6.9   Total Bilirubin <1.2 mg/dL 0.4  0.4  0.4   Alkaline Phos 38 - 126 U/L 80  69  82   AST 15 - 41 U/L 20  18  16    ALT 0 - 44 U/L 17  14  12      Lab Results  Component Value Date   WBC 4.5 05/04/2023   HGB 13.2 05/04/2023   HCT 40.8 05/04/2023   MCV 91.3 05/04/2023   PLT 250 05/04/2023   NEUTROABS 2.4 05/04/2023    ASSESSMENT & PLAN:  Malignant neoplasm of upper-inner quadrant of right breast in female, estrogen  receptor positive (HCC) 05/05/2021: Right lumpectomy: Dr. Donell Beers: Grade 1 IDC 1.4 cm, margins negative, ER 100%, PR 50%, Ki67 2%, HER2 2+ by IHC, FISH negative, Oncotype DX score 23 07/27/2021: Completed radiation (Patient switched providers from Dr.Feng to myself)   Current treatment: Anastrozole started 10/17/2021, switched to tamoxifen November 2023 due to joint pain and stiffness   Tamoxifen toxicities: Denies any arthralgias or myalgias. Unfortunately patient's diabetes is not under control with A1c of 10. 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 I discussed with her that she needs to get her diabetes under control so that her coronary artery disease can be lowered and only then she can receive tamoxifen. Therefore I discontinue tamoxifen at this time and we will reassess her in 6 months to see if she gets better with her glucose control and then we can think about starting back on the tamoxifen. I discussed with her about Greggory Keen as another option to improve her glycemic control and also the importance of watching her diet (especially icecreams).   Breast cancer surveillance: Mammogram 05/10/2022: Benign breast density category B Breast exam 05/04/2023: Benign   She changed her diet significantly and her hemoglobin A1c had decreased from 10-8.  She is making a lot of changes to her diet and open to bring the A1c down to 6.  Therefore we can now start back on tamoxifen 20 mg daily.  I sent a new prescription today.   Type 2 Diabetes Mellitus A1C improved from 10 to 8.3 with dietary changes. Patient is motivated to continue lifestyle modifications to further improve glycemic control. -Encouraged continuation of dietary changes and physical activity.    Return to clinic in 1 year for follow-up      No orders of the defined types were placed in this encounter.  The patient has a good understanding of the overall plan. she agrees with it. she will call with any problems that  may develop before the next visit here. Total time spent: 30 mins including face to face time and time spent for planning, charting and co-ordination of care   Tamsen Meek, MD 05/04/23

## 2023-05-14 LAB — HM MAMMOGRAPHY

## 2023-05-15 ENCOUNTER — Encounter: Payer: Self-pay | Admitting: Internal Medicine

## 2023-05-16 ENCOUNTER — Other Ambulatory Visit: Payer: Self-pay | Admitting: Internal Medicine

## 2023-06-18 ENCOUNTER — Other Ambulatory Visit: Payer: Self-pay | Admitting: Internal Medicine

## 2023-07-08 NOTE — Progress Notes (Unsigned)
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 History pleural effusion, complicated by DVT, allergic rhinitis, DM2 Office Spirometry 10/26/16-mild restriction of exhaled volume. FVC 1.62/75%, FEV1 1.28/77%, ratio 0.79, FEF 25-75% 1.24/82% PFT 02/20/20- mild DLCO deficit ------------------------------------------------------------------------------------  . 04/27/22- 78 year old female former smoker previously followed for atypical/seronegative Sarcoid with bilateral lower zone lung volume loss, peripheral neuropathy. Complicated by  pleural effusion, DVT, allergic rhinitis, DM2, HTN,, Hyperlipidemia,  R Breast Cancer, Anxiety/ Depression, Glaucoma, -albuterol hfa,  Covid vax-5 Phizer Flu vax-had ------Pt states since getting flu shot she has been feeling "bad" and experiencing SOB Describes malaise since flu vax. "Grabbing" episodes of shortness of breath, easy fatigue, occ dry cough, marginal help from rescue inhaler. No fever, chills, bleeding, adenopathy. Discussed CT? Pneumonitis RUL. She has finished XRT for breast but continues tamoxifen. HRCT chest 12/28/21- MPRESSION: 1. Bibasilar pleuroparenchymal chronic appearing volume loss with traction bronchiectasis/bronchiolectasis, overall similar to 08/29/2021, with decreased nodular components in the right lower lobe and lingula. Findings may be due to sarcoid or organizing pneumonia. Findings are suggestive of an alternative diagnosis (not UIP) per consensus guidelines: Diagnosis of Idiopathic Pulmonary Fibrosis: An Official ATS/ERS/JRS/ALAT Clinical Practice Guideline. Am Rosezetta Schlatter Crit Care Med Vol 198, Iss 5, [phone], Feb 17 2017. 2. New peripheral peribronchovascular ground-glass in the right upper lobe, likely infectious/inflammatory etiology. 3. Air trapping is indicative of small airways disease. 4. Liver margin may be minimally  irregular, raising suspicion for cirrhosis. 5. Aortic atherosclerosis (ICD10-I70.0). Coronary artery calcification.   07/10/23-  78 year old female former smoker previously followed for atypical/seronegative Sarcoid with bilateral lower zone lung volume loss, peripheral neuropathy. Complicated by  pleural effusion, DVT, allergic rhinitis, DM2, HTN,, Hyperlipidemia,  R Breast Cancer, Anxiety/ Depression, Glaucoma, -albuterol hfa,  Discussed the use of AI scribe software for clinical note transcription with the patient, who gave verbal consent to proceed.  History of Present Illness   The patient, with a history of chronic lung disease, possibly old sarcoid, managed with albuterol, presents with recent onset hoarseness and congestion. Symptoms began on Thursday with a runny nose and voice changes, but have since improved. She denies cough, pain, sore throat, and fever. She reports possible exposure to sick individuals. The patient uses albuterol regularly, approximately every other day, and finds it effective, particularly when experiencing shortness of breath with exertion. She has received both the flu and RSV vaccines this season.     ROS-see HPI   + = positive Constitutional:   No-   weight loss, +night sweats, fevers, chills, fatigue, lassitude. HEENT:   No-  headaches, difficulty swallowing, tooth/dental problems, sore throat,       No-  sneezing, itching, ear ache, +nasal congestion, post nasal drip,  CV:  No-   chest pain, orthopnea, PND, swelling in lower extremities, anasarca,                                                                              dizziness, palpitations Resp: +shortness of breath with exertion or at rest.              No-   productive cough,  No non-productive cough,  No- coughing up of blood.              No-   change in color of mucus.  No- wheezing.   Skin: No-   rash or lesions. GI:  No-   heartburn, indigestion, abdominal pain, nausea, vomiting,  GU: . MS:   No-   joint pain or swelling.  . Neuro-     nothing unusual Psych:  No- change in mood or affect. No depression or anxiety.  No memory loss.  OBJ- Physical Exam      General- Alert, Oriented, Affect-appropriate, Distress- none acute. +Looks well. Skin- rash-none, lesions- none, excoriation- none Lymphadenopathy- none Head- atraumatic            Eyes- Gross vision intact, PERRLA, conjunctivae and secretions clear            Ears- Hearing, canals-normal            Nose- Clear, no-Septal dev, mucus, polyps, erosion, perforation             Throat- Mallampati II , mucosa clear , drainage- none, tonsils- atrophic Neck- flexible , trachea midline, no stridor , thyroid nl, carotid no bruit Chest - symmetrical excursion , unlabored           Heart/CV- RRR , no murmur , no gallop  , no rub, nl s1 s2                           - JVD- none , edema- none, stasis changes- none, varices- none           Lung- + decreased in bases, wheeze+ mild, cough-none ,   dullness-none, rub- none            Chest wall-  Abd-  Br/ Gen/ Rectal- Not done, not indicated Extrem- cyanosis- none, clubbing, none, atrophy- none, strength- nl Neuro- grossly intact to observation  Assessment and Plan    Upper Respiratory Infection Symptoms of runny nose and hoarseness without cough, fever, or sore throat. Improvement noted since onset. -No additional treatment needed at this time.  Chronic Lung Disease (Sarcoidosis) Regular use of Albuterol inhaler for dyspnea on exertion. No current need for additional treatment. -Continue Albuterol inhaler as needed. -Order chest X-ray to monitor for progressive scarring.  General Health Maintenance Up to date on influenza and RSV vaccines.

## 2023-07-10 ENCOUNTER — Encounter: Payer: Self-pay | Admitting: Internal Medicine

## 2023-07-10 ENCOUNTER — Ambulatory Visit: Payer: Federal, State, Local not specified - PPO | Admitting: Internal Medicine

## 2023-07-10 ENCOUNTER — Ambulatory Visit (INDEPENDENT_AMBULATORY_CARE_PROVIDER_SITE_OTHER): Payer: Federal, State, Local not specified - PPO

## 2023-07-10 VITALS — BP 130/80 | HR 59 | Ht 63.0 in | Wt 145.6 lb

## 2023-07-10 DIAGNOSIS — J069 Acute upper respiratory infection, unspecified: Secondary | ICD-10-CM | POA: Diagnosis not present

## 2023-07-10 DIAGNOSIS — D869 Sarcoidosis, unspecified: Secondary | ICD-10-CM | POA: Diagnosis not present

## 2023-07-10 NOTE — Patient Instructions (Signed)
Order- CXR  dx Sarcoid  Please let me know if the albuterol inhaler isn't working well enough for you.

## 2023-07-19 NOTE — Progress Notes (Signed)
Results have been relayed to the patient/authorized caretaker. The patient/authorized caretaker verbalized understanding. No questions at this time.

## 2023-08-09 ENCOUNTER — Other Ambulatory Visit: Payer: Self-pay | Admitting: Internal Medicine

## 2023-08-22 ENCOUNTER — Other Ambulatory Visit: Payer: Self-pay | Admitting: Internal Medicine

## 2023-09-19 ENCOUNTER — Encounter: Payer: Self-pay | Admitting: Family Medicine

## 2023-09-19 ENCOUNTER — Other Ambulatory Visit: Payer: Self-pay | Admitting: Family Medicine

## 2023-09-19 ENCOUNTER — Ambulatory Visit: Payer: Self-pay

## 2023-09-19 ENCOUNTER — Ambulatory Visit: Admitting: Family Medicine

## 2023-09-19 VITALS — BP 128/76 | HR 70 | Temp 97.6°F | Ht 63.0 in | Wt 139.0 lb

## 2023-09-19 DIAGNOSIS — R1013 Epigastric pain: Secondary | ICD-10-CM

## 2023-09-19 DIAGNOSIS — R140 Abdominal distension (gaseous): Secondary | ICD-10-CM | POA: Diagnosis not present

## 2023-09-19 DIAGNOSIS — Z8711 Personal history of peptic ulcer disease: Secondary | ICD-10-CM

## 2023-09-19 LAB — COMPREHENSIVE METABOLIC PANEL WITH GFR
ALT: 16 U/L (ref 0–35)
AST: 16 U/L (ref 0–37)
Albumin: 4.3 g/dL (ref 3.5–5.2)
Alkaline Phosphatase: 78 U/L (ref 39–117)
BUN: 20 mg/dL (ref 6–23)
CO2: 29 meq/L (ref 19–32)
Calcium: 9.7 mg/dL (ref 8.4–10.5)
Chloride: 98 meq/L (ref 96–112)
Creatinine, Ser: 0.95 mg/dL (ref 0.40–1.20)
GFR: 56.75 mL/min — ABNORMAL LOW (ref >60.00)
Glucose, Bld: 189 mg/dL — ABNORMAL HIGH (ref 70–99)
Potassium: 4.5 meq/L (ref 3.5–5.1)
Sodium: 135 meq/L (ref 135–145)
Total Bilirubin: 0.4 mg/dL (ref 0.2–1.2)
Total Protein: 7.5 g/dL (ref 6.0–8.3)

## 2023-09-19 LAB — CBC WITH DIFFERENTIAL/PLATELET
Basophils Absolute: 0.1 10*3/uL (ref 0.0–0.1)
Basophils Relative: 1 % (ref 0.0–3.0)
Eosinophils Absolute: 0.4 10*3/uL (ref 0.0–0.7)
Eosinophils Relative: 6.1 % — ABNORMAL HIGH (ref 0.0–5.0)
HCT: 42.6 % (ref 36.0–46.0)
Hemoglobin: 14.3 g/dL (ref 12.0–15.0)
Lymphocytes Relative: 28.7 % (ref 12.0–46.0)
Lymphs Abs: 1.7 10*3/uL (ref 0.7–4.0)
MCHC: 33.5 g/dL (ref 30.0–36.0)
MCV: 91 fl (ref 78.0–100.0)
Monocytes Absolute: 0.5 10*3/uL (ref 0.1–1.0)
Monocytes Relative: 8.4 % (ref 3.0–12.0)
Neutro Abs: 3.4 10*3/uL (ref 1.4–7.7)
Neutrophils Relative %: 55.8 % (ref 43.0–77.0)
Platelets: 282 10*3/uL (ref 150.0–400.0)
RBC: 4.68 Mil/uL (ref 3.87–5.11)
RDW: 14.1 % (ref 11.5–15.5)
WBC: 6 10*3/uL (ref 4.0–10.5)

## 2023-09-19 LAB — LIPASE: Lipase: 48 U/L (ref 11.0–59.0)

## 2023-09-19 MED ORDER — PANTOPRAZOLE SODIUM 40 MG PO TBEC
40.0000 mg | DELAYED_RELEASE_TABLET | Freq: Every day | ORAL | 1 refills | Status: DC
Start: 2023-09-19 — End: 2023-09-19

## 2023-09-19 NOTE — Progress Notes (Signed)
 Subjective:     Patient ID: Brooke [name], female    DOB: October 08, 1945, 78 y.o.   MRN: [id number]  Chief Complaint  Patient presents with   Abdominal Pain    Upper ABD pain, start Thursday and Friday, pt ate soft food and felt worse, hx of ulcer     Abdominal Pain Pertinent negatives include no constipation, diarrhea, dysuria, fever, frequency, headaches, melena, nausea or vomiting.     History of Present Illness          C/o a 5 day hx of epigastric pain with increased flatulence, belching. Pain is non radiating. No back pain.   Took Pepcid, drinking aloe vera Eating soft food.   Denies fever, chills, dizziness, N/V/D or constipation. Denies melena.   No chest pain, palpitations or DOE.   States she has hx of ulcer and diverticulitis. Last EGD by Dr. Loreta Ave in 2016.    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/2021   Peripheral neuropathy    uncertain relation to lung disease   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    Pleural effusion    Sarcoidosis     Past Surgical History:  Procedure Laterality Date   ABDOMINAL HYSTERECTOMY     BREAST LUMPECTOMY WITH RADIOACTIVE SEED LOCALIZATION Right 05/05/2021   Procedure: RIGHT BREAST LUMPECTOMY WITH RADIOACTIVE SEED LOCALIZATION;  Surgeon: Almond Lint, MD;  Location: [facility] OR;  Service: General;  Laterality: Right;   BRONCHIAL  BIOPSY  07/04/2021   Procedure: BRONCHIAL BIOPSIES;  Surgeon: Steffanie Dunn, DO;  Location: [facility] ENDOSCOPY;  Service: Cardiopulmonary;;   BRONCHIAL WASHINGS  07/04/2021   Procedure: BRONCHIAL WASHINGS;  Surgeon: Steffanie Dunn, DO;  Location: [facility];  Service: Cardiopulmonary;;   CHOLECYSTECTOMY  06/19/1973   COLONOSCOPY     ESOPHAGOGASTRODUODENOSCOPY     ESOPHAGOGASTRODUODENOSCOPY N/A 08/09/2014   Procedure: ESOPHAGOGASTRODUODENOSCOPY (EGD);  Surgeon: Iva Boop, MD;  Location: [facility] ENDOSCOPY;  Service: Endoscopy;  Laterality: N/A;   FOOT SURGERY  06/20/2011   left   HEMOSTASIS CONTROL  07/04/2021   Procedure: HEMOSTASIS CONTROL;  Surgeon: Steffanie Dunn, DO;  Location: [facility] ENDOS[facility];  Service: Cardiopulmonary;;   VIDEO BRONCHOSCOPY N/A 07/04/2021   Procedure: VIDEO BRONCHOSCOPY WITH FLUORO;  Surgeon: Steffanie Dunn, DO;  Location: [facility];  Service: Cardiopulmonary;  Laterality: N/A;    Family History  Problem Relation Age of Onset   Hypertension Mother    Colon cancer Neg Hx     Social History   Socioeconomic History   Marital status: Single    Spouse name: Not on file   Number of children: Not on file   Years of education: Not on file   Highest education level: Not on file  Occupational History   Not on file  Tobacco Use   Smoking status: Former    Current  packs/day: 0.00    Average packs/day: (0.5 ttl pk-yrs)    Types: Cigarettes    Start date: 06/20/1967    Quit date: 06/19/1982    Years since quitting: 41.2   Smokeless tobacco: Never  Vaping Use   Vaping status: Never Used  Substance and Sexual Activity   Alcohol use: No   Drug use: No   Sexual activity: Not on file  Other Topics Concern   Not on file  Social History Narrative   Not on file   Social Drivers of Health   Financial Resource Strain: Low Risk  (04/20/2021)   Overall Financial Resource Strain (CARDIA)    Difficulty of Paying Living Expenses: Not very hard  Food Insecurity: No Food  Insecurity (04/20/2021)   Hunger Vital Sign    Worried About Running Out of Food in the Last Year: Never true    Ran Out of Food in the Last Year: Never true  Transportation Needs: No Transportation Needs (04/20/2021)   PRAPARE - Administrator, Civil Service (Medical): No    Lack of Transportation (Non-Medical): No  Physical Activity: Not on file  Stress: Not on file  Social Connections: Not on file  Intimate Partner Violence: Not on file    Outpatient Medications Prior to Visit  Medication Sig Dispense Refill   albuterol (VENTOLIN HFA) 108 (90 Base) MCG/ACT inhaler Inhale 2 puffs into the lungs every 6 (six) hours as needed. 25.5 g 11   atorvastatin (LIPITOR) 80 MG tablet Take 1 tablet (80 mg total) by mouth daily. 90 tablet 3   BD PEN NEEDLE NANO 2ND GEN 32G X 4 MM MISC USE TO INJECT INSULIN AS DIRECTED 100 each 2   blood glucose meter kit and supplies Dispense based on patient and insurance preference. Use up to four times daily as directed. (FOR ICD-10 E10.9, E11.9). 1 each 0   carboxymethylcellulose (REFRESH PLUS) 0.5 % SOLN Place 1 drop into both eyes daily as needed (dry eyes).     escitalopram (LEXAPRO) 10 MG tablet TAKE 1 TABLET BY MOUTH EVERY DAY 90 tablet 3   FARXIGA 10 MG TABS tablet TAKE 1 TABLET(10 MG) BY MOUTH DAILY 90 tablet 1   glimepiride (AMARYL) 4 MG tablet TAKE 1 TABLET(4 MG) BY MOUTH DAILY WITH BREAKFAST 90 tablet 1   glucose blood (ONETOUCH ULTRA) test strip USE TO TEST AS DIRECTED FOUR TIMES DAILY 100 strip 2   latanoprost (XALATAN) 0.005 % ophthalmic solution 1 drop in both eyes at bedtime  0   losartan (COZAAR) 100 MG tablet Take 1 tablet (100 mg total) by mouth daily. 90 tablet 3   Multiple Vitamin (MULTIVITAMIN WITH MINERALS) TABS tablet Take 1 tablet by mouth daily.     tamoxifen (NOLVADEX) 20 MG tablet Take 1 tablet (20 mg total) by mouth daily. 90 tablet 3   TRESIBA FLEXTOUCH 100 UNIT/ML FlexTouch Pen ADMINISTER 20 UNITS UNDER THE SKIN DAILY 18 mL  0   ALPRAZolam (XANAX) 0.5 MG tablet TAKE 1/2 TABLET(0.25 MG) BY MOUTH DAILY AS NEEDED FOR ANXIETY (Patient not taking: Reported on 09/19/2023) 30 tablet 0   amLODipine (NORVASC) 5 MG tablet Take 1 tablet (5 mg total) by mouth daily. (Patient not taking: Reported on 09/19/2023) 90 tablet 3   No facility-administered medications prior to visit.    Allergies  Allergen Reactions   Codeine Nausea And Vomiting    Review of Systems  Constitutional:  Negative for chills, fever and malaise/fatigue.  Eyes:  Negative for  blurred vision and double vision.  Respiratory:  Negative for cough and shortness of breath.   Cardiovascular:  Negative for chest pain, palpitations, orthopnea and leg swelling.  Gastrointestinal:  Positive for abdominal pain. Negative for blood in stool, constipation, diarrhea, melena, nausea and vomiting.       Belching and flatulence   Genitourinary:  Negative for dysuria, frequency and urgency.  Musculoskeletal:  Negative for back pain.  Neurological:  Negative for dizziness, focal weakness and headaches.       Objective:    Physical Exam Constitutional:      General: She is not in acute distress.    Appearance: She is not ill-appearing.  HENT:     Mouth/Throat:     Mouth: Mucous membranes are moist.  Eyes:     Extraocular Movements: Extraocular movements intact.     Conjunctiva/sclera: Conjunctivae normal.  Cardiovascular:     Rate and Rhythm: Normal rate and regular rhythm.  Pulmonary:     Effort: Pulmonary effort is normal.     Breath sounds: Normal breath sounds.  Abdominal:     General: Bowel sounds are normal. There is no distension.     Palpations: Abdomen is soft. There is no pulsatile mass.     Tenderness: There is abdominal tenderness in the right upper quadrant, epigastric area and left upper quadrant. There is no right CVA tenderness, left CVA tenderness, guarding or rebound. Negative signs include Murphy's sign and McBurney's sign.  Musculoskeletal:      Cervical back: Normal range of motion and neck supple.  Skin:    General: Skin is warm and dry.     Coloration: Skin is not pale.  Neurological:     General: No focal deficit present.     Mental Status: She is alert and oriented to person, place, and time.     Cranial Nerves: No cranial nerve deficit.     Motor: No weakness.  Psychiatric:        Mood and Affect: Mood normal. Mood is not anxious.        Behavior: Behavior normal.        Thought Content: Thought content normal.      BP 128/76 (BP Location: Left Arm, Patient Position: Sitting)   Pulse 70   Temp 97.6 F (36.4 C) (Temporal)   Ht 5\' 3"  (1.6 m)   Wt 139 lb (63 kg)   SpO2 96%   BMI 24.62 kg/m  Wt Readings from Last 3 Encounters:  09/19/23 139 lb (63 kg)  07/10/23 145 lb 9.6 oz (66 kg)  05/04/23 147 lb 8 oz (66.9 kg)       Assessment & Plan:   Problem List Items Addressed This Visit   None Visit Diagnoses       Epigastric abdominal pain    -  Primary   Relevant Orders   CBC with Differential/Platelet   Comprehensive metabolic panel with GFR   Lipase   CT ABDOMEN PELVIS W CONTRAST   EKG 12-Lead     Flatulence/gas pain/belching         History of gastric ulcer          She is not in acute distress.  No red flag symptoms.  EKG shows NSR, rate 72, no acute ST or T wave changes.  Does not appear to be cardiopulmonary in nature. History of gastric ulcer with her last EGD in 2016. No changes in bowel habits Start Protonix, avoid NSAIDs, and eat a bland diet.  Stat CT abdomen pelvis with contrast ordered. Stat CBC, CMP and lipase also ordered. Precautions that if she develops any new or worsening symptoms that she will call 911 or go to the emergency department.  I am having Jeronica C. Edgren start on pantoprazole. I am also having her maintain her latanoprost, blood glucose meter kit and supplies, carboxymethylcellulose, multivitamin with minerals, albuterol, atorvastatin, OneTouch Ultra, glimepiride,  BD Pen Needle Nano 2nd Gen, losartan, amLODipine, Farxiga, tamoxifen, escitalopram, Tresiba FlexTouch, and ALPRAZolam.  Meds ordered this encounter  Medications   pantoprazole (PROTONIX) 40 MG tablet    Sig: Take 1 tablet (40 mg total) by mouth daily.    Dispense:  30 tablet    Refill:  1    Supervising Provider:   Hillard Danker A [[id number]]

## 2023-09-19 NOTE — Telephone Encounter (Signed)
 Chief Complaint: upper abd pain Symptoms: pain, gas Frequency: about a week Pertinent Negatives: Patient denies fever, N/V, SOB, difficulty breathing Disposition: [] ED /[] Urgent Care (no appt availability in office) / [x] Appointment(In office/virtual)/ []  [facility]/ [] Home Care/ [] Refused Recommended Disposition /[] [facility]/ []  Follow-up with PCP Additional Notes: Pt states that she started having upper abd pain last week. Pt states that it is similar to her ulcer as well as diverticulosis. Denies radiation. Denies CP, SOB. Pt scheduled for today.   Copied from CRM [id number]. Topic: Clinical - Red Word Triage >> Sep 19, 2023  8:24 AM Saverio Danker wrote: Red Word that prompted transfer to Nurse Triage: severe pain Reason for Disposition  [1] MILD-MODERATE pain AND [2] constant AND [3] present > 2 hours  Answer Assessment - Initial Assessment Questions 1. LOCATION: "Where does it hurt?"      Upper abd pain 2. RADIATION: "Does the pain shoot anywhere else?" (e.g., chest, back)     denies 3. ONSET: "When did the pain begin?" (e.g., minutes, hours or days ago)      Last week 4. SUDDEN: "Gradual or sudden onset?"     gradual 5. PATTERN "Does the pain come and go, or is it constant?"    - If it comes and goes: "How long does it last?" "Do you have pain now?"     (Note: Comes and goes means the pain is intermittent. It goes away completely between bouts.)    - If constant: "Is it getting better, staying the same, or getting worse?"      (Note: Constant means the pain never goes away completely; most serious pain is constant and gets worse.)      constant 6. SEVERITY: "How bad is the pain?"  (e.g., Scale 1-10; mild, moderate, or severe)    - MILD (1-3): Doesn't interfere with normal activities, abdomen soft and not tender to touch..     - MODERATE (4-7): Interferes with normal activities or awakens from sleep, abdomen tender to touch.     - SEVERE (8-10): Excruciating  pain, doubled over, unable to do any normal activities.       6 7. RECURRENT SYMPTOM: "Have you ever had this type of stomach pain before?" If Yes, ask: "When was the last time?" and "What happened that time?"      Yes I had it when I had an ulcer and when I have diverticulosis 8. AGGRAVATING FACTORS: "Does anything seem to cause this pain?" (e.g., foods, stress, alcohol)     I have stopped eating 9. CARDIAC SYMPTOMS: "Do you have any of the following symptoms: chest pain, difficulty breathing, sweating, nausea?"     denies 10. OTHER SYMPTOMS: "Do you have any other symptoms?" (e.g., back pain, diarrhea, fever, urination pain, vomiting)       Excessive gas,  Protocols used: Abdominal Pain - Upper-A-AH

## 2023-09-19 NOTE — Patient Instructions (Addendum)
 Please go downstairs for labs before you leave.  I ordered a CT scan of your abdomen and you should receive a call today to get this scheduled.  Start daily pantoprazole for acid reflux and in case of an ulcer. Avoid NSAIDs.   Eat a bland diet. Stay hydrated.   You may need to follow up with your gastroenterologist.   If your pain worsens at all or if you have any new symptoms such as fever, chills, fatigue, dizziness, chest pain, vomiting, then you should go to the emergency department.

## 2023-09-21 ENCOUNTER — Ambulatory Visit (HOSPITAL_COMMUNITY)
Admission: RE | Admit: 2023-09-21 | Discharge: 2023-09-21 | Disposition: A | Discharge status: Home / Self Care | Source: Ambulatory Visit | Attending: Family Medicine | Admitting: Family Medicine

## 2023-09-21 DIAGNOSIS — R1013 Epigastric pain: Secondary | ICD-10-CM | POA: Insufficient documentation

## 2023-09-21 MED ORDER — SODIUM CHLORIDE (PF) 0.9 % IJ SOLN
INTRAMUSCULAR | Status: AC
  Filled 2023-09-21: qty 50

## 2023-09-21 MED ORDER — IOHEXOL 300 MG/ML  SOLN
100.0000 mL | Freq: Once | INTRAMUSCULAR | Status: AC | PRN
  Administered 2023-09-21: 100 mL via INTRAVENOUS

## 2023-09-24 ENCOUNTER — Encounter: Payer: Self-pay | Admitting: Internal Medicine

## 2023-09-24 NOTE — Telephone Encounter (Signed)
 Copied from CRM [id number]. Topic: Clinical - Lab/Test Results >> Sep 24, 2023  2:05 PM Higinio Roger wrote: Reason for CRM: Patient is requesting CT scan results. \  Callback #: [phone]

## 2023-09-26 ENCOUNTER — Other Ambulatory Visit: Payer: Self-pay | Admitting: Internal Medicine

## 2023-09-26 NOTE — Progress Notes (Signed)
 Please reach out and ask her how she is feeling today?  Any pain in her abdomen?  Her labs are fine and her CT shows some mild abnormality with her pancreas.  This is most likely not significant or concerning.  Please ask her to eat a bland diet and follow-up with Dr. Okey Dupre or her gastroenterologist if she is having any worsening symptoms

## 2023-09-27 ENCOUNTER — Telehealth: Payer: Self-pay | Admitting: Internal Medicine

## 2023-09-27 NOTE — Telephone Encounter (Signed)
 Copied from CRM [id number]. Topic: Clinical - Lab/Test Results >> Sep 27, 2023  9:24 AM Saverio Danker wrote: Reason for CRM: Patient calling about ct results. Would like a call as soon as possible

## 2023-09-27 NOTE — Telephone Encounter (Signed)
 Patient states that the Protonix has stopped a the hard knot in the front nut is still having alitte bit of discomfort and taking tylenol to help but did have concerns regarding the "abnormality with her pancreas ". Did advise that she can discuss this with Dr Okey Dupre.

## 2023-10-09 ENCOUNTER — Encounter: Payer: Self-pay | Admitting: Internal Medicine

## 2023-10-10 ENCOUNTER — Other Ambulatory Visit: Payer: Self-pay

## 2023-10-10 DIAGNOSIS — E1139 Type 2 diabetes mellitus with other diabetic ophthalmic complication: Secondary | ICD-10-CM

## 2023-10-10 MED ORDER — DAPAGLIFLOZIN PROPANEDIOL 10 MG PO TABS: 10.0000 mg | ORAL_TABLET | Freq: Every day | ORAL | 1 refills | Status: DC

## 2023-10-12 ENCOUNTER — Ambulatory Visit: Admitting: Internal Medicine

## 2023-10-12 ENCOUNTER — Encounter: Payer: Self-pay | Admitting: Internal Medicine

## 2023-10-12 VITALS — BP 120/82 | HR 65 | Temp 98.1°F | Ht 63.0 in | Wt 144.0 lb

## 2023-10-12 DIAGNOSIS — Z7984 Long term (current) use of oral hypoglycemic drugs: Secondary | ICD-10-CM

## 2023-10-12 DIAGNOSIS — C50211 Malignant neoplasm of upper-inner quadrant of right female breast: Secondary | ICD-10-CM

## 2023-10-12 DIAGNOSIS — E785 Hyperlipidemia, unspecified: Secondary | ICD-10-CM

## 2023-10-12 DIAGNOSIS — F331 Major depressive disorder, recurrent, moderate: Secondary | ICD-10-CM

## 2023-10-12 DIAGNOSIS — I10 Essential (primary) hypertension: Secondary | ICD-10-CM

## 2023-10-12 DIAGNOSIS — Z0000 Encounter for general adult medical examination without abnormal findings: Secondary | ICD-10-CM | POA: Diagnosis not present

## 2023-10-12 DIAGNOSIS — H42 Glaucoma in diseases classified elsewhere: Secondary | ICD-10-CM | POA: Diagnosis not present

## 2023-10-12 DIAGNOSIS — E1169 Type 2 diabetes mellitus with other specified complication: Secondary | ICD-10-CM

## 2023-10-12 DIAGNOSIS — Z170 Estrogen receptor positive status [ER+]: Secondary | ICD-10-CM

## 2023-10-12 DIAGNOSIS — E1139 Type 2 diabetes mellitus with other diabetic ophthalmic complication: Secondary | ICD-10-CM | POA: Diagnosis not present

## 2023-10-12 LAB — LIPID PANEL
Cholesterol: 163 mg/dL (ref 0–200)
HDL: 53.8 mg/dL (ref >39.00)
LDL Cholesterol: 89 mg/dL (ref 0–99)
NonHDL: 108.73
Total CHOL/HDL Ratio: 3
Triglycerides: 98 mg/dL (ref 0.0–149.0)
VLDL: 19.6 mg/dL (ref 0.0–40.0)

## 2023-10-12 LAB — COMPREHENSIVE METABOLIC PANEL WITH GFR
ALT: 15 U/L (ref 0–35)
AST: 16 U/L (ref 0–37)
Albumin: 4 g/dL (ref 3.5–5.2)
Alkaline Phosphatase: 83 U/L (ref 39–117)
BUN: 17 mg/dL (ref 6–23)
CO2: 33 meq/L — ABNORMAL HIGH (ref 19–32)
Calcium: 9.2 mg/dL (ref 8.4–10.5)
Chloride: 97 meq/L (ref 96–112)
Creatinine, Ser: 0.83 mg/dL (ref 0.40–1.20)
GFR: 66.71 mL/min (ref >60.00)
Glucose, Bld: 242 mg/dL — ABNORMAL HIGH (ref 70–99)
Potassium: 4.4 meq/L (ref 3.5–5.1)
Sodium: 135 meq/L (ref 135–145)
Total Bilirubin: 0.3 mg/dL (ref 0.2–1.2)
Total Protein: 6.9 g/dL (ref 6.0–8.3)

## 2023-10-12 LAB — HEMOGLOBIN A1C: Hgb A1c MFr Bld: 9.6 % — ABNORMAL HIGH (ref 4.6–6.5)

## 2023-10-12 LAB — CBC
HCT: 40.9 % (ref 36.0–46.0)
Hemoglobin: 13.7 g/dL (ref 12.0–15.0)
MCHC: 33.3 g/dL (ref 30.0–36.0)
MCV: 90.4 fl (ref 78.0–100.0)
Platelets: 237 10*3/uL (ref 150.0–400.0)
RBC: 4.53 Mil/uL (ref 3.87–5.11)
RDW: 14.3 % (ref 11.5–15.5)
WBC: 3.9 10*3/uL — ABNORMAL LOW (ref 4.0–10.5)

## 2023-10-12 LAB — MICROALBUMIN / CREATININE URINE RATIO
Creatinine,U: 92 mg/dL
Microalb Creat Ratio: UNDETERMINED mg/g (ref 0.0–30.0)
Microalb, Ur: <0.7 mg/dL

## 2023-10-12 MED ORDER — ESCITALOPRAM OXALATE 10 MG PO TABS: 10.0000 mg | ORAL_TABLET | Freq: Every day | ORAL | 3 refills | Status: DC

## 2023-10-12 MED ORDER — GLIMEPIRIDE 4 MG PO TABS: 4.0000 mg | ORAL_TABLET | Freq: Every day | ORAL | 1 refills | Status: DC

## 2023-10-12 MED ORDER — LOSARTAN POTASSIUM 100 MG PO TABS: 100.0000 mg | ORAL_TABLET | Freq: Every day | ORAL | 3 refills | Status: DC

## 2023-10-12 MED ORDER — DAPAGLIFLOZIN PROPANEDIOL 10 MG PO TABS: 10.0000 mg | ORAL_TABLET | Freq: Every day | ORAL | 1 refills | Status: DC

## 2023-10-12 MED ORDER — ATORVASTATIN CALCIUM 80 MG PO TABS: 80.0000 mg | ORAL_TABLET | Freq: Every day | ORAL | 3 refills | Status: AC

## 2023-10-12 MED ORDER — TRESIBA FLEXTOUCH 100 UNIT/ML ~~LOC~~ SOPN: 20.0000 [IU] | PEN_INJECTOR | Freq: Every day | SUBCUTANEOUS | 1 refills | Status: DC

## 2023-10-12 NOTE — Assessment & Plan Note (Addendum)
 Uncontrolled and checking HgA1c and UACR and lipid panel and CMP and adjust farxiga  and amaryl  and tresiba . On ARB and statin.

## 2023-10-12 NOTE — Progress Notes (Signed)
   Subjective:   Patient ID: Brooke [name], female    DOB: [birth year], 78 y.o.   MRN: [id number]  HPI The patient is here for physical and also needs follow up of uncontrolled diabetes and follow up abdominal pain (wants to discuss recent CT results)   PMH, [facility], social history reviewed and updated  Review of Systems  Constitutional: Negative.   HENT: Negative.    Eyes: Negative.   Respiratory:  Negative for cough, chest tightness and shortness of breath.   Cardiovascular:  Negative for chest pain, palpitations and leg swelling.  Gastrointestinal:  Negative for abdominal distention, abdominal pain, constipation, diarrhea, nausea and vomiting.  Musculoskeletal: Negative.   Skin: Negative.   Neurological: Negative.   Psychiatric/Behavioral: Negative.      Objective:  Physical Exam Constitutional:      Appearance: She is well-developed.  HENT:     Head: Normocephalic and atraumatic.  Cardiovascular:     Rate and Rhythm: Normal rate and regular rhythm.  Pulmonary:     Effort: Pulmonary effort is normal. No respiratory distress.     Breath sounds: Normal breath sounds. No wheezing or rales.  Abdominal:     General: Bowel sounds are normal. There is no distension.     Palpations: Abdomen is soft.     Tenderness: There is no abdominal tenderness. There is no rebound.  Musculoskeletal:     Cervical back: Normal range of motion.  Skin:    General: Skin is warm and dry.  Neurological:     Mental Status: She is alert and oriented to person, place, and time.     Coordination: Coordination normal.     Vitals:   10/12/23 1043  BP: 120/82  Pulse: 65  Temp: 98.1 F (36.7 C)  TempSrc: Oral  SpO2: 98%  Weight: 144 lb (65.3 kg)  Height: 5\' 3"  (1.6 m)    Assessment & Plan:

## 2023-10-12 NOTE — Assessment & Plan Note (Signed)
Checking lipid panel and adjust statin as needed.  

## 2023-10-12 NOTE — Assessment & Plan Note (Signed)
 BP at goal on losartan

## 2023-10-12 NOTE — Assessment & Plan Note (Signed)
 Flu shot yearly. Pneumonia complete. Shingrix counseled. Tetanus counseled. Colonoscopy up to date. Mammogram up to date, pap smear aged out and dexa up to date. Counseled about sun safety and mole surveillance. Counseled about the dangers of distracted driving. Given 10 year screening recommendations.

## 2023-10-12 NOTE — Assessment & Plan Note (Signed)
 Taking tamoxifen  and up to date on mammogram.

## 2023-10-12 NOTE — Assessment & Plan Note (Signed)
 Taking lexapro  10 mg daily and well controlled. Will continue.

## 2023-10-12 NOTE — Patient Instructions (Signed)
 We will check the labs today.

## 2023-10-22 ENCOUNTER — Encounter: Payer: Self-pay | Admitting: Internal Medicine

## 2023-10-26 ENCOUNTER — Telehealth: Payer: Self-pay | Admitting: Internal Medicine

## 2023-10-29 NOTE — Telephone Encounter (Unsigned)
 Copied from CRM [id number]. Topic: Clinical - Medication Question >> Oct 26, 2023  4:10 PM Albertha Alosa wrote: Reason for CRM: Patient called in regarding [facility] ULTRA test strip , wanted to know the status on them

## 2023-10-31 ENCOUNTER — Other Ambulatory Visit: Payer: Self-pay

## 2023-10-31 ENCOUNTER — Encounter: Payer: Self-pay | Admitting: Internal Medicine

## 2023-10-31 ENCOUNTER — Ambulatory Visit: Payer: Self-pay

## 2023-10-31 MED ORDER — ONETOUCH ULTRA VI STRP: ORAL_STRIP | 2 refills | Status: DC

## 2023-11-01 LAB — HM DIABETES EYE EXAM

## 2023-11-01 NOTE — Telephone Encounter (Signed)
 This was already addressed with the patient and sent in

## 2023-12-17 ENCOUNTER — Telehealth: Payer: Self-pay

## 2023-12-17 ENCOUNTER — Other Ambulatory Visit (HOSPITAL_COMMUNITY): Payer: Self-pay

## 2023-12-17 ENCOUNTER — Encounter: Payer: Self-pay | Admitting: Hematology and Oncology

## 2023-12-17 NOTE — Telephone Encounter (Signed)
 Pharmacy Patient Advocate Encounter  Received notification from [facility] that Prior Authorization for dapagliflozin  propanediol (FARXIGA ) 10 MG TABS tablet has been APPROVED. Ran test claim, Copay is $100.57 for BRAND NAME FARXIGA  AND ALSO PAYS FOR GENERIC DAPAGLIFLOZIN  AT HIGHER COPAY OF $233.02 SEE BOTH TEST CLAIMS BELOW      This test claim was processed through [facility]- copay amounts may vary at other pharmacies due to pharmacy/plan contracts, or as the patient moves through the different stages of their insurance plan.

## 2023-12-19 NOTE — Telephone Encounter (Signed)
 Called and spoke with patient in reagrds to this and patient verbalized she understood and ask that her all her medication be sent over to [facility]

## 2023-12-27 ENCOUNTER — Other Ambulatory Visit: Payer: Self-pay | Admitting: Internal Medicine

## 2023-12-31 ENCOUNTER — Other Ambulatory Visit: Payer: Self-pay | Admitting: Internal Medicine

## 2024-01-14 ENCOUNTER — Other Ambulatory Visit: Payer: Self-pay | Admitting: Internal Medicine

## 2024-01-14 DIAGNOSIS — E1139 Type 2 diabetes mellitus with other diabetic ophthalmic complication: Secondary | ICD-10-CM

## 2024-01-14 NOTE — Telephone Encounter (Signed)
 Copied from CRM [id number]. Topic: Clinical - Medication Refill >> Jan 14, 2024 10:13 AM Sophia H wrote: Medication: dapagliflozin  propanediol (FARXIGA ) 10 MG TABS tablet  Has the patient contacted their pharmacy? Yes, told needs new rx   This is the patient's preferred pharmacy:  [id number] [address], [place] - [address] AT [facility] [address] [place] [address] Phone: [phone] Fax: [phone]   Is this the correct pharmacy for this prescription? Yes If no, delete pharmacy and type the correct one.   Has the prescription been filled recently? Yes  Is the patient out of the medication? Yes  Has the patient been seen for an appointment in the last year OR does the patient have an upcoming appointment? Yes, seen 04/25.  Can we respond through MyChart? Yes  Agent: Please be advised that Rx refills may take up to 3 business days. We ask that you follow-up with your pharmacy.

## 2024-01-15 MED ORDER — DAPAGLIFLOZIN PROPANEDIOL 10 MG PO TABS: 10.0000 mg | ORAL_TABLET | Freq: Every day | ORAL | 1 refills | Status: DC

## 2024-02-08 ENCOUNTER — Telehealth: Payer: Self-pay

## 2024-02-08 NOTE — Telephone Encounter (Signed)
 [facility] Orders Fax sent successfully.

## 2024-02-21 ENCOUNTER — Other Ambulatory Visit: Payer: Self-pay | Admitting: Internal Medicine

## 2024-03-04 ENCOUNTER — Other Ambulatory Visit: Payer: Self-pay | Admitting: Internal Medicine

## 2024-03-12 ENCOUNTER — Other Ambulatory Visit: Payer: Self-pay | Admitting: Internal Medicine

## 2024-04-10 ENCOUNTER — Other Ambulatory Visit: Payer: Self-pay | Admitting: Internal Medicine

## 2024-04-10 DIAGNOSIS — H42 Glaucoma in diseases classified elsewhere: Secondary | ICD-10-CM

## 2024-04-12 ENCOUNTER — Other Ambulatory Visit: Payer: Self-pay | Admitting: Internal Medicine

## 2024-04-23 ENCOUNTER — Encounter: Payer: Self-pay | Admitting: Internal Medicine

## 2024-04-23 ENCOUNTER — Ambulatory Visit: Admitting: Internal Medicine

## 2024-04-23 VITALS — BP 132/70 | HR 73 | Temp 97.8°F | Ht 63.0 in | Wt 143.8 lb

## 2024-04-23 DIAGNOSIS — H42 Glaucoma in diseases classified elsewhere: Secondary | ICD-10-CM | POA: Diagnosis not present

## 2024-04-23 DIAGNOSIS — E1139 Type 2 diabetes mellitus with other diabetic ophthalmic complication: Secondary | ICD-10-CM | POA: Diagnosis not present

## 2024-04-23 DIAGNOSIS — Z7984 Long term (current) use of oral hypoglycemic drugs: Secondary | ICD-10-CM | POA: Diagnosis not present

## 2024-04-23 DIAGNOSIS — Z794 Long term (current) use of insulin: Secondary | ICD-10-CM

## 2024-04-23 LAB — POCT GLYCOSYLATED HEMOGLOBIN (HGB A1C): Hemoglobin A1C: 9.1 % — AB (ref 4.0–5.6)

## 2024-04-23 MED ORDER — PANTOPRAZOLE SODIUM 40 MG PO TBEC: 40.0000 mg | DELAYED_RELEASE_TABLET | Freq: Every day | ORAL | 0 refills | Status: DC

## 2024-04-23 NOTE — Progress Notes (Signed)
 Subjective:   Patient ID: Brooke [name], female    DOB: Apr 23, 1946, 78 y.o.   MRN: [id number]  Discussed the use of AI scribe software for clinical note transcription with the patient, who gave verbal consent to proceed.  History of Present Illness Brooke [name] is an 78 year old female with diabetes who presents for management of elevated A1c levels.  She has a current A1c of 9.1, which is slightly improved but similar to previous levels. Emotional eating, particularly during periods of depression, has been a challenge, especially after experiencing the loss of several people in her life recently. She spent four weeks caring for a friend with idiopathic pulmonary disease, which was emotionally taxing.  Her current diabetes management includes checking her blood sugar every morning, with recent readings around 130 mg/dL, although she notes occasional higher readings after consuming certain foods like apple cinnamon cookies. She takes glimepiride  after breakfast, Farxiga  after lunch, and insulin  at night, with her insulin  dose currently at 20 mL.  She has not been engaging in much physical activity over the past four weeks but enjoys walking to the [facility], which she finds beneficial for her blood sugar control. She has a fondness for fruits and vegetables, particularly grilled and steamed varieties, and is considering reducing mindless snacking.  Review of Systems  Constitutional: Negative.   HENT: Negative.    Eyes: Negative.   Respiratory:  Negative for cough, chest tightness and shortness of breath.   Cardiovascular:  Negative for chest pain, palpitations and leg swelling.  Gastrointestinal:  Negative for abdominal distention, abdominal pain, constipation, diarrhea, nausea and vomiting.  Musculoskeletal: Negative.   Skin: Negative.   Neurological: Negative.   Psychiatric/Behavioral: Negative.      Objective:  Physical Exam Constitutional:      Appearance: She is  well-developed.  HENT:     Head: Normocephalic and atraumatic.  Cardiovascular:     Rate and Rhythm: Normal rate and regular rhythm.  Pulmonary:     Effort: Pulmonary effort is normal. No respiratory distress.     Breath sounds: Normal breath sounds. No wheezing or rales.  Abdominal:     General: Bowel sounds are normal. There is no distension.     Palpations: Abdomen is soft.     Tenderness: There is no abdominal tenderness.  Musculoskeletal:     Cervical back: Normal range of motion.  Skin:    General: Skin is warm and dry.  Neurological:     Mental Status: She is alert and oriented to person, place, and time.     Coordination: Coordination normal.     Vitals:   04/23/24 0941  BP: 132/70  Pulse: 73  Temp: 97.8 F (36.6 C)  TempSrc: Oral  SpO2: 98%  Weight: 143 lb 12.8 oz (65.2 kg)  Height: 5' 3 (1.6 m)    Assessment and Plan Assessment & Plan Type 2 diabetes mellitus   A1c at 9.1 indicates poor glycemic control, possibly due to emotional eating. Morning glucose levels are generally in the 100s, with a recent reading of 130 mg/dL. Current medications include glimepiride , Farxiga , and insulin , with no immediate need for insulin  adjustment. Maintain A1c below 8 to reduce complications. Dietary modifications are encouraged to reduce mindless snacking and identify non-enjoyable foods. Regular physical activity, such as walking to [facility], is recommended. Continue the current medication regimen: glimepiride  after breakfast, Farxiga  after lunch, and insulin  at night. Monitor blood glucose levels daily, especially in the morning. Reassess glycemic control  and medication regimen at the next follow-up in 3 months. She declines recommendation to see a diabetes specialist and declines medication adjustment.

## 2024-04-23 NOTE — Assessment & Plan Note (Signed)
 A1c at 9.1 indicates poor glycemic control, possibly due to emotional eating. Morning glucose levels are generally in the 100s, with a recent reading of 130 mg/dL. Current medications include glimepiride , Farxiga , and insulin , with no immediate need for insulin  adjustment. Maintain A1c below 8 to reduce complications. Dietary modifications are encouraged to reduce mindless snacking and identify non-enjoyable foods. Regular physical activity, such as walking to [facility], is recommended. Continue the current medication regimen: glimepiride  after breakfast, Farxiga  after lunch, and insulin  at night. Monitor blood glucose levels daily, especially in the morning. Reassess glycemic control and medication regimen at the next follow-up in 3 months. She declines recommendation to see a diabetes specialist and declines medication adjustment.

## 2024-04-28 ENCOUNTER — Other Ambulatory Visit: Payer: Self-pay | Admitting: Hematology and Oncology

## 2024-04-28 NOTE — Telephone Encounter (Signed)
 Per last OV continue. Patient has appt on 05/06/2024.  Andrea CHRISTELLA Plunk, RN

## 2024-05-06 ENCOUNTER — Inpatient Hospital Stay: Payer: Federal, State, Local not specified - PPO | Admitting: Hematology and Oncology

## 2024-05-06 LAB — OPHTHALMOLOGY REPORT-SCANNED

## 2024-05-20 LAB — HM MAMMOGRAPHY

## 2024-05-21 ENCOUNTER — Encounter: Payer: Self-pay | Admitting: Internal Medicine

## 2024-05-24 ENCOUNTER — Other Ambulatory Visit: Payer: Self-pay | Admitting: Internal Medicine

## 2024-05-27 ENCOUNTER — Inpatient Hospital Stay: Attending: Hematology and Oncology | Admitting: Hematology and Oncology

## 2024-05-27 VITALS — BP 157/69 | HR 73 | Temp 97.8°F | Resp 18 | Ht 63.0 in | Wt 143.8 lb

## 2024-05-27 DIAGNOSIS — Z170 Estrogen receptor positive status [ER+]: Secondary | ICD-10-CM | POA: Diagnosis not present

## 2024-05-27 DIAGNOSIS — Z86718 Personal history of other venous thrombosis and embolism: Secondary | ICD-10-CM | POA: Insufficient documentation

## 2024-05-27 DIAGNOSIS — Z923 Personal history of irradiation: Secondary | ICD-10-CM | POA: Insufficient documentation

## 2024-05-27 DIAGNOSIS — C50211 Malignant neoplasm of upper-inner quadrant of right female breast: Secondary | ICD-10-CM | POA: Diagnosis present

## 2024-05-27 DIAGNOSIS — Z1731 Human epidermal growth factor receptor 2 positive status: Secondary | ICD-10-CM | POA: Diagnosis not present

## 2024-05-27 DIAGNOSIS — E119 Type 2 diabetes mellitus without complications: Secondary | ICD-10-CM | POA: Diagnosis not present

## 2024-05-27 DIAGNOSIS — Z79811 Long term (current) use of aromatase inhibitors: Secondary | ICD-10-CM | POA: Insufficient documentation

## 2024-05-27 DIAGNOSIS — Z1721 Progesterone receptor positive status: Secondary | ICD-10-CM | POA: Diagnosis not present

## 2024-05-27 DIAGNOSIS — Z79899 Other long term (current) drug therapy: Secondary | ICD-10-CM | POA: Diagnosis not present

## 2024-05-27 NOTE — Assessment & Plan Note (Signed)
 05/05/2021: Right lumpectomy: Dr. Aron: Grade 1 IDC 1.4 cm, margins negative, ER 100%, PR 50%, Ki67 2%, HER2 2+ by IHC, FISH negative, Oncotype DX score 23 07/27/2021: Completed radiation (Patient switched providers from Dr.Feng to myself)   Current treatment: Anastrozole  started 10/17/2021, switched to tamoxifen  November 2023 due to joint pain and stiffness   Tamoxifen  toxicities: Denies any arthralgias or myalgias. Previously patient's diabetes is not under control with A1c of 10.  We discontinued tamoxifen  at that time She also has a prior history of DVT 20 years ago. She changed her diet significantly and her hemoglobin A1c had decreased from 10-8.  She is making a lot of changes to her diet and open to bring the A1c down to 6. Restarted tamoxifen  05/04/2023   Breast cancer surveillance: Mammogram 05/10/2022: Benign breast density category B Breast exam 05/27/2024: Benign    Type 2 Diabetes Mellitus -Encouraged continuation of dietary changes and physical activity.    Return to clinic in 1 year for follow-up

## 2024-05-27 NOTE — Progress Notes (Signed)
 Patient Care Team: Rollene Almarie LABOR, MD as PCP - General (Internal Medicine) Aron Shoulders, MD as Consulting Physician (General Surgery) Lanny Callander, MD as Consulting Physician (Hematology) Dewey Rush, MD as Consulting Physician (Radiation Oncology) Tyree Nanetta SAILOR, RN as Oncology Nurse Navigator Burton, Lacie K, NP as Nurse Practitioner (Nurse Practitioner)  DIAGNOSIS:  Encounter Diagnosis  Name Primary?   Malignant neoplasm of upper-inner quadrant of right breast in female, estrogen receptor positive (HCC) Yes    SUMMARY OF ONCOLOGIC HISTORY: Oncology History Overview Note  Cancer Staging Malignant neoplasm of upper-inner quadrant of right breast in female, estrogen receptor positive (HCC) Staging form: Breast, AJCC 8th Edition - Clinical stage from 04/07/2021: Stage IA (cT1b, cN0, cM0, G1, ER+, PR+, HER2-) - Signed by Lanny Callander, MD on 04/19/2021    Malignant neoplasm of upper-inner quadrant of right breast in female, estrogen receptor positive (HCC)  03/29/2021 Mammogram   Right Diagnostic MM and Right Breast US   There is a 0.6 cm x 0.9 cm x 0.5 cm irregular mass with a spiculated margin in the right breast at 2 o'clock posterior depth 11 cm from the nipple. This irregular mass is hypoechoic with posterior acoustic shadowing. No significant abnormalities were seen sonographically in the right axilla.   04/07/2021 Cancer Staging   Staging form: Breast, AJCC 8th Edition - Clinical stage from 04/07/2021: Stage IA (cT1b, cN0, cM0, G1, ER+, PR+, HER2-) - Signed by Lanny Callander, MD on 04/19/2021 Stage prefix: Initial diagnosis Histologic grading system: 3 grade system   04/07/2021 Pathology Results   Diagnosis Breast, right, needle core biopsy, 2:00 o'clock 11cmfn mass - INVASIVE DUCTAL CARCINOMA - FIBROADENOMATOID NODULE WITH CALCIFICATIONS - SEE COMMENT Microscopic Comment Based on the biopsy, the carcinoma appears Nottingham grade 1 of 3 and measures 0.4 cm in greatest  linear extent.  PROGNOSTIC INDICATORS Results: The tumor cells are EQUIVOCAL for Her2 (2+). Her2 by FISH will be performed and results reported separately. Estrogen Receptor: 100%, POSITIVE, STRONG STAINING INTENSITY Progesterone Receptor: 50%, POSITIVE, MODERATE STAINING INTENSITY Proliferation Marker Ki67: 2%  FLUORESCENCE IN-SITU HYBRIDIZATION Results: GROUP 4: HER2 **NEGATIVE**   04/15/2021 Initial Diagnosis   Malignant neoplasm of upper-inner quadrant of right breast in female, estrogen receptor positive (HCC)   05/05/2021 Cancer Staging   Staging form: Breast, AJCC 8th Edition - Pathologic stage from 05/05/2021: Stage Unknown (pT1c, pNX, cM0, G1, ER+, PR+, HER2-, Oncotype DX score: 23) - Signed by Lanny Callander, MD on 08/26/2021 Stage prefix: Initial diagnosis Multigene prognostic tests performed: Oncotype DX Recurrence score range: Greater than or equal to 11 Histologic grading system: 3 grade system Residual tumor (R): R0 - None   05/05/2021 Definitive Surgery   FINAL MICROSCOPIC DIAGNOSIS:   A. BREAST, RIGHT, LUMPECTOMY:  -  Invasive ductal carcinoma, Nottingham grade 1 of 3, 1.4 cm  -  Margins uninvolved by carcinoma (<0.1cm; inferior margin; see part B)  -  Previous biopsy site changes present  -  See oncology table below   B. BREAST, RIGHT INFERIOR MARGIN, EXCISION:  -  No residual carcinoma identified   C. BREAST, RIGHT MEDIAL MARGIN, EXCISION:  -  No residual carcinoma identified    07/28/2021 - 08/24/2021 Radiation Therapy   Site Technique Total Dose (Gy) Dose per Fx (Gy) Completed Fx Beam Energies  Breast, Right: Breast_R 3D 42.56/42.56 2.66 16/16 6X, 10X  Breast, Right: Breast_R_Bst 3D 10/10 2.5 4/4 6X, 10X     10/17/2021 -  Anti-estrogen oral therapy   Adjuvant anti-estrogen therapy with Anastrozole   12/12/2021 Survivorship   SCP delivered by Lacie Burton, NP     CHIEF COMPLIANT: Surveillance of breast cancer  HISTORY OF PRESENT ILLNESS:  History of  Present Illness Brooke [name] is an 78 year old female with stage IA ER+/PR+, HER2- right breast invasive ductal carcinoma, status post lumpectomy and adjuvant radiation, presenting for routine oncology follow-up.  She was treated with lumpectomy and adjuvant 3D conformal radiation (42.56 Gy in 16 fractions) completed in March 2023. She has received about three years of adjuvant endocrine therapy, initially with anastrozole , which was stopped due to arthralgias and stiffness, and is now on tamoxifen . She had a brief interruption in tamoxifen  but has resumed and is tolerating it without adverse effects. She denies new breast symptoms and feels well overall.  Her type 2 diabetes is suboptimally controlled with a recent hemoglobin A1c of 9.2%, which she relates in part to dietary challenges. She reports no acute diabetes complications relevant to today's visit.  Her blood pressure has been mildly elevated without symptoms. She reports significant caregiving stress for her hospitalized, wheelchair-bound sister, which she feels is affecting her ability to manage her chronic conditions.  May 04, 2023: Follow-up for breast cancer surveillance and tamoxifen  therapy; benign breast exam, restarted tamoxifen  20 mg daily after improved glycemic control. No evidence of recurrence, diabetes management discussed, and no tamoxifen -related arthralgias or myalgias reported.     ALLERGIES:  is allergic to codeine.  MEDICATIONS:  Current Outpatient Medications  Medication Sig Dispense Refill   albuterol  (VENTOLIN  HFA) 108 (90 Base) MCG/ACT inhaler INHALE 2 PUFFS INTO THE LUNGS EVERY 6 HOURS AS NEEDED 25.5 g 11   ALPRAZolam  (XANAX ) 0.5 MG tablet TAKE 1/2 TABLET(0.25 MG) BY MOUTH DAILY AS NEEDED FOR ANXIETY 30 tablet 1   atorvastatin  (LIPITOR) 80 MG tablet Take 1 tablet (80 mg total) by mouth daily. 90 tablet 3   BD PEN NEEDLE NANO 2ND GEN 32G X 4 MM MISC USE TO INJECT INSULIN  AS DIRECTED 100 each 2   blood  glucose meter kit and supplies Dispense based on patient and insurance preference. Use up to four times daily as directed. (FOR ICD-10 E10.9, E11.9). 1 each 0   carboxymethylcellulose (REFRESH PLUS) 0.5 % SOLN Place 1 drop into both eyes daily as needed (dry eyes).     escitalopram  (LEXAPRO ) 10 MG tablet Take 1 tablet (10 mg total) by mouth daily. 90 tablet 3   FARXIGA  10 MG TABS tablet TAKE 1 TABLET(10 MG) BY MOUTH DAILY 90 tablet 1   glimepiride  (AMARYL ) 4 MG tablet TAKE 1 TABLET(4 MG) BY MOUTH DAILY WITH BREAKFAST 90 tablet 1   latanoprost (XALATAN) 0.005 % ophthalmic solution 1 drop in both eyes at bedtime  0   losartan  (COZAAR ) 100 MG tablet Take 1 tablet (100 mg total) by mouth daily. 90 tablet 3   Multiple Vitamin (MULTIVITAMIN WITH MINERALS) TABS tablet Take 1 tablet by mouth daily.     ONETOUCH ULTRA test strip USE TO TEST AS DIRECTED FOUR TIMES DAILY 100 strip 2   pantoprazole  (PROTONIX ) 40 MG tablet Take 1 tablet (40 mg total) by mouth daily. 90 tablet 0   tamoxifen  (NOLVADEX ) 20 MG tablet TAKE 1 TABLET(20 MG) BY MOUTH DAILY 90 tablet 3   TRESIBA  FLEXTOUCH 100 UNIT/ML FlexTouch Pen ADMINISTER 20 UNITS UNDER THE SKIN DAILY 18 mL 1   No current facility-administered medications for this visit.    PHYSICAL EXAMINATION: ECOG PERFORMANCE STATUS: 1 - Symptomatic but completely ambulatory  Vitals:   05/27/24  1300  BP: (!) 157/69  Pulse: 73  Resp: 18  Temp: 97.8 F (36.6 C)  SpO2: 99%   Filed Weights   05/27/24 1300  Weight: 143 lb 12.8 oz (65.2 kg)    Physical Exam  Breast exam: No lumps or nodules  (exam performed in the presence of a chaperone)  LABORATORY DATA:  I have reviewed the data as listed    Latest Ref Rng & Units 10/12/2023   11:46 AM 09/19/2023    2:22 PM 05/04/2023    9:11 AM  CMP  Glucose 70 - 99 mg/dL 757  810  855   BUN 6 - 23 mg/dL 17  20  15    Creatinine 0.40 - 1.20 mg/dL 9.16  9.04  9.20   Sodium 135 - 145 mEq/L 135  135  138   Potassium 3.5 - 5.1  mEq/L 4.4  4.5  4.0   Chloride 96 - 112 mEq/L 97  98  103   CO2 19 - 32 mEq/L 33  29  31   Calcium  8.4 - 10.5 mg/dL 9.2  9.7  8.8   Total Protein 6.0 - 8.3 g/dL 6.9  7.5  6.8   Total Bilirubin 0.2 - 1.2 mg/dL 0.3  0.4  0.4   Alkaline Phos 39 - 117 U/L 83  78  80   AST 0 - 37 U/L 16  16  20    ALT 0 - 35 U/L 15  16  17      Lab Results  Component Value Date   WBC 3.9 (L) 10/12/2023   HGB 13.7 10/12/2023   HCT 40.9 10/12/2023   MCV 90.4 10/12/2023   PLT 237.0 10/12/2023   NEUTROABS 3.4 09/19/2023    ASSESSMENT & PLAN:  Malignant neoplasm of upper-inner quadrant of right breast in female, estrogen receptor positive (HCC) 05/05/2021: Right lumpectomy: Dr. Aron: Grade 1 IDC 1.4 cm, margins negative, ER 100%, PR 50%, Ki67 2%, HER2 2+ by IHC, FISH negative, Oncotype DX score 23 07/27/2021: Completed radiation (Patient switched providers from Dr.Feng to myself)   Current treatment: Anastrozole  started 10/17/2021, switched to tamoxifen  November 2023 due to joint pain and stiffness   Tamoxifen  toxicities: Denies any arthralgias or myalgias. Previously patient's diabetes is not under control with A1c of 10.  We discontinued tamoxifen  at that time She also has a prior history of DVT 20 years ago. She changed her diet significantly and her hemoglobin A1c had decreased from 10-8.  She is making a lot of changes to her diet and open to bring the A1c down to 6. Restarted tamoxifen  05/04/2023   Breast cancer surveillance: Mammogram 05/10/2022: Benign breast density category B Breast exam 05/27/2024: Benign    Type 2 Diabetes Mellitus -Encouraged continuation of dietary changes and physical activity.    Return to clinic in 1 year for follow-up ------------------------------------- Assessment and Plan Assessment & Plan Estrogen receptor positive malignant neoplasm of upper-inner quadrant of right breast In remission post-lumpectomy, radiation, and tamoxifen . No recurrence on exam and  mammogram. Tolerating tamoxifen  well. Excellent prognosis due to early stage, low grade, and favorable biomarkers. Ongoing surveillance for late effects needed. - Continue tamoxifen  for 2 more years (total 5 years). - Annual breast exam and mammogram. - Monitor for late effects: deep vein thrombosis, endometrial symptoms, bone health. - Follow up in 1 year for surveillance.  Type 2 diabetes mellitus Suboptimal glycemic control with hemoglobin A1c of 9.2%. Motivated for dietary improvement. Endocrine therapy may affect glycemic control. - Monitor  hemoglobin A1c and glycemic control closely during tamoxifen  therapy. - Continue dietary counseling and lifestyle modification support. - Coordinate diabetes management with oncology visits.      No orders of the defined types were placed in this encounter.  The patient has a good understanding of the overall plan. she agrees with it. she will call with any problems that may develop before the next visit here.  I personally spent a total of 30 minutes in the care of the patient today including preparing to see the patient, getting/reviewing separately obtained history, performing a medically appropriate exam/evaluation, counseling and educating, placing orders, referring and communicating with other health care professionals, documenting clinical information in the EHR, independently interpreting results, communicating results, and coordinating care.   Viinay K Yu Cragun, MD 05/27/24

## 2024-06-03 ENCOUNTER — Encounter (HOSPITAL_BASED_OUTPATIENT_CLINIC_OR_DEPARTMENT_OTHER): Payer: Self-pay

## 2024-06-16 ENCOUNTER — Other Ambulatory Visit: Payer: Self-pay | Admitting: Internal Medicine

## 2024-07-10 ENCOUNTER — Ambulatory Visit: Payer: Federal, State, Local not specified - PPO | Admitting: Internal Medicine

## 2024-07-25 ENCOUNTER — Ambulatory Visit: Admitting: Internal Medicine

## 2024-07-25 ENCOUNTER — Encounter: Payer: Self-pay | Admitting: Internal Medicine

## 2024-07-25 VITALS — BP 130/80 | HR 102 | Temp 97.8°F | Ht 63.0 in | Wt 143.4 lb

## 2024-07-25 DIAGNOSIS — F331 Major depressive disorder, recurrent, moderate: Secondary | ICD-10-CM

## 2024-07-25 DIAGNOSIS — E1169 Type 2 diabetes mellitus with other specified complication: Secondary | ICD-10-CM

## 2024-07-25 DIAGNOSIS — I10 Essential (primary) hypertension: Secondary | ICD-10-CM

## 2024-07-25 DIAGNOSIS — E1139 Type 2 diabetes mellitus with other diabetic ophthalmic complication: Secondary | ICD-10-CM

## 2024-07-25 LAB — HEMOGLOBIN A1C: Hgb A1c MFr Bld: 9.4 % — ABNORMAL HIGH (ref 4.6–6.5)

## 2024-07-25 LAB — LIPID PANEL
Cholesterol: 133 mg/dL (ref 28–200)
HDL: 41 mg/dL
LDL Cholesterol: 73 mg/dL (ref 10–99)
NonHDL: 92.33
Total CHOL/HDL Ratio: 3
Triglycerides: 96 mg/dL (ref 10.0–149.0)
VLDL: 19.2 mg/dL (ref 0.0–40.0)

## 2024-07-25 LAB — MICROALBUMIN / CREATININE URINE RATIO
Creatinine,U: 77.3 mg/dL
Microalb Creat Ratio: UNDETERMINED mg/g (ref 0.0–30.0)
Microalb, Ur: <0.7 mg/dL

## 2024-07-25 LAB — COMPREHENSIVE METABOLIC PANEL WITH GFR
ALT: 16 U/L (ref 3–35)
AST: 20 U/L (ref 5–37)
Albumin: 4.1 g/dL (ref 3.5–5.2)
Alkaline Phosphatase: 73 U/L (ref 39–117)
BUN: 15 mg/dL (ref 6–23)
CO2: 30 meq/L (ref 19–32)
Calcium: 9 mg/dL (ref 8.4–10.5)
Chloride: 104 meq/L (ref 96–112)
Creatinine, Ser: 0.81 mg/dL (ref 0.40–1.20)
GFR: 68.31 mL/min
Glucose, Bld: 98 mg/dL (ref 70–99)
Potassium: 4.3 meq/L (ref 3.5–5.1)
Sodium: 140 meq/L (ref 135–145)
Total Bilirubin: 0.3 mg/dL (ref 0.2–1.2)
Total Protein: 6.9 g/dL (ref 6.0–8.3)

## 2024-07-25 LAB — CBC
HCT: 41 % (ref 36.0–46.0)
Hemoglobin: 13.9 g/dL (ref 12.0–15.0)
MCHC: 33.8 g/dL (ref 30.0–36.0)
MCV: 90.3 fl (ref 78.0–100.0)
Platelets: 252 10*3/uL (ref 150.0–400.0)
RBC: 4.54 Mil/uL (ref 3.87–5.11)
RDW: 14 % (ref 11.5–15.5)
WBC: 4.5 10*3/uL (ref 4.0–10.5)

## 2024-07-25 NOTE — Assessment & Plan Note (Signed)
 BP is borderline today and will continue close monitoring and adjust medication if needed.

## 2024-07-25 NOTE — Assessment & Plan Note (Signed)
 Checking lipid panel and adjust as needed.

## 2024-07-25 NOTE — Progress Notes (Signed)
" ° °  Subjective:   Patient ID: Brooke [name], female    DOB: [birth year], 79 y.o.   MRN: [id number]  Discussed the use of AI scribe software for clinical note transcription with the patient, who gave verbal consent to proceed.  History of Present Illness Brooke [name] is an 79 year old female with diabetes and sarcoidosis who presents for medication management and follow-up.  Over the past week, she has been confined at home due to snow, which has made managing her diabetes more challenging. Her blood sugar readings have remained below 200 mg/dL, but her last J8r was 0.8%. She attributes the difficulty in managing her diabetes to personal challenges rather than medication issues, acknowledging the need for better discipline.  She is experiencing issues with her medication Farxiga , noting a change in cost to $83, which she attributes to changes in her insurance coverage. She has been following the procedure of bringing insurance forms to the office for the past two to three years.  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 She is scheduled to see a new pulmonary doctor next week after her long-term pulmonologist retired.  She reports persistent heartburn, for which she regularly takes Pepcid. No new chest pain, tightness, or pressure.  She confirms regular visits to the eye doctor.  Review of Systems  Constitutional: Negative.   HENT: Negative.    Eyes: Negative.   Respiratory:  Negative for cough, chest tightness and shortness of breath.   Cardiovascular:  Negative for chest pain, palpitations and leg swelling.  Gastrointestinal:  Negative for abdominal distention, abdominal pain, constipation, diarrhea, nausea and vomiting.  Musculoskeletal: Negative.   Skin: Negative.   Neurological: Negative.   Psychiatric/Behavioral: Negative.      Objective:  Physical Exam Constitutional:      Appearance: She is  well-developed.  HENT:     Head: Normocephalic and atraumatic.  Cardiovascular:     Rate and Rhythm: Normal rate and regular rhythm.  Pulmonary:     Effort: Pulmonary effort is normal. No respiratory distress.     Breath sounds: Normal breath sounds. No wheezing or rales.  Abdominal:     General: Bowel sounds are normal. There is no distension.     Palpations: Abdomen is soft.     Tenderness: There is no abdominal tenderness.  Musculoskeletal:     Cervical back: Normal range of motion.  Skin:    General: Skin is warm and dry.  Neurological:     Mental Status: She is alert and oriented to person, place, and time.     Coordination: Coordination normal.     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 Blood glucose management remains challenging as she prefers dietary discipline over medication changes. Regular ophthalmic monitoring is ongoing. Ordered blood work for A1c and lipid panel and CMP and UACR. Continue Glimepiride , Farxiga , and Insulin . Encouraged medication change and risk of complications but she prefers only to use dietary changes and does not want dose or medicine adjustment. Declines talking to pharmacist or returning to endo. Continue regular ophthalmic follow-ups.   "

## 2024-07-25 NOTE — Assessment & Plan Note (Signed)
 Type 2 diabetes with suboptimal glycemic control, A1c 9.1. Blood glucose management remains challenging as she prefers dietary discipline over medication changes. Regular ophthalmic monitoring is ongoing. Ordered blood work for A1c and lipid panel and CMP and UACR. Continue Glimepiride , Farxiga , and Insulin . Encouraged medication change and risk of complications but she prefers only to use dietary changes and does not want dose or medicine adjustment. Declines talking to pharmacist or returning to endo. Continue regular ophthalmic follow-ups.

## 2024-07-25 NOTE — Assessment & Plan Note (Signed)
 Taking lexapro  10 mg daily and some worsening stress has caused more stress eating over the last 6 months. Does not have need to change medicine today.

## 2024-07-29 ENCOUNTER — Encounter: Admitting: Pulmonary Disease

## 2025-05-27 ENCOUNTER — Inpatient Hospital Stay: Admitting: Hematology and Oncology
